# Patient Record
Sex: Female | Born: 1955 | Race: Black or African American | Hispanic: No | Marital: Single | Smoking: Never smoker
Health system: Southern US, Community
[De-identification: ages and names within clinical notes are randomized; demographics above are authoritative.]

## PROBLEM LIST (undated history)

## (undated) DIAGNOSIS — F329 Major depressive disorder, single episode, unspecified: Secondary | ICD-10-CM

## (undated) DIAGNOSIS — Z803 Family history of malignant neoplasm of breast: Secondary | ICD-10-CM

## (undated) DIAGNOSIS — I10 Essential (primary) hypertension: Secondary | ICD-10-CM

## (undated) DIAGNOSIS — F419 Anxiety disorder, unspecified: Secondary | ICD-10-CM

## (undated) DIAGNOSIS — R251 Tremor, unspecified: Secondary | ICD-10-CM

## (undated) DIAGNOSIS — Z801 Family history of malignant neoplasm of trachea, bronchus and lung: Secondary | ICD-10-CM

## (undated) DIAGNOSIS — Z87442 Personal history of urinary calculi: Secondary | ICD-10-CM

## (undated) DIAGNOSIS — Z8679 Personal history of other diseases of the circulatory system: Secondary | ICD-10-CM

## (undated) DIAGNOSIS — F410 Panic disorder [episodic paroxysmal anxiety] without agoraphobia: Secondary | ICD-10-CM

## (undated) DIAGNOSIS — C801 Malignant (primary) neoplasm, unspecified: Secondary | ICD-10-CM

## (undated) HISTORY — DX: Tremor, unspecified: R25.1

## (undated) HISTORY — PX: CHOLECYSTECTOMY: SHX55

## (undated) HISTORY — DX: Major depressive disorder, single episode, unspecified: F32.9

## (undated) HISTORY — DX: Essential (primary) hypertension: I10

## (undated) HISTORY — PX: TONSILLECTOMY: SUR1361

## (undated) HISTORY — PX: OTHER SURGICAL HISTORY: SHX169

## (undated) HISTORY — PX: APPENDECTOMY: SHX54

## (undated) HISTORY — DX: Panic disorder (episodic paroxysmal anxiety): F41.0

## (undated) HISTORY — DX: Family history of malignant neoplasm of trachea, bronchus and lung: Z80.1

## (undated) HISTORY — DX: Family history of malignant neoplasm of breast: Z80.3

## (undated) HISTORY — DX: Anxiety disorder, unspecified: F41.9

---

## 1993-10-13 DIAGNOSIS — F419 Anxiety disorder, unspecified: Secondary | ICD-10-CM

## 1993-10-13 DIAGNOSIS — F32A Depression, unspecified: Secondary | ICD-10-CM

## 1993-10-13 DIAGNOSIS — F410 Panic disorder [episodic paroxysmal anxiety] without agoraphobia: Secondary | ICD-10-CM

## 1993-10-13 HISTORY — DX: Depression, unspecified: F32.A

## 1993-10-13 HISTORY — DX: Panic disorder (episodic paroxysmal anxiety): F41.0

## 1993-10-13 HISTORY — DX: Anxiety disorder, unspecified: F41.9

## 2011-03-07 ENCOUNTER — Emergency Department (HOSPITAL_COMMUNITY): Payer: Self-pay

## 2011-03-07 ENCOUNTER — Emergency Department (HOSPITAL_COMMUNITY)
Admission: EM | Admit: 2011-03-07 | Discharge: 2011-03-08 | Disposition: A | Discharge status: Home / Self Care | Payer: Self-pay | Attending: Emergency Medicine | Admitting: Emergency Medicine

## 2011-03-07 DIAGNOSIS — N390 Urinary tract infection, site not specified: Secondary | ICD-10-CM | POA: Insufficient documentation

## 2011-03-07 DIAGNOSIS — R1011 Right upper quadrant pain: Secondary | ICD-10-CM | POA: Insufficient documentation

## 2011-03-07 DIAGNOSIS — R112 Nausea with vomiting, unspecified: Secondary | ICD-10-CM | POA: Insufficient documentation

## 2011-03-07 DIAGNOSIS — K5730 Diverticulosis of large intestine without perforation or abscess without bleeding: Secondary | ICD-10-CM | POA: Insufficient documentation

## 2011-03-07 DIAGNOSIS — Z87442 Personal history of urinary calculi: Secondary | ICD-10-CM | POA: Insufficient documentation

## 2011-03-07 DIAGNOSIS — K7689 Other specified diseases of liver: Secondary | ICD-10-CM | POA: Insufficient documentation

## 2011-03-07 LAB — CBC
HCT: 43.8 % (ref 36.0–46.0)
Hemoglobin: 14.5 g/dL (ref 12.0–15.0)
MCV: 89.8 fL (ref 78.0–100.0)
RBC: 4.88 MIL/uL (ref 3.87–5.11)
WBC: 11.4 10*3/uL — ABNORMAL HIGH (ref 4.0–10.5)

## 2011-03-07 LAB — URINALYSIS, ROUTINE W REFLEX MICROSCOPIC
Bilirubin Urine: NEGATIVE
Glucose, UA: NEGATIVE mg/dL
Nitrite: NEGATIVE
Specific Gravity, Urine: 1.015 (ref 1.005–1.030)
pH: 5.5 (ref 5.0–8.0)

## 2011-03-07 LAB — URINE MICROSCOPIC-ADD ON

## 2011-03-07 LAB — DIFFERENTIAL
Basophils Absolute: 0 10*3/uL (ref 0.0–0.1)
Lymphocytes Relative: 11 % — ABNORMAL LOW (ref 12–46)
Lymphs Abs: 1.2 10*3/uL (ref 0.7–4.0)
Neutro Abs: 9.6 10*3/uL — ABNORMAL HIGH (ref 1.7–7.7)

## 2011-03-07 LAB — BASIC METABOLIC PANEL
CO2: 25 mEq/L (ref 19–32)
Chloride: 103 mEq/L (ref 96–112)
GFR calc non Af Amer: >60 mL/min (ref >60)
Glucose, Bld: 118 mg/dL — ABNORMAL HIGH (ref 70–99)
Potassium: 4.5 mEq/L (ref 3.5–5.1)
Sodium: 140 mEq/L (ref 135–145)

## 2011-03-10 LAB — URINE CULTURE

## 2014-08-03 ENCOUNTER — Encounter (HOSPITAL_COMMUNITY): Payer: Self-pay | Admitting: Emergency Medicine

## 2014-08-03 ENCOUNTER — Emergency Department (HOSPITAL_COMMUNITY)
Admission: EM | Admit: 2014-08-03 | Discharge: 2014-08-03 | Disposition: A | Discharge status: Home / Self Care | Payer: Self-pay | Attending: Emergency Medicine | Admitting: Emergency Medicine

## 2014-08-03 DIAGNOSIS — F419 Anxiety disorder, unspecified: Secondary | ICD-10-CM | POA: Insufficient documentation

## 2014-08-03 DIAGNOSIS — K625 Hemorrhage of anus and rectum: Secondary | ICD-10-CM | POA: Insufficient documentation

## 2014-08-03 DIAGNOSIS — R000 Tachycardia, unspecified: Secondary | ICD-10-CM | POA: Insufficient documentation

## 2014-08-03 DIAGNOSIS — Z79899 Other long term (current) drug therapy: Secondary | ICD-10-CM | POA: Insufficient documentation

## 2014-08-03 LAB — COMPREHENSIVE METABOLIC PANEL
ALT: 14 U/L (ref 0–35)
ANION GAP: 15 (ref 5–15)
AST: 18 U/L (ref 0–37)
Albumin: 4.3 g/dL (ref 3.5–5.2)
Alkaline Phosphatase: 79 U/L (ref 39–117)
BUN: 14 mg/dL (ref 6–23)
CALCIUM: 9.9 mg/dL (ref 8.4–10.5)
CO2: 22 meq/L (ref 19–32)
CREATININE: 0.87 mg/dL (ref 0.50–1.10)
Chloride: 103 mEq/L (ref 96–112)
GFR, EST AFRICAN AMERICAN: 83 mL/min — AB (ref >90)
GFR, EST NON AFRICAN AMERICAN: 72 mL/min — AB (ref >90)
GLUCOSE: 106 mg/dL — AB (ref 70–99)
Potassium: 4.3 mEq/L (ref 3.7–5.3)
SODIUM: 140 meq/L (ref 137–147)
TOTAL PROTEIN: 8.3 g/dL (ref 6.0–8.3)
Total Bilirubin: 0.4 mg/dL (ref 0.3–1.2)

## 2014-08-03 LAB — CBC WITH DIFFERENTIAL/PLATELET
BASOS ABS: 0 10*3/uL (ref 0.0–0.1)
BASOS PCT: 0 % (ref 0–1)
EOS ABS: 0.1 10*3/uL (ref 0.0–0.7)
Eosinophils Relative: 1 % (ref 0–5)
HCT: 43.8 % (ref 36.0–46.0)
HEMOGLOBIN: 15 g/dL (ref 12.0–15.0)
Lymphocytes Relative: 23 % (ref 12–46)
Lymphs Abs: 1.8 10*3/uL (ref 0.7–4.0)
MCH: 30.5 pg (ref 26.0–34.0)
MCHC: 34.2 g/dL (ref 30.0–36.0)
MCV: 89.2 fL (ref 78.0–100.0)
MONOS PCT: 7 % (ref 3–12)
Monocytes Absolute: 0.5 10*3/uL (ref 0.1–1.0)
NEUTROS ABS: 5.4 10*3/uL (ref 1.7–7.7)
NEUTROS PCT: 69 % (ref 43–77)
PLATELETS: 420 10*3/uL — AB (ref 150–400)
RBC: 4.91 MIL/uL (ref 3.87–5.11)
RDW: 13.3 % (ref 11.5–15.5)
WBC: 7.9 10*3/uL (ref 4.0–10.5)

## 2014-08-03 LAB — TYPE AND SCREEN
ABO/RH(D): B NEG
ANTIBODY SCREEN: NEGATIVE

## 2014-08-03 LAB — PROTIME-INR
INR: 1.02 (ref 0.00–1.49)
PROTHROMBIN TIME: 13.5 s (ref 11.6–15.2)

## 2014-08-03 LAB — POC OCCULT BLOOD, ED: FECAL OCCULT BLD: POSITIVE — AB

## 2014-08-03 LAB — ABO/RH: ABO/RH(D): B NEG

## 2014-08-03 NOTE — ED Notes (Signed)
Pt c/o BRB rectal bleeding x 1 episode this am; pt denies pain

## 2014-08-03 NOTE — ED Provider Notes (Signed)
Medical screening examination/treatment/procedure(s) were performed by non-physician practitioner and as supervising physician I was immediately available for consultation/collaboration.   EKG Interpretation None        Hoy Morn, MD 08/03/14 1729

## 2014-08-03 NOTE — Discharge Instructions (Signed)
Bloody Stools  Bloody stools often mean that there is a problem in the digestive tract. Your caregiver may use the term "melena" to describe black, tarry, and bad smelling stools or "hematochezia" to describe red or maroon-colored stools. Blood seen in the stool can be caused by bleeding anywhere along the intestinal tract.   A black stool usually means that blood is coming from the upper part of the gastrointestinal tract (esophagus, stomach, or small bowel). Passing maroon-colored stools or bright red blood usually means that blood is coming from lower down in the large bowel or the rectum. However, sometimes massive bleeding in the stomach or small intestine can cause bright red bloody stools.   Consuming black licorice, lead, iron pills, medicines containing bismuth subsalicylate, or blueberries can also cause black stools. Your caregiver can test black stools to see if blood is present.  It is important that the cause of the bleeding be found. Treatment can then be started, and the problem can be corrected. Rectal bleeding may not be serious, but you should not assume everything is okay until you know the cause. It is very important to follow up with your caregiver or a specialist in gastrointestinal problems.  CAUSES   Blood in the stools can come from various underlying causes. Often, the cause is not found during your first visit. Testing is often needed to discover the cause of bleeding in the gastrointestinal tract. Causes range from simple to serious or even life-threatening. Possible causes include:  · Hemorrhoids. These are veins that are full of blood (engorged) in the rectum. They cause pain, inflammation, and may bleed.  · Anal fissures. These are areas of painful tearing which may bleed. They are often caused by passing hard stool.  · Diverticulosis. These are pouches that form on the colon over time, with age, and may bleed significantly.  · Diverticulitis. This is inflammation in areas with  diverticulosis. It can cause pain, fever, and bloody stools, although bleeding is rare.  · Proctitis and colitis. These are inflamed areas of the rectum or colon. They may cause pain, fever, and bloody stools.  · Polyps and cancer. Colon cancer is a leading cause of preventable cancer death. It often starts out as precancerous polyps that can be removed during a colonoscopy, preventing progression into cancer. Sometimes, polyps and cancer may cause rectal bleeding.  · Gastritis and ulcers. Bleeding from the upper gastrointestinal tract (near the stomach) may travel through the intestines and produce black, sometimes tarry, often bad smelling stools. In certain cases, if the bleeding is fast enough, the stools may not be black, but red and the condition may be life-threatening.  SYMPTOMS   You may have stools that are bright red and bloody, that are normal color with blood on them, or that are dark black and tarry. In some cases, you may only have blood in the toilet bowl. Any of these cases need medical care. You may also have:  · Pain at the anus or anywhere in the rectum.  · Lightheadedness or feeling faint.  · Extreme weakness.  · Nausea or vomiting.  · Fever.  DIAGNOSIS  Your caregiver may use the following methods to find the cause of your bleeding:  · Taking a medical history. Age is important. Older people tend to develop polyps and cancer more often. If there is anal pain and a hard, large stool associated with bleeding, a tear of the anus may be the cause. If blood drips into the toilet after a bowel movement, bleeding hemorrhoids may be the   problem. The color and frequency of the bleeding are additional considerations. In most cases, the medical history provides clues, but seldom the final answer.  · A visual and finger (digital) exam. Your caregiver will inspect the anal area, looking for tears and hemorrhoids. A finger exam can provide information when there is tenderness or a growth inside. In men, the  prostate is also examined.  · Endoscopy. Several types of small, long scopes (endoscopes) are used to view the colon.  ¨ In the office, your caregiver may use a rigid, or more commonly, a flexible viewing sigmoidoscope. This exam is called flexible sigmoidoscopy. It is performed in 5 to 10 minutes.  ¨ A more thorough exam is accomplished with a colonoscope. It allows your caregiver to view the entire 5 to 6 foot long colon. Medicine to help you relax (sedative) is usually given for this exam. Frequently, a bleeding lesion may be present beyond the reach of the sigmoidoscope. So, a colonoscopy may be the best exam to start with. Both exams are usually done on an outpatient basis. This means the patient does not stay overnight in the hospital or surgery center.  ¨ An upper endoscopy may be needed to examine your stomach. Sedation is used and a flexible endoscope is put in your mouth, down to your stomach.  · A barium enema X-ray. This is an X-ray exam. It uses liquid barium inserted by enema into the rectum. This test alone may not identify an actual bleeding point. X-rays highlight abnormal shadows, such as those made by lumps (tumors), diverticuli, or colitis.  TREATMENT   Treatment depends on the cause of your bleeding.   · For bleeding from the stomach or colon, the caregiver doing your endoscopy or colonoscopy may be able to stop the bleeding as part of the procedure.  · Inflammation or infection of the colon can be treated with medicines.  · Many rectal problems can be treated with creams, suppositories, or warm baths.  · Surgery is sometimes needed.  · Blood transfusions are sometimes needed if you have lost a lot of blood.  · For any bleeding problem, let your caregiver know if you take aspirin or other blood thinners regularly.  HOME CARE INSTRUCTIONS   · Take any medicines exactly as prescribed.  · Keep your stools soft by eating a diet high in fiber. Prunes (1 to 3 a day) work well for many people.  · Drink  enough water and fluids to keep your urine clear or pale yellow.  · Take sitz baths if advised. A sitz bath is when you sit in a bathtub with warm water for 10 to 15 minutes to soak, soothe, and cleanse the rectal area.  · If enemas or suppositories are advised, be sure you know how to use them. Tell your caregiver if you have problems with this.  · Monitor your bowel movements to look for signs of improvement or worsening.  SEEK MEDICAL CARE IF:   · You do not improve in the time expected.  · Your condition worsens after initial improvement.  · You develop any new symptoms.  SEEK IMMEDIATE MEDICAL CARE IF:   · You develop severe or prolonged rectal bleeding.  · You vomit blood.  · You feel weak or faint.  · You have a fever.  MAKE SURE YOU:  · Understand these instructions.  · Will watch your condition.  · Will get help right away if you are not doing well or get worse.    Document Released: 09/19/2002 Document Revised: 12/22/2011 Document Reviewed: 02/14/2011  ExitCare® Patient Information ©2015 [employer]. This information is not intended to replace advice given to you by your health care provider. Make sure you discuss any questions you have with your health care provider.

## 2014-08-03 NOTE — ED Provider Notes (Signed)
CSN: [id number]     Arrival date & time 08/03/14  1019 History   First MD Initiated Contact with Patient 08/03/14 1120     Chief Complaint  Patient presents with  . Rectal Bleeding     (Consider location/radiation/quality/duration/timing/severity/associated sxs/prior Treatment) HPI  She presents to the emergency department with complaints of an episode of bloody stool this morning. She reports having a history of constipation and this morning straining for her bowel movement and saw blood in the toilet bowl "a good amount" and blood with wiping that was bright red. She described this as painless. She is not having abdominal pain or vomiting. She described the stool as hard. She denies this ever happening to her before. She does not have a primary care doctor. Patient appears anxious she tells me this is because of her nerves. She denies taking any medications on a daily basis or having a past medical history. Vitals stable in triage  History reviewed. No pertinent past medical history. History reviewed. No pertinent past surgical history. History reviewed. No pertinent family history. History  Substance Use Topics  . Smoking status: Never Smoker   . Smokeless tobacco: Not on file  . Alcohol Use: No   OB History   Grav Para Term Preterm Abortions TAB SAB Ect Mult Living                 Review of Systems  All other systems reviewed and are negative.     Allergies  Codeine  Home Medications   Prior to Admission medications   Medication Sig Start Date End Date Taking? Authorizing Provider  Multiple Vitamin (MULTIVITAMIN WITH MINERALS) TABS tablet Take 1 tablet by mouth daily.   Yes Historical Provider, MD   BP 129/90  Pulse 107  Temp(Src) 98.3 F (36.8 C) (Oral)  Resp 18  SpO2 100% Physical Exam  Nursing note and vitals reviewed. Constitutional: She appears well-developed and well-nourished. No distress.  HENT:  Head: Normocephalic and atraumatic.  Eyes: Pupils are  equal, round, and reactive to light.  Neck: Normal range of motion. Neck supple.  Cardiovascular: Regular rhythm.  Tachycardia present.   Pulmonary/Chest: Effort normal.  Abdominal: Soft. Bowel sounds are normal. There is no tenderness. There is no rebound, no guarding and no CVA tenderness.  Genitourinary: Rectal exam shows internal hemorrhoid. Rectal exam shows no external hemorrhoid, no fissure, no mass, no tenderness and anal tone normal. Guaiac positive stool.  Neurological: She is alert.  Skin: Skin is warm and dry.  Psychiatric: Her mood appears anxious. She does not exhibit a depressed mood.    ED Course  Procedures (including critical care time) Labs Review Labs Reviewed  COMPREHENSIVE METABOLIC PANEL - Abnormal; Notable for the following:    Glucose, Bld 106 (*)    GFR calc non Af Amer 72 (*)    GFR calc Af Amer 83 (*)    All other components within normal limits  CBC WITH DIFFERENTIAL - Abnormal; Notable for the following:    Platelets 420 (*)    All other components within normal limits  POC OCCULT BLOOD, ED - Abnormal; Notable for the following:    Fecal Occult Bld POSITIVE (*)    All other components within normal limits  PROTIME-INR  OCCULT BLOOD X 1 CARD TO LAB, STOOL  TYPE AND SCREEN  ABO/RH    Imaging Review No results found.   EKG Interpretation None      MDM   Final diagnoses:  Rectal bleeding  Patient is well-appearing. Is not actively passing blood in the emergency department. Report having another bowel movement in the ED and there was no blood in the bowl or on the tissue. The hemoglobin is 15.0 and her PT/INR within normal limits. CMP is otherwise unremarkable. Positive fecal occult. Discussed her needing to see a primary care Dr. for routine maintenance. Also recommended she follow up with a gastroenterologist. Discussed return precautions, active bleeding, more large blood bowel movements or weakness/fatigue/SOB. Discussed case with Dr.  Venora Maples prior to DC.  58 y.o.Demara Covault's evaluation in the Emergency Department is complete. It has been determined that no acute conditions requiring further emergency intervention are present at this time. The patient/guardian have been advised of the diagnosis and plan. We have discussed signs and symptoms that warrant return to the ED, such as changes or worsening in symptoms.  Vital signs are stable at discharge. Filed Vitals:   08/03/14 1255  BP: 129/90  Pulse: 107  Temp:   Resp:     Patient/guardian has voiced understanding and agreed to follow-up with the PCP or specialist.     Linus Mako, PA-C 08/03/14 1323

## 2014-08-16 ENCOUNTER — Encounter: Payer: Self-pay | Admitting: Internal Medicine

## 2014-08-16 ENCOUNTER — Ambulatory Visit: Payer: Self-pay | Attending: Internal Medicine | Admitting: Internal Medicine

## 2014-08-16 VITALS — BP 145/94 | HR 88 | Temp 97.8°F | Resp 16 | Wt 170.0 lb

## 2014-08-16 DIAGNOSIS — R195 Other fecal abnormalities: Secondary | ICD-10-CM | POA: Insufficient documentation

## 2014-08-16 DIAGNOSIS — IMO0001 Reserved for inherently not codable concepts without codable children: Secondary | ICD-10-CM

## 2014-08-16 DIAGNOSIS — Z1211 Encounter for screening for malignant neoplasm of colon: Secondary | ICD-10-CM

## 2014-08-16 DIAGNOSIS — R030 Elevated blood-pressure reading, without diagnosis of hypertension: Secondary | ICD-10-CM | POA: Insufficient documentation

## 2014-08-16 DIAGNOSIS — Z139 Encounter for screening, unspecified: Secondary | ICD-10-CM

## 2014-08-16 DIAGNOSIS — I10 Essential (primary) hypertension: Secondary | ICD-10-CM | POA: Insufficient documentation

## 2014-08-16 LAB — LIPID PANEL
CHOLESTEROL: 182 mg/dL (ref 0–200)
HDL: 59 mg/dL (ref >39)
LDL Cholesterol: 100 mg/dL — ABNORMAL HIGH (ref 0–99)
TRIGLYCERIDES: 115 mg/dL (ref <150)
Total CHOL/HDL Ratio: 3.1 Ratio
VLDL: 23 mg/dL (ref 0–40)

## 2014-08-16 LAB — CBC WITH DIFFERENTIAL/PLATELET
BASOS PCT: 1 % (ref 0–1)
Basophils Absolute: 0.1 10*3/uL (ref 0.0–0.1)
EOS ABS: 0.1 10*3/uL (ref 0.0–0.7)
EOS PCT: 1 % (ref 0–5)
HCT: 45.3 % (ref 36.0–46.0)
Hemoglobin: 15.4 g/dL — ABNORMAL HIGH (ref 12.0–15.0)
LYMPHS ABS: 2.5 10*3/uL (ref 0.7–4.0)
Lymphocytes Relative: 33 % (ref 12–46)
MCH: 31 pg (ref 26.0–34.0)
MCHC: 34 g/dL (ref 30.0–36.0)
MCV: 91.3 fL (ref 78.0–100.0)
Monocytes Absolute: 0.6 10*3/uL (ref 0.1–1.0)
Monocytes Relative: 8 % (ref 3–12)
NEUTROS PCT: 57 % (ref 43–77)
Neutro Abs: 4.3 10*3/uL (ref 1.7–7.7)
PLATELETS: 465 10*3/uL — AB (ref 150–400)
RBC: 4.96 MIL/uL (ref 3.87–5.11)
RDW: 13.8 % (ref 11.5–15.5)
WBC: 7.6 10*3/uL (ref 4.0–10.5)

## 2014-08-16 LAB — COMPLETE METABOLIC PANEL WITH GFR
ALK PHOS: 72 U/L (ref 39–117)
ALT: 13 U/L (ref 0–35)
AST: 19 U/L (ref 0–37)
Albumin: 4.9 g/dL (ref 3.5–5.2)
BILIRUBIN TOTAL: 0.5 mg/dL (ref 0.2–1.2)
BUN: 10 mg/dL (ref 6–23)
CO2: 26 meq/L (ref 19–32)
CREATININE: 0.87 mg/dL (ref 0.50–1.10)
Calcium: 10.4 mg/dL (ref 8.4–10.5)
Chloride: 104 mEq/L (ref 96–112)
GFR, EST AFRICAN AMERICAN: 84 mL/min
GFR, EST NON AFRICAN AMERICAN: 73 mL/min
GLUCOSE: 90 mg/dL (ref 70–99)
Potassium: 5.1 mEq/L (ref 3.5–5.3)
Sodium: 141 mEq/L (ref 135–145)
Total Protein: 8 g/dL (ref 6.0–8.3)

## 2014-08-16 NOTE — Patient Instructions (Signed)
DASH Eating Plan °DASH stands for "Dietary Approaches to Stop Hypertension." The DASH eating plan is a healthy eating plan that has been shown to reduce high blood pressure (hypertension). Additional health benefits may include reducing the risk of type 2 diabetes mellitus, heart disease, and stroke. The DASH eating plan may also help with weight loss. °WHAT DO I NEED TO KNOW ABOUT THE DASH EATING PLAN? °For the DASH eating plan, you will follow these general guidelines: °· Choose foods with a percent daily value for sodium of less than 5% (as listed on the food label). °· Use salt-free seasonings or herbs instead of table salt or sea salt. °· Check with your health care provider or pharmacist before using salt substitutes. °· Eat lower-sodium products, often labeled as "lower sodium" or "no salt added." °· Eat fresh foods. °· Eat more vegetables, fruits, and low-fat dairy products. °· Choose whole grains. Look for the word "whole" as the first word in the ingredient list. °· Choose fish and skinless chicken or turkey more often than red meat. Limit fish, poultry, and meat to 6 oz (170 g) each day. °· Limit sweets, desserts, sugars, and sugary drinks. °· Choose heart-healthy fats. °· Limit cheese to 1 oz (28 g) per day. °· Eat more home-cooked food and less restaurant, buffet, and fast food. °· Limit fried foods. °· Cook foods using methods other than frying. °· Limit canned vegetables. If you do use them, rinse them well to decrease the sodium. °· When eating at a restaurant, ask that your food be prepared with less salt, or no salt if possible. °WHAT FOODS CAN I EAT? °Seek help from a dietitian for individual calorie needs. °Grains °Whole grain or whole wheat bread. Brown rice. Whole grain or whole wheat pasta. Quinoa, bulgur, and whole grain cereals. Low-sodium cereals. Corn or whole wheat flour tortillas. Whole grain cornbread. Whole grain crackers. Low-sodium crackers. °Vegetables °Fresh or frozen vegetables  (raw, steamed, roasted, or grilled). Low-sodium or reduced-sodium tomato and vegetable juices. Low-sodium or reduced-sodium tomato sauce and paste. Low-sodium or reduced-sodium canned vegetables.  °Fruits °All fresh, canned (in natural juice), or frozen fruits. °Meat and Other Protein Products °Ground beef (85% or leaner), grass-fed beef, or beef trimmed of fat. Skinless chicken or turkey. Ground chicken or turkey. Pork trimmed of fat. All fish and seafood. Eggs. Dried beans, peas, or lentils. Unsalted nuts and seeds. Unsalted canned beans. °Dairy °Low-fat dairy products, such as skim or 1% milk, 2% or reduced-fat cheeses, low-fat ricotta or cottage cheese, or plain low-fat yogurt. Low-sodium or reduced-sodium cheeses. °Fats and Oils °Tub margarines without trans fats. Light or reduced-fat mayonnaise and salad dressings (reduced sodium). Avocado. Safflower, olive, or canola oils. Natural peanut or almond butter. °Other °Unsalted popcorn and pretzels. °The items listed above may not be a complete list of recommended foods or beverages. Contact your dietitian for more options. °WHAT FOODS ARE NOT RECOMMENDED? °Grains °White bread. White pasta. White rice. Refined cornbread. Bagels and croissants. Crackers that contain trans fat. °Vegetables °Creamed or fried vegetables. Vegetables in a cheese sauce. Regular canned vegetables. Regular canned tomato sauce and paste. Regular tomato and vegetable juices. °Fruits °Dried fruits. Canned fruit in light or heavy syrup. Fruit juice. °Meat and Other Protein Products °Fatty cuts of meat. Ribs, chicken wings, bacon, sausage, bologna, salami, chitterlings, fatback, hot dogs, bratwurst, and packaged luncheon meats. Salted nuts and seeds. Canned beans with salt. °Dairy °Whole or 2% milk, cream, half-and-half, and cream cheese. Whole-fat or sweetened yogurt. Full-fat   cheeses or blue cheese. Nondairy creamers and whipped toppings. Processed cheese, cheese spreads, or cheese  curds. °Condiments °Onion and garlic salt, seasoned salt, table salt, and sea salt. Canned and packaged gravies. Worcestershire sauce. Tartar sauce. Barbecue sauce. Teriyaki sauce. Soy sauce, including reduced sodium. Steak sauce. Fish sauce. Oyster sauce. Cocktail sauce. Horseradish. Ketchup and mustard. Meat flavorings and tenderizers. Bouillon cubes. Hot sauce. Tabasco sauce. Marinades. Taco seasonings. Relishes. °Fats and Oils °Butter, stick margarine, lard, shortening, ghee, and bacon fat. Coconut, palm kernel, or palm oils. Regular salad dressings. °Other °Pickles and olives. Salted popcorn and pretzels. °The items listed above may not be a complete list of foods and beverages to avoid. Contact your dietitian for more information. °WHERE CAN I FIND MORE INFORMATION? °[employer], [employer], [employer]: www.nhlbi.nih.gov/health/health-topics/topics/dash/ °Document Released: 09/18/2011 Document Revised: 02/13/2014 Document Reviewed: 08/03/2013 °ExitCare® Patient Information ©2015 [employer]. This information is not intended to replace advice given to you by your health care provider. Make sure you discuss any questions you have with your health care provider. ° °

## 2014-08-16 NOTE — Progress Notes (Signed)
Patient here to establish care Was recently seen in the ed for blood in her stool Needs to be seen by the GI doctor to follow up

## 2014-08-16 NOTE — Progress Notes (Signed)
Patient Demographics  Kimberly [name], is a 58 y.o. female  XAJ:[id number]  CNO:[id number]  DOB - 07/29/1956  CC:  Chief Complaint  Patient presents with  . new patient       HPI: Kimberly [name] is a 58 y.o. female here today to establish medical care.patient recently went to the emergency room with symptoms of bright red blood per rectum, she was told that she has possible hemorrhoids, she denies any more bleeding, she never had a colonoscopy done today her blood pressure is borderline elevated, denies any headache dizziness chest and shortness of breath, patient declined for flu shot and had not had her Pap smear and mammogram done recently. Patient has No headache, No chest pain, No abdominal pain - No Nausea, No new weakness tingling or numbness, No Cough - SOB.  Allergies  Allergen Reactions  . Codeine Other (See Comments)    Reaction unknown   History reviewed. No pertinent past medical history. Current Outpatient Prescriptions on File Prior to Visit  Medication Sig Dispense Refill  . Multiple Vitamin (MULTIVITAMIN WITH MINERALS) TABS tablet Take 1 tablet by mouth daily.     No current facility-administered medications on file prior to visit.   Family History  Problem Relation Age of Onset  . Cancer Mother     breast cancer  . Cancer Maternal Grandmother   . Heart disease Paternal Grandfather    History   Social History  . Marital Status: Single    Spouse Name: N/A    Number of Children: N/A  . Years of Education: N/A   Occupational History  . Not on file.   Social History Main Topics  . Smoking status: Never Smoker   . Smokeless tobacco: Not on file  . Alcohol Use: No  . Drug Use: No  . Sexual Activity: Not on file   Other Topics Concern  . Not on file   Social History Narrative    Review of Systems: Constitutional: Negative for fever, chills, diaphoresis, activity change, appetite change and fatigue. HENT: Negative for ear pain,  nosebleeds, congestion, facial swelling, rhinorrhea, neck pain, neck stiffness and ear discharge.  Eyes: Negative for pain, discharge, redness, itching and visual disturbance. Respiratory: Negative for cough, choking, chest tightness, shortness of breath, wheezing and stridor.  Cardiovascular: Negative for chest pain, palpitations and leg swelling. Gastrointestinal: Negative for abdominal distention. Genitourinary: Negative for dysuria, urgency, frequency, hematuria, flank pain, decreased urine volume, difficulty urinating and dyspareunia.  Musculoskeletal: Negative for back pain, joint swelling, arthralgia and gait problem. Neurological: Negative for dizziness, tremors, seizures, syncope, facial asymmetry, speech difficulty, weakness, light-headedness, numbness and headaches.  Hematological: Negative for adenopathy. Does not bruise/bleed easily. Psychiatric/Behavioral: Negative for hallucinations, behavioral problems, confusion, dysphoric mood, decreased concentration and agitation.    Objective:   Filed Vitals:   08/16/14 1046  BP: 145/94  Pulse: 88  Temp: 97.8 F (36.6 C)  Resp: 16    Physical Exam: Constitutional: Patient appears well-developed and well-nourished. No distress. HENT: Normocephalic, atraumatic, External right and left ear normal. Oropharynx is clear and moist.  Eyes: Conjunctivae and EOM are normal. PERRLA, no scleral icterus. Neck: Normal ROM. Neck supple. No JVD. No tracheal deviation. No thyromegaly. CVS: RRR, S1/S2 +, no murmurs, no gallops, no carotid bruit.  Pulmonary: Effort and breath sounds normal, no stridor, rhonchi, wheezes, rales.  Abdominal: Soft. BS +, no distension, tenderness, rebound or guarding.  Musculoskeletal: Normal range of motion. No edema and no tenderness.  Neuro: Alert. Normal  reflexes, muscle tone coordination. No cranial nerve deficit. Skin: Skin is warm and dry. No rash noted. Not diaphoretic. No erythema. No pallor. Psychiatric:  Normal mood and affect. Behavior, judgment, thought content normal.  Lab Results  Component Value Date   WBC 7.9 08/03/2014   HGB 15.0 08/03/2014   HCT 43.8 08/03/2014   MCV 89.2 08/03/2014   PLT 420* 08/03/2014   Lab Results  Component Value Date   CREATININE 0.87 08/03/2014   BUN 14 08/03/2014   NA 140 08/03/2014   K 4.3 08/03/2014   CL 103 08/03/2014   CO2 22 08/03/2014    No results found for: HGBA1C Lipid Panel  No results found for: CHOL, TRIG, HDL, CHOLHDL, VLDL, LDLCALC     Assessment and plan:   1. Occult blood positive stool  - CBC with Differential - Ambulatory referral to Gastroenterology  2. Elevated BP Advise patient for DASH diet.  - COMPLETE METABOLIC PANEL WITH GFR  3. Special screening for malignant neoplasms, colon  - Ambulatory referral to Gastroenterology  4. Screening Ordered baseline blood work  - CBC with Differential - TSH - Lipid panel - Vit D  25 hydroxy (rtn osteoporosis monitoring) - Hemoglobin A1c - MM DIGITAL SCREENING BILATERAL; Future - Ambulatory referral to Gynecology        Health Maintenance -Colonoscopy: referred to GI -Pap Smear: referred to GYN -Mammogram: ordered  -Vaccinations: Patient declines flu shot   Return in about 3 months (around 11/16/2014).    Lorayne Marek, MD

## 2014-08-17 ENCOUNTER — Telehealth: Payer: Self-pay

## 2014-08-17 LAB — VITAMIN D 25 HYDROXY (VIT D DEFICIENCY, FRACTURES): Vit D, 25-Hydroxy: 55 ng/mL (ref 30–89)

## 2014-08-17 LAB — HEMOGLOBIN A1C
Hgb A1c MFr Bld: 6 % — ABNORMAL HIGH (ref <5.7)
MEAN PLASMA GLUCOSE: 126 mg/dL — AB (ref <117)

## 2014-08-17 LAB — TSH: TSH: 1.645 u[IU]/mL (ref 0.350–4.500)

## 2014-08-17 NOTE — Telephone Encounter (Signed)
-----   Message from Lorayne Marek, MD sent at 08/17/2014 10:29 AM EST ----- Blood work reviewed noticed hemoglobin A1c of 6.0%, patient has prediabetes, call and advise patient for low carbohydrate diet.

## 2014-08-17 NOTE — Telephone Encounter (Signed)
Patient phone message states " the person you are trying to reach Does not accept calls at this time" unable to leave voice mail

## 2014-08-25 ENCOUNTER — Ambulatory Visit: Payer: Self-pay | Attending: Family Medicine | Admitting: Family Medicine

## 2014-08-25 ENCOUNTER — Encounter: Payer: Self-pay | Admitting: Family Medicine

## 2014-08-25 VITALS — BP 149/98 | HR 94 | Temp 97.9°F | Resp 16 | Ht 65.0 in | Wt 170.0 lb

## 2014-08-25 DIAGNOSIS — Z124 Encounter for screening for malignant neoplasm of cervix: Secondary | ICD-10-CM | POA: Insufficient documentation

## 2014-08-25 DIAGNOSIS — IMO0001 Reserved for inherently not codable concepts without codable children: Secondary | ICD-10-CM

## 2014-08-25 DIAGNOSIS — F419 Anxiety disorder, unspecified: Secondary | ICD-10-CM | POA: Insufficient documentation

## 2014-08-25 DIAGNOSIS — R351 Nocturia: Secondary | ICD-10-CM

## 2014-08-25 DIAGNOSIS — R030 Elevated blood-pressure reading, without diagnosis of hypertension: Secondary | ICD-10-CM

## 2014-08-25 DIAGNOSIS — F411 Generalized anxiety disorder: Secondary | ICD-10-CM

## 2014-08-25 DIAGNOSIS — Z01419 Encounter for gynecological examination (general) (routine) without abnormal findings: Secondary | ICD-10-CM | POA: Insufficient documentation

## 2014-08-25 LAB — HEMOCCULT GUIAC POC 1CARD (OFFICE): Fecal Occult Blood, POC: NEGATIVE

## 2014-08-25 LAB — POCT URINALYSIS DIPSTICK
Bilirubin, UA: NEGATIVE
Blood, UA: NEGATIVE
GLUCOSE UA: NEGATIVE
Ketones, UA: NEGATIVE
NITRITE UA: NEGATIVE
Protein, UA: NEGATIVE
Spec Grav, UA: 1.015
UROBILINOGEN UA: 0.2
pH, UA: 8.5

## 2014-08-25 MED ORDER — AMLODIPINE BESYLATE 5 MG PO TABS: 5.0000 mg | ORAL_TABLET | Freq: Every day | ORAL | Status: DC

## 2014-08-25 NOTE — Progress Notes (Signed)
Annual pap and physical  

## 2014-08-25 NOTE — Assessment & Plan Note (Signed)
A: elevated BP x 3  P: Start norvasc 5 mg daily Continue low salt diet Add exercise  PCP f/u in 2-3 weeks

## 2014-08-25 NOTE — Assessment & Plan Note (Addendum)
A: patient reported nocturia. Will avoid thiazide diuretic for BP control. Obtain UA. P: Obtain UA and urine culture to rule out UTI  No liquids after 8 PM  Refer back to PCP for f/u eval and treat

## 2014-08-25 NOTE — Progress Notes (Signed)
   Subjective:    Patient ID: Kimberly [name], female    DOB: 1955-10-15, 58 y.o.   MRN: [id number] CC: pelvic exam with pap, breast exam PCP: Dr. Annitta Needs  HPI 58 yo F presents for the following:  1.  Pap smear: no history of abnormal paps. No vaginal bleeding, discharge or irritation.   2. Elevated BP: x 3 readings now. Patient is compliant with low salt diet. Patient does not exercise. Patient does have anxiety symptoms which have worsened over time. No CP, SOB, leg swelling.   Soc hx: chronic non smoker  Review of Systems As per HPI  Nocturia 2-3 times per night Anxiety     Objective:   Physical Exam BP 149/98 mmHg  Pulse 94  Temp(Src) 97.9 F (36.6 C) (Oral)  Resp 16  Ht 5\' 5"  (1.651 m)  Wt 170 lb (77.111 kg)  BMI 28.29 kg/m2  SpO2 98%  BP Readings from Last 3 Encounters:  08/25/14 149/98  08/16/14 145/94  08/03/14 121/95  General appearance: alert, cooperative and no distress  Lungs: normal WOB Breasts: normal appearance, no masses or tenderness Pelvic: cervix normal in appearance, external genitalia normal, no adnexal masses or tenderness, no cervical motion tenderness, rectovaginal septum normal, uterus normal size, shape, and consistency and vagina normal without discharge  Rectal: normal tone, no lesions. No gross blood. Hemoccult done and negative  Ext: no edema      Assessment & Plan:

## 2014-08-25 NOTE — Assessment & Plan Note (Signed)
Pap done today  

## 2014-08-25 NOTE — Assessment & Plan Note (Signed)
Negative stool card today

## 2014-08-25 NOTE — Patient Instructions (Signed)
Ms. Pinnix,   Thank you for coming in today. It was a pleasure meeting you. I look forward to being your primary doctor.  1. Elevated BP. Goal is < 140/90. Start norvasc 5 mg daily   2. Pap done today. Please schedule mammogram.   3. Peeing at night, UA done today. No fluids after 8 PM, avoid bubbly drinks, caffeine.   F/u in 2-3 weeks with PCP, Dr. Annitta Needs, for anxiety and nocturia follow up  Dr. Adrian Blackwater

## 2014-08-28 ENCOUNTER — Telehealth: Payer: Self-pay | Admitting: *Deleted

## 2014-08-28 LAB — CERVICOVAGINAL ANCILLARY ONLY
CHLAMYDIA, DNA PROBE: NEGATIVE
NEISSERIA GONORRHEA: NEGATIVE
WET PREP (BD AFFIRM): NEGATIVE
WET PREP (BD AFFIRM): NEGATIVE
WET PREP (BD AFFIRM): NEGATIVE

## 2014-08-28 LAB — URINE CULTURE

## 2014-08-28 NOTE — Telephone Encounter (Signed)
-----   Message from Minerva Ends, MD sent at 08/28/2014 10:01 AM EST ----- Negative urine culture

## 2014-08-28 NOTE — Telephone Encounter (Signed)
Pt aware of lab results 

## 2014-08-30 LAB — CYTOLOGY - PAP

## 2014-09-19 ENCOUNTER — Ambulatory Visit: Payer: Self-pay | Admitting: Family Medicine

## 2014-09-21 ENCOUNTER — Ambulatory Visit: Payer: Self-pay | Attending: Internal Medicine | Admitting: Internal Medicine

## 2014-09-21 ENCOUNTER — Ambulatory Visit (HOSPITAL_BASED_OUTPATIENT_CLINIC_OR_DEPARTMENT_OTHER): Payer: Self-pay

## 2014-09-21 ENCOUNTER — Encounter: Payer: Self-pay | Admitting: Internal Medicine

## 2014-09-21 VITALS — BP 130/80 | HR 102 | Temp 98.0°F | Resp 76 | Wt 170.0 lb

## 2014-09-21 DIAGNOSIS — Z0000 Encounter for general adult medical examination without abnormal findings: Secondary | ICD-10-CM

## 2014-09-21 DIAGNOSIS — Z23 Encounter for immunization: Secondary | ICD-10-CM

## 2014-09-21 DIAGNOSIS — Z79899 Other long term (current) drug therapy: Secondary | ICD-10-CM | POA: Insufficient documentation

## 2014-09-21 DIAGNOSIS — F411 Generalized anxiety disorder: Secondary | ICD-10-CM

## 2014-09-21 DIAGNOSIS — Z9049 Acquired absence of other specified parts of digestive tract: Secondary | ICD-10-CM | POA: Insufficient documentation

## 2014-09-21 DIAGNOSIS — F419 Anxiety disorder, unspecified: Secondary | ICD-10-CM | POA: Insufficient documentation

## 2014-09-21 DIAGNOSIS — I10 Essential (primary) hypertension: Secondary | ICD-10-CM | POA: Insufficient documentation

## 2014-09-21 MED ORDER — BUSPIRONE HCL 15 MG PO TABS: 15.0000 mg | ORAL_TABLET | Freq: Three times a day (TID) | ORAL | Status: DC

## 2014-09-21 NOTE — Patient Instructions (Signed)
DASH Eating Plan °DASH stands for "Dietary Approaches to Stop Hypertension." The DASH eating plan is a healthy eating plan that has been shown to reduce high blood pressure (hypertension). Additional health benefits may include reducing the risk of type 2 diabetes mellitus, heart disease, and stroke. The DASH eating plan may also help with weight loss. °WHAT DO I NEED TO KNOW ABOUT THE DASH EATING PLAN? °For the DASH eating plan, you will follow these general guidelines: °· Choose foods with a percent daily value for sodium of less than 5% (as listed on the food label). °· Use salt-free seasonings or herbs instead of table salt or sea salt. °· Check with your health care provider or pharmacist before using salt substitutes. °· Eat lower-sodium products, often labeled as "lower sodium" or "no salt added." °· Eat fresh foods. °· Eat more vegetables, fruits, and low-fat dairy products. °· Choose whole grains. Look for the word "whole" as the first word in the ingredient list. °· Choose fish and skinless chicken or turkey more often than red meat. Limit fish, poultry, and meat to 6 oz (170 g) each day. °· Limit sweets, desserts, sugars, and sugary drinks. °· Choose heart-healthy fats. °· Limit cheese to 1 oz (28 g) per day. °· Eat more home-cooked food and less restaurant, buffet, and fast food. °· Limit fried foods. °· Cook foods using methods other than frying. °· Limit canned vegetables. If you do use them, rinse them well to decrease the sodium. °· When eating at a restaurant, ask that your food be prepared with less salt, or no salt if possible. °WHAT FOODS CAN I EAT? °Seek help from a dietitian for individual calorie needs. °Grains °Whole grain or whole wheat bread. Brown rice. Whole grain or whole wheat pasta. Quinoa, bulgur, and whole grain cereals. Low-sodium cereals. Corn or whole wheat flour tortillas. Whole grain cornbread. Whole grain crackers. Low-sodium crackers. °Vegetables °Fresh or frozen vegetables  (raw, steamed, roasted, or grilled). Low-sodium or reduced-sodium tomato and vegetable juices. Low-sodium or reduced-sodium tomato sauce and paste. Low-sodium or reduced-sodium canned vegetables.  °Fruits °All fresh, canned (in natural juice), or frozen fruits. °Meat and Other Protein Products °Ground beef (85% or leaner), grass-fed beef, or beef trimmed of fat. Skinless chicken or turkey. Ground chicken or turkey. Pork trimmed of fat. All fish and seafood. Eggs. Dried beans, peas, or lentils. Unsalted nuts and seeds. Unsalted canned beans. °Dairy °Low-fat dairy products, such as skim or 1% milk, 2% or reduced-fat cheeses, low-fat ricotta or cottage cheese, or plain low-fat yogurt. Low-sodium or reduced-sodium cheeses. °Fats and Oils °Tub margarines without trans fats. Light or reduced-fat mayonnaise and salad dressings (reduced sodium). Avocado. Safflower, olive, or canola oils. Natural peanut or almond butter. °Other °Unsalted popcorn and pretzels. °The items listed above may not be a complete list of recommended foods or beverages. Contact your dietitian for more options. °WHAT FOODS ARE NOT RECOMMENDED? °Grains °White bread. White pasta. White rice. Refined cornbread. Bagels and croissants. Crackers that contain trans fat. °Vegetables °Creamed or fried vegetables. Vegetables in a cheese sauce. Regular canned vegetables. Regular canned tomato sauce and paste. Regular tomato and vegetable juices. °Fruits °Dried fruits. Canned fruit in light or heavy syrup. Fruit juice. °Meat and Other Protein Products °Fatty cuts of meat. Ribs, chicken wings, bacon, sausage, bologna, salami, chitterlings, fatback, hot dogs, bratwurst, and packaged luncheon meats. Salted nuts and seeds. Canned beans with salt. °Dairy °Whole or 2% milk, cream, half-and-half, and cream cheese. Whole-fat or sweetened yogurt. Full-fat   cheeses or blue cheese. Nondairy creamers and whipped toppings. Processed cheese, cheese spreads, or cheese  curds. °Condiments °Onion and garlic salt, seasoned salt, table salt, and sea salt. Canned and packaged gravies. Worcestershire sauce. Tartar sauce. Barbecue sauce. Teriyaki sauce. Soy sauce, including reduced sodium. Steak sauce. Fish sauce. Oyster sauce. Cocktail sauce. Horseradish. Ketchup and mustard. Meat flavorings and tenderizers. Bouillon cubes. Hot sauce. Tabasco sauce. Marinades. Taco seasonings. Relishes. °Fats and Oils °Butter, stick margarine, lard, shortening, ghee, and bacon fat. Coconut, palm kernel, or palm oils. Regular salad dressings. °Other °Pickles and olives. Salted popcorn and pretzels. °The items listed above may not be a complete list of foods and beverages to avoid. Contact your dietitian for more information. °WHERE CAN I FIND MORE INFORMATION? °[employer], [employer], [employer]: www.nhlbi.nih.gov/health/health-topics/topics/dash/ °Document Released: 09/18/2011 Document Revised: 02/13/2014 Document Reviewed: 08/03/2013 °ExitCare® Patient Information ©2015 [employer]. This information is not intended to replace advice given to you by your health care provider. Make sure you discuss any questions you have with your health care provider. ° °

## 2014-09-21 NOTE — Progress Notes (Signed)
MRN: [id number] Name: Kimberly [name]  Sex: female Age: 58 y.o. DOB: 1956/06/11  Allergies: Codeine  Chief Complaint  Patient presents with  . Follow-up    HPI: Patient is 58 y.o. female who history of hypertension currently taking Norvasc 5 mg daily, initially her blood pressure was elevated, repeat manual blood pressure is 130/80, she does report history of anxiety in the past and took BuSpar and Paxil which as per patient helped her with the symptoms and want to try the medication again because her anxiety symptoms are worse, she denies any SI or HI.  History reviewed. No pertinent past medical history.  Past Surgical History  Procedure Laterality Date  . Cholecystectomy        Medication List       This list is accurate as of: 09/21/14 12:37 PM.  Always use your most recent med list.               amLODipine 5 MG tablet  Commonly known as:  NORVASC  Take 1 tablet (5 mg total) by mouth daily.     busPIRone 15 MG tablet  Commonly known as:  BUSPAR  Take 1 tablet (15 mg total) by mouth 3 (three) times daily.     multivitamin with minerals Tabs tablet  Take 1 tablet by mouth daily.        Meds ordered this encounter  Medications  . busPIRone (BUSPAR) 15 MG tablet    Sig: Take 1 tablet (15 mg total) by mouth 3 (three) times daily.    Dispense:  90 tablet    Refill:  3     There is no immunization history on file for this patient.  Family History  Problem Relation Age of Onset  . Cancer Mother     breast cancer  . Cancer Maternal Grandmother   . Heart disease Paternal Grandfather     History  Substance Use Topics  . Smoking status: Never Smoker   . Smokeless tobacco: Not on file  . Alcohol Use: No    Review of Systems   As noted in HPI  Filed Vitals:   09/21/14 1227  BP: 130/80  Pulse:   Temp:   Resp:     Physical Exam  Physical Exam  Constitutional: No distress.  Eyes: EOM are normal. Pupils are equal, round, and reactive to  light.  Neck: Neck supple.  Cardiovascular: Regular rhythm.   tachycardic  Pulmonary/Chest: Breath sounds normal. No respiratory distress. She has no wheezes. She has no rales.  Musculoskeletal: She exhibits no edema.  Psychiatric:  anxious    CBC    Component Value Date/Time   WBC 7.6 08/16/2014 1212   RBC 4.96 08/16/2014 1212   HGB 15.4* 08/16/2014 1212   HCT 45.3 08/16/2014 1212   PLT 465* 08/16/2014 1212   MCV 91.3 08/16/2014 1212   LYMPHSABS 2.5 08/16/2014 1212   MONOABS 0.6 08/16/2014 1212   EOSABS 0.1 08/16/2014 1212   BASOSABS 0.1 08/16/2014 1212    CMP     Component Value Date/Time   NA 141 08/16/2014 1212   K 5.1 08/16/2014 1212   CL 104 08/16/2014 1212   CO2 26 08/16/2014 1212   GLUCOSE 90 08/16/2014 1212   BUN 10 08/16/2014 1212   CREATININE 0.87 08/16/2014 1212   CREATININE 0.87 08/03/2014 1200   CALCIUM 10.4 08/16/2014 1212   PROT 8.0 08/16/2014 1212   ALBUMIN 4.9 08/16/2014 1212   AST 19 08/16/2014 1212  ALT 13 08/16/2014 1212   ALKPHOS 72 08/16/2014 1212   BILITOT 0.5 08/16/2014 1212   GFRNONAA 73 08/16/2014 1212   GFRNONAA 72* 08/03/2014 1200   GFRAA 84 08/16/2014 1212   GFRAA 83* 08/03/2014 1200    Lab Results  Component Value Date/Time   CHOL 182 08/16/2014 12:12 PM    No components found for: HGA1C  Lab Results  Component Value Date/Time   AST 19 08/16/2014 12:12 PM    Assessment and Plan  Anxiety state - Plan:I prescribed her busPIRone (BUSPAR) 15 MG tablet, she has tried this medication in the past without any side effects.  Essential hypertension Advise patient for DASH diet, continue with Norvasc 5 mg daily.  Health Maintenance  -Vaccinations:  Patient declines flu shot   Return in about 3 months (around 12/21/2014) for hypertension, anxiety.  Lorayne Marek, MD

## 2014-09-21 NOTE — Progress Notes (Signed)
Patient here for follow up on her elevated blood pressure and anxiety Recently had a pap done with Dr Adrian Blackwater

## 2014-09-22 DIAGNOSIS — Z23 Encounter for immunization: Secondary | ICD-10-CM

## 2014-12-19 ENCOUNTER — Ambulatory Visit: Payer: Self-pay | Attending: Internal Medicine | Admitting: Internal Medicine

## 2014-12-19 ENCOUNTER — Ambulatory Visit: Payer: Self-pay | Admitting: Internal Medicine

## 2014-12-19 ENCOUNTER — Encounter: Payer: Self-pay | Admitting: Internal Medicine

## 2014-12-19 VITALS — BP 131/90 | HR 99 | Temp 98.7°F | Resp 15 | Wt 166.0 lb

## 2014-12-19 DIAGNOSIS — Z79899 Other long term (current) drug therapy: Secondary | ICD-10-CM | POA: Insufficient documentation

## 2014-12-19 DIAGNOSIS — Z791 Long term (current) use of non-steroidal anti-inflammatories (NSAID): Secondary | ICD-10-CM | POA: Insufficient documentation

## 2014-12-19 DIAGNOSIS — F419 Anxiety disorder, unspecified: Secondary | ICD-10-CM | POA: Insufficient documentation

## 2014-12-19 DIAGNOSIS — M79645 Pain in left finger(s): Secondary | ICD-10-CM | POA: Insufficient documentation

## 2014-12-19 DIAGNOSIS — I10 Essential (primary) hypertension: Secondary | ICD-10-CM | POA: Insufficient documentation

## 2014-12-19 DIAGNOSIS — F411 Generalized anxiety disorder: Secondary | ICD-10-CM

## 2014-12-19 LAB — COMPLETE METABOLIC PANEL WITH GFR
ALK PHOS: 88 U/L (ref 39–117)
ALT: 12 U/L (ref 0–35)
AST: 17 U/L (ref 0–37)
Albumin: 4.7 g/dL (ref 3.5–5.2)
BUN: 11 mg/dL (ref 6–23)
CHLORIDE: 104 meq/L (ref 96–112)
CO2: 25 mEq/L (ref 19–32)
CREATININE: 0.87 mg/dL (ref 0.50–1.10)
Calcium: 10.1 mg/dL (ref 8.4–10.5)
GFR, Est African American: 84 mL/min
GFR, Est Non African American: 73 mL/min
Glucose, Bld: 103 mg/dL — ABNORMAL HIGH (ref 70–99)
Potassium: 5 mEq/L (ref 3.5–5.3)
Sodium: 139 mEq/L (ref 135–145)
Total Bilirubin: 0.5 mg/dL (ref 0.2–1.2)
Total Protein: 7.6 g/dL (ref 6.0–8.3)

## 2014-12-19 MED ORDER — BUSPIRONE HCL 15 MG PO TABS: 15.0000 mg | ORAL_TABLET | Freq: Three times a day (TID) | ORAL | Status: DC

## 2014-12-19 MED ORDER — AMLODIPINE BESYLATE 5 MG PO TABS: 5.0000 mg | ORAL_TABLET | Freq: Every day | ORAL | Status: DC

## 2014-12-19 MED ORDER — IBUPROFEN 600 MG PO TABS: 600.0000 mg | ORAL_TABLET | Freq: Three times a day (TID) | ORAL | Status: DC | PRN

## 2014-12-19 NOTE — Progress Notes (Signed)
MRN: [id number] Name: Kimberly [name]  Sex: female Age: 59 y.o. DOB: 20-Aug-1956  Allergies: Codeine  Chief Complaint  Patient presents with  . Follow-up    HPI: Patient is 59 y.o. female who history of hypertension, anxiety comes today for followup  as per patient she is taking her medications regularly, blood pressure is well controlled she is on Norvasc, she's also taking BuSpar for anxiety, she reported to have noticed some lump on the dorsal aspect of right wrist which now is resolved but she has pain in her left hand fifth digit she denies any recent fall or trauma denies any numbness weakness.  History reviewed. No pertinent past medical history.  Past Surgical History  Procedure Laterality Date  . Cholecystectomy        Medication List       This list is accurate as of: 12/19/14  1:11 PM.  Always use your most recent med list.               amLODipine 5 MG tablet  Commonly known as:  NORVASC  Take 1 tablet (5 mg total) by mouth daily.     busPIRone 15 MG tablet  Commonly known as:  BUSPAR  Take 1 tablet (15 mg total) by mouth 3 (three) times daily.     ibuprofen 600 MG tablet  Commonly known as:  ADVIL,MOTRIN  Take 1 tablet (600 mg total) by mouth every 8 (eight) hours as needed.     multivitamin with minerals Tabs tablet  Take 1 tablet by mouth daily.        Meds ordered this encounter  Medications  . amLODipine (NORVASC) 5 MG tablet    Sig: Take 1 tablet (5 mg total) by mouth daily.    Dispense:  90 tablet    Refill:  3  . busPIRone (BUSPAR) 15 MG tablet    Sig: Take 1 tablet (15 mg total) by mouth 3 (three) times daily.    Dispense:  90 tablet    Refill:  3  . ibuprofen (ADVIL,MOTRIN) 600 MG tablet    Sig: Take 1 tablet (600 mg total) by mouth every 8 (eight) hours as needed.    Dispense:  30 tablet    Refill:  1    Immunization History  Administered Date(s) Administered  . Influenza,inj,Quad PF,36+ Mos 09/21/2014  . Pneumococcal  Polysaccharide-23 09/22/2014    Family History  Problem Relation Age of Onset  . Cancer Mother     breast cancer  . Cancer Maternal Grandmother   . Heart disease Paternal Grandfather     History  Substance Use Topics  . Smoking status: Never Smoker   . Smokeless tobacco: Not on file  . Alcohol Use: No    Review of Systems   As noted in HPI  Filed Vitals:   12/19/14 0925  BP: 131/90  Pulse: 99  Temp: 98.7 F (37.1 C)  Resp: 15    Physical Exam  Physical Exam  Eyes: EOM are normal. Pupils are equal, round, and reactive to light.  Cardiovascular: Normal rate and regular rhythm.   Pulmonary/Chest: Breath sounds normal. No respiratory distress. She has no wheezes. She has no rales.  Musculoskeletal:  Left hand fifth digit/finger minimal swelling no erythema, good range of motion    CBC    Component Value Date/Time   WBC 7.6 08/16/2014 1212   RBC 4.96 08/16/2014 1212   HGB 15.4* 08/16/2014 1212   HCT 45.3 08/16/2014 1212  PLT 465* 08/16/2014 1212   MCV 91.3 08/16/2014 1212   LYMPHSABS 2.5 08/16/2014 1212   MONOABS 0.6 08/16/2014 1212   EOSABS 0.1 08/16/2014 1212   BASOSABS 0.1 08/16/2014 1212    CMP     Component Value Date/Time   NA 141 08/16/2014 1212   K 5.1 08/16/2014 1212   CL 104 08/16/2014 1212   CO2 26 08/16/2014 1212   GLUCOSE 90 08/16/2014 1212   BUN 10 08/16/2014 1212   CREATININE 0.87 08/16/2014 1212   CREATININE 0.87 08/03/2014 1200   CALCIUM 10.4 08/16/2014 1212   PROT 8.0 08/16/2014 1212   ALBUMIN 4.9 08/16/2014 1212   AST 19 08/16/2014 1212   ALT 13 08/16/2014 1212   ALKPHOS 72 08/16/2014 1212   BILITOT 0.5 08/16/2014 1212   GFRNONAA 73 08/16/2014 1212   GFRNONAA 72* 08/03/2014 1200   GFRAA 84 08/16/2014 1212   GFRAA 83* 08/03/2014 1200    Lab Results  Component Value Date/Time   CHOL 182 08/16/2014 12:12 PM    No components found for: HGA1C  Lab Results  Component Value Date/Time   AST 19 08/16/2014 12:12 PM     Assessment and Plan  Essential hypertension - Plan: blood pressure is well controlled, continue with current meds amLODipine (NORVASC) 5 MG tablet, COMPLETE METABOLIC PANEL WITH GFR  Anxiety state - Plan:symptoms are stable continue with  busPIRone (BUSPAR) 15 MG tablet  Pain of finger of left hand - Plan: ibuprofen (ADVIL,MOTRIN) 600 MG tablet   Health Maintenance  -Vaccinations:  Up-to-date with flu shot and Pneumovax.  Return in about 3 months (around 03/21/2015), or if symptoms worsen or fail to improve, for hypertension, anxiety.   This note has been created with Surveyor, quantity. Any transcriptional errors are unintentional.    Lorayne Marek, MD

## 2014-12-19 NOTE — Progress Notes (Signed)
Patient here for follow up Complains of pain to her left pinky finger And lump to the top of her right hand

## 2014-12-19 NOTE — Patient Instructions (Signed)
DASH Eating Plan °DASH stands for "Dietary Approaches to Stop Hypertension." The DASH eating plan is a healthy eating plan that has been shown to reduce high blood pressure (hypertension). Additional health benefits may include reducing the risk of type 2 diabetes mellitus, heart disease, and stroke. The DASH eating plan may also help with weight loss. °WHAT DO I NEED TO KNOW ABOUT THE DASH EATING PLAN? °For the DASH eating plan, you will follow these general guidelines: °· Choose foods with a percent daily value for sodium of less than 5% (as listed on the food label). °· Use salt-free seasonings or herbs instead of table salt or sea salt. °· Check with your health care provider or pharmacist before using salt substitutes. °· Eat lower-sodium products, often labeled as "lower sodium" or "no salt added." °· Eat fresh foods. °· Eat more vegetables, fruits, and low-fat dairy products. °· Choose whole grains. Look for the word "whole" as the first word in the ingredient list. °· Choose fish and skinless chicken or turkey more often than red meat. Limit fish, poultry, and meat to 6 oz (170 g) each day. °· Limit sweets, desserts, sugars, and sugary drinks. °· Choose heart-healthy fats. °· Limit cheese to 1 oz (28 g) per day. °· Eat more home-cooked food and less restaurant, buffet, and fast food. °· Limit fried foods. °· Cook foods using methods other than frying. °· Limit canned vegetables. If you do use them, rinse them well to decrease the sodium. °· When eating at a restaurant, ask that your food be prepared with less salt, or no salt if possible. °WHAT FOODS CAN I EAT? °Seek help from a dietitian for individual calorie needs. °Grains °Whole grain or whole wheat bread. Brown rice. Whole grain or whole wheat pasta. Quinoa, bulgur, and whole grain cereals. Low-sodium cereals. Corn or whole wheat flour tortillas. Whole grain cornbread. Whole grain crackers. Low-sodium crackers. °Vegetables °Fresh or frozen vegetables  (raw, steamed, roasted, or grilled). Low-sodium or reduced-sodium tomato and vegetable juices. Low-sodium or reduced-sodium tomato sauce and paste. Low-sodium or reduced-sodium canned vegetables.  °Fruits °All fresh, canned (in natural juice), or frozen fruits. °Meat and Other Protein Products °Ground beef (85% or leaner), grass-fed beef, or beef trimmed of fat. Skinless chicken or turkey. Ground chicken or turkey. Pork trimmed of fat. All fish and seafood. Eggs. Dried beans, peas, or lentils. Unsalted nuts and seeds. Unsalted canned beans. °Dairy °Low-fat dairy products, such as skim or 1% milk, 2% or reduced-fat cheeses, low-fat ricotta or cottage cheese, or plain low-fat yogurt. Low-sodium or reduced-sodium cheeses. °Fats and Oils °Tub margarines without trans fats. Light or reduced-fat mayonnaise and salad dressings (reduced sodium). Avocado. Safflower, olive, or canola oils. Natural peanut or almond butter. °Other °Unsalted popcorn and pretzels. °The items listed above may not be a complete list of recommended foods or beverages. Contact your dietitian for more options. °WHAT FOODS ARE NOT RECOMMENDED? °Grains °White bread. White pasta. White rice. Refined cornbread. Bagels and croissants. Crackers that contain trans fat. °Vegetables °Creamed or fried vegetables. Vegetables in a cheese sauce. Regular canned vegetables. Regular canned tomato sauce and paste. Regular tomato and vegetable juices. °Fruits °Dried fruits. Canned fruit in light or heavy syrup. Fruit juice. °Meat and Other Protein Products °Fatty cuts of meat. Ribs, chicken wings, bacon, sausage, bologna, salami, chitterlings, fatback, hot dogs, bratwurst, and packaged luncheon meats. Salted nuts and seeds. Canned beans with salt. °Dairy °Whole or 2% milk, cream, half-and-half, and cream cheese. Whole-fat or sweetened yogurt. Full-fat   cheeses or blue cheese. Nondairy creamers and whipped toppings. Processed cheese, cheese spreads, or cheese  curds. °Condiments °Onion and garlic salt, seasoned salt, table salt, and sea salt. Canned and packaged gravies. Worcestershire sauce. Tartar sauce. Barbecue sauce. Teriyaki sauce. Soy sauce, including reduced sodium. Steak sauce. Fish sauce. Oyster sauce. Cocktail sauce. Horseradish. Ketchup and mustard. Meat flavorings and tenderizers. Bouillon cubes. Hot sauce. Tabasco sauce. Marinades. Taco seasonings. Relishes. °Fats and Oils °Butter, stick margarine, lard, shortening, ghee, and bacon fat. Coconut, palm kernel, or palm oils. Regular salad dressings. °Other °Pickles and olives. Salted popcorn and pretzels. °The items listed above may not be a complete list of foods and beverages to avoid. Contact your dietitian for more information. °WHERE CAN I FIND MORE INFORMATION? °[employer], [employer], [employer]: www.nhlbi.nih.gov/health/health-topics/topics/dash/ °Document Released: 09/18/2011 Document Revised: 02/13/2014 Document Reviewed: 08/03/2013 °ExitCare® Patient Information ©2015 [employer]. This information is not intended to replace advice given to you by your health care provider. Make sure you discuss any questions you have with your health care provider. ° °

## 2015-03-16 ENCOUNTER — Other Ambulatory Visit: Payer: Self-pay | Admitting: Internal Medicine

## 2015-03-26 ENCOUNTER — Ambulatory Visit: Payer: Self-pay | Attending: Internal Medicine | Admitting: Internal Medicine

## 2015-03-26 ENCOUNTER — Encounter: Payer: Self-pay | Admitting: Internal Medicine

## 2015-03-26 VITALS — BP 137/90 | HR 76 | Temp 98.0°F | Resp 16 | Wt 156.0 lb

## 2015-03-26 DIAGNOSIS — K029 Dental caries, unspecified: Secondary | ICD-10-CM

## 2015-03-26 DIAGNOSIS — F411 Generalized anxiety disorder: Secondary | ICD-10-CM

## 2015-03-26 DIAGNOSIS — R7303 Prediabetes: Secondary | ICD-10-CM

## 2015-03-26 DIAGNOSIS — H6121 Impacted cerumen, right ear: Secondary | ICD-10-CM

## 2015-03-26 DIAGNOSIS — R7309 Other abnormal glucose: Secondary | ICD-10-CM

## 2015-03-26 DIAGNOSIS — I10 Essential (primary) hypertension: Secondary | ICD-10-CM

## 2015-03-26 MED ORDER — BUSPIRONE HCL 15 MG PO TABS: 15.0000 mg | ORAL_TABLET | Freq: Three times a day (TID) | ORAL | Status: DC

## 2015-03-26 MED ORDER — AMLODIPINE BESYLATE 5 MG PO TABS: 5.0000 mg | ORAL_TABLET | Freq: Every day | ORAL | Status: DC

## 2015-03-26 MED ORDER — CARBAMIDE PEROXIDE 6.5 % OT SOLN: 5.0000 [drp] | Freq: Two times a day (BID) | OTIC | Status: DC

## 2015-03-26 NOTE — Patient Instructions (Addendum)
DASH Eating Plan DASH stands for "Dietary Approaches to Stop Hypertension." The DASH eating plan is a healthy eating plan that has been shown to reduce high blood pressure (hypertension). Additional health benefits may include reducing the risk of type 2 diabetes mellitus, heart disease, and stroke. The DASH eating plan may also help with weight loss. WHAT DO I NEED TO KNOW ABOUT THE DASH EATING PLAN? For the DASH eating plan, you will follow these general guidelines:  Choose foods with a percent daily value for sodium of less than 5% (as listed on the food label).  Use salt-free seasonings or herbs instead of table salt or sea salt.  Check with your health care provider or pharmacist before using salt substitutes.  Eat lower-sodium products, often labeled as "lower sodium" or "no salt added."  Eat fresh foods.  Eat more vegetables, fruits, and low-fat dairy products.  Choose whole grains. Look for the word "whole" as the first word in the ingredient list.  Choose fish and skinless chicken or turkey more often than red meat. Limit fish, poultry, and meat to 6 oz (170 g) each day.  Limit sweets, desserts, sugars, and sugary drinks.  Choose heart-healthy fats.  Limit cheese to 1 oz (28 g) per day.  Eat more home-cooked food and less restaurant, buffet, and fast food.  Limit fried foods.  Cook foods using methods other than frying.  Limit canned vegetables. If you do use them, rinse them well to decrease the sodium.  When eating at a restaurant, ask that your food be prepared with less salt, or no salt if possible. WHAT FOODS CAN I EAT? Seek help from a dietitian for individual calorie needs. Grains Whole grain or whole wheat bread. Brown rice. Whole grain or whole wheat pasta. Quinoa, bulgur, and whole grain cereals. Low-sodium cereals. Corn or whole wheat flour tortillas. Whole grain cornbread. Whole grain crackers. Low-sodium crackers. Vegetables Fresh or frozen vegetables  (raw, steamed, roasted, or grilled). Low-sodium or reduced-sodium tomato and vegetable juices. Low-sodium or reduced-sodium tomato sauce and paste. Low-sodium or reduced-sodium canned vegetables.  Fruits All fresh, canned (in natural juice), or frozen fruits. Meat and Other Protein Products Ground beef (85% or leaner), grass-fed beef, or beef trimmed of fat. Skinless chicken or turkey. Ground chicken or turkey. Pork trimmed of fat. All fish and seafood. Eggs. Dried beans, peas, or lentils. Unsalted nuts and seeds. Unsalted canned beans. Dairy Low-fat dairy products, such as skim or 1% milk, 2% or reduced-fat cheeses, low-fat ricotta or cottage cheese, or plain low-fat yogurt. Low-sodium or reduced-sodium cheeses. Fats and Oils Tub margarines without trans fats. Light or reduced-fat mayonnaise and salad dressings (reduced sodium). Avocado. Safflower, olive, or canola oils. Natural peanut or almond butter. Other Unsalted popcorn and pretzels. The items listed above may not be a complete list of recommended foods or beverages. Contact your dietitian for more options. WHAT FOODS ARE NOT RECOMMENDED? Grains White bread. White pasta. White rice. Refined cornbread. Bagels and croissants. Crackers that contain trans fat. Vegetables Creamed or fried vegetables. Vegetables in a cheese sauce. Regular canned vegetables. Regular canned tomato sauce and paste. Regular tomato and vegetable juices. Fruits Dried fruits. Canned fruit in light or heavy syrup. Fruit juice. Meat and Other Protein Products Fatty cuts of meat. Ribs, chicken wings, bacon, sausage, bologna, salami, chitterlings, fatback, hot dogs, bratwurst, and packaged luncheon meats. Salted nuts and seeds. Canned beans with salt. Dairy Whole or 2% milk, cream, half-and-half, and cream cheese. Whole-fat or sweetened yogurt. Full-fat   cheeses or blue cheese. Nondairy creamers and whipped toppings. Processed cheese, cheese spreads, or cheese  curds. Condiments Onion and garlic salt, seasoned salt, table salt, and sea salt. Canned and packaged gravies. Worcestershire sauce. Tartar sauce. Barbecue sauce. Teriyaki sauce. Soy sauce, including reduced sodium. Steak sauce. Fish sauce. Oyster sauce. Cocktail sauce. Horseradish. Ketchup and mustard. Meat flavorings and tenderizers. Bouillon cubes. Hot sauce. Tabasco sauce. Marinades. Taco seasonings. Relishes. Fats and Oils Butter, stick margarine, lard, shortening, ghee, and bacon fat. Coconut, palm kernel, or palm oils. Regular salad dressings. Other Pickles and olives. Salted popcorn and pretzels. The items listed above may not be a complete list of foods and beverages to avoid. Contact your dietitian for more information. WHERE CAN I FIND MORE INFORMATION? National Heart, Lung, and [employer] Institute: www.nhlbi.nih.gov/health/health-topics/topics/dash/ Document Released: 09/18/2011 Document Revised: 02/13/2014 Document Reviewed: 08/03/2013 Exit[employer] Patient Information 2015 [employer]. This information is not intended to replace advice given to you by your health care provider. Make sure you discuss any questions you have with your health care provider. Diabetes Mellitus and Food It is important for you to manage your blood sugar (glucose) level. Your blood glucose level can be greatly affected by what you eat. Eating healthier foods in the appropriate amounts throughout the day at about the same time each day will help you control your blood glucose level. It can also help slow or prevent worsening of your diabetes mellitus. Healthy eating may even help you improve the level of your blood pressure and reach or maintain a healthy weight.  HOW CAN FOOD AFFECT ME? Carbohydrates Carbohydrates affect your blood glucose level more than any other type of food. Your dietitian will help you determine how many carbohydrates to eat at each meal and teach you how to count carbohydrates. Counting  carbohydrates is important to keep your blood glucose at a healthy level, especially if you are using insulin or taking certain medicines for diabetes mellitus. Alcohol Alcohol can cause sudden decreases in blood glucose (hypoglycemia), especially if you use insulin or take certain medicines for diabetes mellitus. Hypoglycemia can be a life-threatening condition. Symptoms of hypoglycemia (sleepiness, dizziness, and disorientation) are similar to symptoms of having too much alcohol.  If your health care provider has given you approval to drink alcohol, do so in moderation and use the following guidelines:  Women should not have more than one drink per day, and men should not have more than two drinks per day. One drink is equal to:  12 oz of beer.  5 oz of wine.  1 oz of hard liquor.  Do not drink on an empty stomach.  Keep yourself hydrated. Have water, diet soda, or unsweetened iced tea.  Regular soda, juice, and other mixers might contain a lot of carbohydrates and should be counted. WHAT FOODS ARE NOT RECOMMENDED? As you make food choices, it is important to remember that all foods are not the same. Some foods have fewer nutrients per serving than other foods, even though they might have the same number of calories or carbohydrates. It is difficult to get your body what it needs when you eat foods with fewer nutrients. Examples of foods that you should avoid that are high in calories and carbohydrates but low in nutrients include:  Trans fats (most processed foods list trans fats on the Nutrition Facts label).  Regular soda.  Juice.  Candy.  Sweets, such as cake, pie, doughnuts, and cookies.  Fried foods. WHAT FOODS CAN I EAT? Have nutrient-rich foods,   which will nourish your body and keep you healthy. The food you should eat also will depend on several factors, including:  The calories you need.  The medicines you take.  Your weight.  Your blood glucose level.  Your  blood pressure level.  Your cholesterol level. You also should eat a variety of foods, including:  Protein, such as meat, poultry, fish, tofu, nuts, and seeds (lean animal proteins are best).  Fruits.  Vegetables.  Dairy products, such as milk, cheese, and yogurt (low fat is best).  Breads, grains, pasta, cereal, rice, and beans.  Fats such as olive oil, trans fat-free margarine, canola oil, avocado, and olives. DOES EVERYONE WITH DIABETES MELLITUS HAVE THE SAME MEAL PLAN? Because every person with diabetes mellitus is different, there is not one meal plan that works for everyone. It is very important that you meet with a dietitian who will help you create a meal plan that is just right for you. Document Released: 06/26/2005 Document Revised: 10/04/2013 Document Reviewed: 08/26/2013 Exit[employer] Patient Information 2015 [employer]. This information is not intended to replace advice given to you by your health care provider. Make sure you discuss any questions you have with your health care provider.  

## 2015-03-26 NOTE — Progress Notes (Signed)
Patient here for follow up on her hypertension Patient also complains of right ear pain Patient also requesting a referral to the dentist for tooth pain

## 2015-03-26 NOTE — Progress Notes (Signed)
MRN: [id number] Name: Kimberly [name]  Sex: female Age: 59 y.o. DOB: 02-Feb-1956  Allergies: Codeine  Chief Complaint  Patient presents with  . Follow-up    HPI: Patient is 59 y.o. female who has has history of anxiety, hypertension, comes today for followup and requesting refill on her medications she is also complaining of right ear fullness, patient has tried Q-tip, denies any ear pain or discharge, she's also complaining of having several dental cavities and is requesting referral to see a dentist, denies any sore throat chest and shortness of breath.also previous blood work reviewed noticed hemoglobin A1c of 6.0%, patient denies any family history of diabetes.  History reviewed. No pertinent past medical history.  Past Surgical History  Procedure Laterality Date  . Cholecystectomy        Medication List       This list is accurate as of: 03/26/15 11:18 AM.  Always use your most recent med list.               amLODipine 5 MG tablet  Commonly known as:  NORVASC  Take 1 tablet (5 mg total) by mouth daily.     busPIRone 15 MG tablet  Commonly known as:  BUSPAR  Take 1 tablet (15 mg total) by mouth 3 (three) times daily.     carbamide peroxide 6.5 % otic solution  Commonly known as:  DEBROX  Place 5 drops into the right ear 2 (two) times daily.     ibuprofen 600 MG tablet  Commonly known as:  ADVIL,MOTRIN  Take 1 tablet (600 mg total) by mouth every 8 (eight) hours as needed.     multivitamin with minerals Tabs tablet  Take 1 tablet by mouth daily.        Meds ordered this encounter  Medications  . amLODipine (NORVASC) 5 MG tablet    Sig: Take 1 tablet (5 mg total) by mouth daily.    Dispense:  90 tablet    Refill:  3  . busPIRone (BUSPAR) 15 MG tablet    Sig: Take 1 tablet (15 mg total) by mouth 3 (three) times daily.    Dispense:  90 tablet    Refill:  3  . carbamide peroxide (DEBROX) 6.5 % otic solution    Sig: Place 5 drops into the right ear 2  (two) times daily.    Dispense:  15 mL    Refill:  1    Immunization History  Administered Date(s) Administered  . Influenza,inj,Quad PF,36+ Mos 09/21/2014  . Pneumococcal Polysaccharide-23 09/22/2014    Family History  Problem Relation Age of Onset  . Cancer Mother     breast cancer  . Cancer Maternal Grandmother   . Heart disease Paternal Grandfather     History  Substance Use Topics  . Smoking status: Never Smoker   . Smokeless tobacco: Not on file  . Alcohol Use: No    Review of Systems   As noted in HPI  Filed Vitals:   03/26/15 1051  BP: 137/90  Pulse: 76  Temp: 98 F (36.7 C)  Resp: 16    Physical Exam  Physical Exam  Constitutional: No distress.  HENT:  Increased wax in right auditory canal, left canal no wax TM visualized not congested.   Dental cavities   Eyes: EOM are normal. Pupils are equal, round, and reactive to light.  Cardiovascular: Normal rate and regular rhythm.   Pulmonary/Chest: Breath sounds normal. No respiratory distress. She has no wheezes.  She has no rales.  Musculoskeletal: She exhibits no edema.    CBC    Component Value Date/Time   WBC 7.6 08/16/2014 1212   RBC 4.96 08/16/2014 1212   HGB 15.4* 08/16/2014 1212   HCT 45.3 08/16/2014 1212   PLT 465* 08/16/2014 1212   MCV 91.3 08/16/2014 1212   LYMPHSABS 2.5 08/16/2014 1212   MONOABS 0.6 08/16/2014 1212   EOSABS 0.1 08/16/2014 1212   BASOSABS 0.1 08/16/2014 1212    CMP     Component Value Date/Time   NA 139 12/19/2014 0947   K 5.0 12/19/2014 0947   CL 104 12/19/2014 0947   CO2 25 12/19/2014 0947   GLUCOSE 103* 12/19/2014 0947   BUN 11 12/19/2014 0947   CREATININE 0.87 12/19/2014 0947   CREATININE 0.87 08/03/2014 1200   CALCIUM 10.1 12/19/2014 0947   PROT 7.6 12/19/2014 0947   ALBUMIN 4.7 12/19/2014 0947   AST 17 12/19/2014 0947   ALT 12 12/19/2014 0947   ALKPHOS 88 12/19/2014 0947   BILITOT 0.5 12/19/2014 0947   GFRNONAA 73 12/19/2014 0947   GFRNONAA 72*  08/03/2014 1200   GFRAA 84 12/19/2014 0947   GFRAA 83* 08/03/2014 1200    Lab Results  Component Value Date/Time   CHOL 182 08/16/2014 12:12 PM    Lab Results  Component Value Date/Time   HGBA1C 6.0* 08/16/2014 12:12 PM    Lab Results  Component Value Date/Time   AST 17 12/19/2014 09:47 AM    Assessment and Plan  Essential hypertension - Plan: blood pressure is borderline elevated advised patient for DASH diet, continue with amLODipine (NORVASC) 5 MG tablet  Anxiety state - Plan: symptoms are stable continue with busPIRone (BUSPAR) 15 MG tablet  Excess ear wax, right Patient is prescribed Debrox ear drops, if not improved will try ear flush.  Dental cavities - Plan: Ambulatory referral to Dentistry  Prediabetes Advised patient for diabetes meal planning, previous hemoglobin A1c was 6.0%, patient will like to do blood work on the next visit.   Return in about 3 months (around 06/26/2015), or if symptoms worsen or fail to improve.   This note has been created with Surveyor, quantity. Any transcriptional errors are unintentional.    Lorayne Marek, MD

## 2015-07-06 ENCOUNTER — Encounter: Payer: Self-pay | Admitting: Family Medicine

## 2015-07-06 ENCOUNTER — Ambulatory Visit: Payer: Self-pay | Attending: Family Medicine | Admitting: Family Medicine

## 2015-07-06 VITALS — BP 111/73 | HR 92 | Temp 98.9°F | Resp 16 | Ht 64.0 in | Wt 149.0 lb

## 2015-07-06 DIAGNOSIS — F5104 Psychophysiologic insomnia: Secondary | ICD-10-CM | POA: Insufficient documentation

## 2015-07-06 DIAGNOSIS — G4700 Insomnia, unspecified: Secondary | ICD-10-CM | POA: Insufficient documentation

## 2015-07-06 DIAGNOSIS — I10 Essential (primary) hypertension: Secondary | ICD-10-CM | POA: Insufficient documentation

## 2015-07-06 DIAGNOSIS — Z0000 Encounter for general adult medical examination without abnormal findings: Secondary | ICD-10-CM | POA: Insufficient documentation

## 2015-07-06 DIAGNOSIS — F419 Anxiety disorder, unspecified: Secondary | ICD-10-CM | POA: Insufficient documentation

## 2015-07-06 DIAGNOSIS — B353 Tinea pedis: Secondary | ICD-10-CM | POA: Insufficient documentation

## 2015-07-06 MED ORDER — TRAZODONE HCL 50 MG PO TABS: 25.0000 mg | ORAL_TABLET | Freq: Every evening | ORAL | Status: DC | PRN

## 2015-07-06 MED ORDER — BUSPIRONE HCL 15 MG PO TABS: 15.0000 mg | ORAL_TABLET | Freq: Three times a day (TID) | ORAL | Status: DC

## 2015-07-06 MED ORDER — TERBINAFINE HCL 1 % EX CREA: 1.0000 "application " | TOPICAL_CREAM | Freq: Two times a day (BID) | CUTANEOUS | Status: DC

## 2015-07-06 MED ORDER — AMLODIPINE BESYLATE 5 MG PO TABS: 5.0000 mg | ORAL_TABLET | Freq: Every day | ORAL | Status: DC

## 2015-07-06 NOTE — Patient Instructions (Signed)
Kimberly [name] was seen today for establish care and hypertension.  Diagnoses and all orders for this visit:  Healthcare maintenance -     Ambulatory referral to Gastroenterology -     MM DIGITAL SCREENING BILATERAL; Future  Insomnia -     traZODone (DESYREL) 50 MG tablet; Take 0.5-1 tablets (25-50 mg total) by mouth at bedtime as needed for sleep.  Anxiety state -     busPIRone (BUSPAR) 15 MG tablet; Take 1 tablet (15 mg total) by mouth 3 (three) times daily.  Essential hypertension -     amLODipine (NORVASC) 5 MG tablet; Take 1 tablet (5 mg total) by mouth daily.   F/u in 4 weeks for insomnia Please call Jamie   Dr. Adrian Blackwater

## 2015-07-06 NOTE — Progress Notes (Signed)
Establish Care with PCP F/U HTN  Stated unable to sleep well throw the night

## 2015-07-06 NOTE — Progress Notes (Signed)
Patient ID: Kimberly [name], female   DOB: 1956-09-04, 59 y.o.   MRN: [id number]   Subjective:  Patient ID: Kimberly [name], female    DOB: 12/16/1955  Age: 60 y.o. MRN: [id number]  CC: Establish Care and Hypertension  HPI Kimberly [name] presents for   1. CHRONIC HYPERTENSION  Disease Monitoring  Blood pressure range: not checking   Chest pain: no   Dyspnea: no   Claudication: no   Medication compliance: yes  Medication Side Effects  Lightheadedness: no   Urinary frequency: no   Edema: no    2. Insomnia: for nearly one year. Wake up to urinate. Goes to sleep at 10 PM after taking buspar with a few sips of water.  Wakes up to 1- 2 AM. Not urinating a lot. Lives with daughter, your son-in-law and 5 grand children.   3. Foot itching: chronic. Between toes with skin peeling.   4. Anxiety: for decades. Taking buspar BID. Prescribed as TID.   Social History  Substance Use Topics  . Smoking status: Never Smoker   . Smokeless tobacco: Not on file  . Alcohol Use: No    Outpatient Prescriptions Prior to Visit  Medication Sig Dispense Refill  . amLODipine (NORVASC) 5 MG tablet Take 1 tablet (5 mg total) by mouth daily. 90 tablet 3  . busPIRone (BUSPAR) 15 MG tablet Take 1 tablet (15 mg total) by mouth 3 (three) times daily. 90 tablet 3  . Multiple Vitamin (MULTIVITAMIN WITH MINERALS) TABS tablet Take 1 tablet by mouth daily.    . carbamide peroxide (DEBROX) 6.5 % otic solution Place 5 drops into the right ear 2 (two) times daily. (Patient not taking: Reported on 07/06/2015) 15 mL 1  . ibuprofen (ADVIL,MOTRIN) 600 MG tablet Take 1 tablet (600 mg total) by mouth every 8 (eight) hours as needed. (Patient not taking: Reported on 07/06/2015) 30 tablet 1   No facility-administered medications prior to visit.    ROS Review of Systems  Constitutional: Negative for fever and chills.  Eyes: Negative for visual disturbance.  Respiratory: Negative for shortness of breath.   Cardiovascular:  Negative for chest pain.  Gastrointestinal: Negative for abdominal pain and blood in stool.  Musculoskeletal: Negative for back pain and arthralgias.  Skin: Positive for rash.       Between toes, peeling   Allergic/Immunologic: Negative for immunocompromised state.  Hematological: Negative for adenopathy. Does not bruise/bleed easily.  Psychiatric/Behavioral: Positive for sleep disturbance. Negative for suicidal ideas and dysphoric mood. The patient is nervous/anxious.   GAD-7: score of 7. 1-2,3,4. 2-1,6.   Objective:  BP 111/73 mmHg  Pulse 92  Temp(Src) 98.9 F (37.2 C) (Oral)  Resp 16  Ht 5\' 4"  (1.626 m)  Wt 149 lb (67.586 kg)  BMI 25.56 kg/m2  SpO2 98%  BP/Weight 07/06/2015 01/09/761 11/18/3333  Systolic BP 456 256 389  Diastolic BP 73 90 90  Wt. (Lbs) 149 156 166  BMI 25.56 25.96 27.62    Physical Exam  Constitutional: She is oriented to person, place, and time. She appears well-developed and well-nourished. No distress.  HENT:  Head: Normocephalic and atraumatic.  Cardiovascular: Normal rate, regular rhythm, normal heart sounds and intact distal pulses.   Pulmonary/Chest: Effort normal and breath sounds normal.  Musculoskeletal: She exhibits no edema.  Neurological: She is alert and oriented to person, place, and time.  Skin: Skin is warm and dry. Rash noted.  Peeling between toes   Psychiatric: She has a normal mood and affect.  Assessment & Plan:   Problem List Items Addressed This Visit    Chronic anxiety (Chronic)   Relevant Medications   traZODone (DESYREL) 50 MG tablet   busPIRone (BUSPAR) 15 MG tablet   HTN (hypertension) (Chronic)   Relevant Medications   amLODipine (NORVASC) 5 MG tablet   Insomnia   Relevant Medications   traZODone (DESYREL) 50 MG tablet   Tinea pedis of both feet   Relevant Medications   terbinafine (LAMISIL AT) 1 % cream    Other Visit Diagnoses    Healthcare maintenance    -  Primary    Relevant Orders    Ambulatory  referral to Gastroenterology    MM DIGITAL SCREENING BILATERAL       No orders of the defined types were placed in this encounter.    Follow-up: Return in about 4 weeks (around 08/03/2015) for insomina .   Boykin Nearing MD

## 2015-08-20 ENCOUNTER — Encounter: Payer: Self-pay | Admitting: Family Medicine

## 2015-08-20 ENCOUNTER — Ambulatory Visit: Payer: Self-pay | Attending: Family Medicine | Admitting: Family Medicine

## 2015-08-20 ENCOUNTER — Encounter (HOSPITAL_BASED_OUTPATIENT_CLINIC_OR_DEPARTMENT_OTHER): Payer: Self-pay | Admitting: Clinical

## 2015-08-20 VITALS — BP 121/77 | HR 91 | Temp 99.1°F | Resp 16 | Ht 64.0 in | Wt 149.0 lb

## 2015-08-20 DIAGNOSIS — F419 Anxiety disorder, unspecified: Secondary | ICD-10-CM

## 2015-08-20 DIAGNOSIS — G4700 Insomnia, unspecified: Secondary | ICD-10-CM | POA: Insufficient documentation

## 2015-08-20 MED ORDER — TRAZODONE HCL 50 MG PO TABS: 50.0000 mg | ORAL_TABLET | Freq: Every evening | ORAL | Status: DC | PRN

## 2015-08-20 MED ORDER — FLUOXETINE HCL 20 MG PO TABS: 10.0000 mg | ORAL_TABLET | Freq: Every day | ORAL | Status: DC

## 2015-08-20 NOTE — Progress Notes (Signed)
Patient ID: Kimberly [name], female   DOB: 17-Dec-1955, 59 y.o.   MRN: [id number]   Subjective:  Patient ID: Kimberly [name], female    DOB: 06-Oct-1956  Age: 59 y.o. MRN: [id number]  CC: Insomnia   HPI Kimberly [name] presents for    1. Insomnia: patient having trouble sleeping. 25 mg of trazodone has not helped much. She has chronic anxiety. She has some depression. No SI.   2. Anxiety: chronic. No buspar. Previously also on SSRI. Cannot recall response to treatment. buspar is not currently controlling symptoms adequately. She is anxious at home, she helps care for her 5 grandchildren after school. She has little adult interaction. She enjoys volunteering at [employer]. He main transportation is the bus.   3. HM: had colonscopy 30 years ago while in [place], [place]. Reportedly related to bowel obstruction.   Social History  Substance Use Topics  . Smoking status: Never Smoker   . Smokeless tobacco: Not on file  . Alcohol Use: No    Outpatient Prescriptions Prior to Visit  Medication Sig Dispense Refill  . amLODipine (NORVASC) 5 MG tablet Take 1 tablet (5 mg total) by mouth daily. 90 tablet 3  . busPIRone (BUSPAR) 15 MG tablet Take 1 tablet (15 mg total) by mouth 3 (three) times daily. 90 tablet 3  . Melatonin 1 MG CAPS Take by mouth.    . Multiple Vitamin (MULTIVITAMIN WITH MINERALS) TABS tablet Take 1 tablet by mouth daily.    Marland Kitchen terbinafine (LAMISIL AT) 1 % cream Apply 1 application topically 2 (two) times daily. 30 g 0  . traZODone (DESYREL) 50 MG tablet Take 0.5-1 tablets (25-50 mg total) by mouth at bedtime as needed for sleep. 30 tablet 3   No facility-administered medications prior to visit.    ROS Review of Systems  Constitutional: Negative for fever and chills.  Eyes: Negative for visual disturbance.  Respiratory: Negative for shortness of breath.   Cardiovascular: Negative for chest pain.  Gastrointestinal: Negative for abdominal pain and blood in stool.  Musculoskeletal:  Negative for back pain and arthralgias.  Skin: Negative for rash.  Allergic/Immunologic: Negative for immunocompromised state.  Hematological: Negative for adenopathy. Does not bruise/bleed easily.  Psychiatric/Behavioral: Positive for sleep disturbance and dysphoric mood. Negative for suicidal ideas and self-injury. The patient is nervous/anxious.     Objective:  BP 121/77 mmHg  Pulse 91  Temp(Src) 99.1 F (37.3 C) (Oral)  Resp 16  Ht 5\' 4"  (1.626 m)  Wt 149 lb (67.586 kg)  BMI 25.56 kg/m2  SpO2 96%  BP/Weight 08/20/2015 07/06/2015 2/42/6834  Systolic BP 196 222 979  Diastolic BP 77 73 90  Wt. (Lbs) 149 149 156  BMI 25.56 25.56 25.96    Physical Exam  Constitutional: She is oriented to person, place, and time. She appears well-developed and well-nourished. No distress.  HENT:  Head: Normocephalic and atraumatic.  Neck: No thyromegaly present.  Cardiovascular: Normal rate, regular rhythm, normal heart sounds and intact distal pulses.   Pulmonary/Chest: Effort normal and breath sounds normal.  Musculoskeletal: She exhibits no edema.  Neurological: She is alert and oriented to person, place, and time.  Skin: Skin is warm and dry. No rash noted.  Psychiatric: Her mood appears anxious. She does not exhibit a depressed mood.  Jittery during exam    Depression screen [facility] 2/9 08/20/2015 07/06/2015 08/25/2014  Decreased Interest 0 0 0  Down, Depressed, Hopeless 1 1 0  PHQ - 2 Score 1 1 0  Altered sleeping 3 - -  Tired, decreased energy 1 - -  Change in appetite 1 - -  Feeling bad or failure about yourself  2 - -  Trouble concentrating 0 - -  Moving slowly or fidgety/restless 0 - -  Suicidal thoughts 0 - -  PHQ-9 Score 8 - -   GAD 7 : Generalized Anxiety Score 08/20/2015  Nervous, Anxious, on Edge 2  Control/stop worrying 2  Worry too much - different things 2  Trouble relaxing 1  Restless 0  Easily annoyed or irritable 2  Afraid - awful might happen 1  Total GAD 7 Score  10     Assessment & Plan:   Patient referred to [place] for senior resources   Problem List Items Addressed This Visit    Chronic anxiety (Chronic)   Relevant Medications   traZODone (DESYREL) 50 MG tablet   FLUoxetine (PROZAC) 20 MG tablet   Insomnia - Primary   Relevant Medications   traZODone (DESYREL) 50 MG tablet      No orders of the defined types were placed in this encounter.    Follow-up: No Follow-up on file.   Boykin Nearing MD

## 2015-08-20 NOTE — Progress Notes (Signed)
ASSESSMENT: Pt currently experiencing symptoms of anxiety. Pt needs to f/u with PCP and [facility], and would benefit from community resources, psychoeducation and supportive counseling regarding coping with sympoms of anxiety.  Stage of Change: precontemplative  PLAN: 1. F/U with behavioral health consultant in at next PCP visit 2. Psychiatric Medications: Prozac (will increase today), Buspar, Desyrel 3. Behavioral recommendation(s):   -Consider reading educational material regarding coping with symptoms of anxiety -Consider utilizing services at [employer] as social support (Grandparents raising [place] group, etc) by calling to request transportation to first event SUBJECTIVE: Pt. referred by Dr Adrian Blackwater for symptoms of anxiety:  Pt. reports the following symptoms/concerns: Pt states that she has had anxiety since she was a little girl, that she was not sleeping well, that she sleeps until 9am, "make my rounds", and then the grandchildren start getting off the school buses; she cares for her 5 grandchildren, from 80-50 years old, while their mom is at work, and she lives with them all. Visibly shaky and slightly pressured speech, says she attends weekly church group, does not have transportation Duration of problem: moderate Severity: moderate  OBJECTIVE: Orientation & Cognition: Oriented x3. Thought processes normal and appropriate to situation. Mood: appropriate. Affect: appropriate Appearance: appropriate Risk of harm to self or others: no known risk of harm to self or others Substance use: none Assessments administered: PHQ9: 8/ GAD7: 10  Diagnosis: Chronic anxiety CPT Code: F41.9 -------------------------------------------- Other(s) present in the room: none  Time spent with patient in exam room: 16 minutes

## 2015-08-20 NOTE — Progress Notes (Signed)
F/U insomnia  Stated medication not helping  Unable to sleep  No pain today  No tobacco user

## 2015-08-20 NOTE — Patient Instructions (Signed)
Kimberly [name] was seen today for insomnia.  Diagnoses and all orders for this visit:  Insomnia -     traZODone (DESYREL) 50 MG tablet; Take 1 tablet (50 mg total) by mouth at bedtime as needed for sleep.  Chronic anxiety -     FLUoxetine (PROZAC) 20 MG tablet; Take 0.5 tablets (10 mg total) by mouth daily. 10 mg for one week, then 20 mg daily   Will send for records of last colonoscopy   Increase trazodone to 50 mg nightly Start prozac 10  (1/2 tablet) daily for 7 days then 20 mg daily Continue buspar  Counseling services available at [facility] of [place], [place] and [place].   F/u in 4 weeks for anxiety Dr. Adrian Blackwater

## 2015-08-22 ENCOUNTER — Telehealth: Payer: Self-pay | Admitting: Family Medicine

## 2015-08-22 NOTE — Telephone Encounter (Signed)
Pt. Called stating that her PCP was going to ask another facility for her medical records and pt. Wanted to let PCP know that in the other facility she is listed as Kimberly [name]. Please f/u

## 2015-08-23 NOTE — Telephone Encounter (Signed)
Patient advised to contact previous hospital directly as I will be unable to obtain records if they are under a different name.

## 2015-09-17 ENCOUNTER — Ambulatory Visit: Payer: Self-pay | Admitting: Family Medicine

## 2015-09-28 ENCOUNTER — Encounter: Payer: Self-pay | Admitting: Family Medicine

## 2015-09-28 ENCOUNTER — Ambulatory Visit: Payer: Self-pay | Attending: Family Medicine | Admitting: Family Medicine

## 2015-09-28 VITALS — BP 122/77 | HR 82 | Temp 98.5°F | Resp 16 | Ht 65.0 in | Wt 151.0 lb

## 2015-09-28 DIAGNOSIS — M25511 Pain in right shoulder: Secondary | ICD-10-CM

## 2015-09-28 DIAGNOSIS — F419 Anxiety disorder, unspecified: Secondary | ICD-10-CM

## 2015-09-28 DIAGNOSIS — Z79899 Other long term (current) drug therapy: Secondary | ICD-10-CM | POA: Insufficient documentation

## 2015-09-28 MED ORDER — IBUPROFEN 600 MG PO TABS: 600.0000 mg | ORAL_TABLET | Freq: Three times a day (TID) | ORAL | Status: DC | PRN

## 2015-09-28 MED ORDER — DICLOFENAC SODIUM 1 % TD GEL: 4.0000 g | Freq: Four times a day (QID) | TRANSDERMAL | Status: DC

## 2015-09-28 MED ORDER — FLUOXETINE HCL 20 MG PO TABS: 20.0000 mg | ORAL_TABLET | Freq: Every day | ORAL | Status: DC

## 2015-09-28 NOTE — Assessment & Plan Note (Signed)
A: stable P: Continue current regimen

## 2015-09-28 NOTE — Patient Instructions (Signed)
Raenah was seen today for anxiety.  Diagnoses and all orders for this visit:  Right shoulder pain -     ibuprofen (ADVIL,MOTRIN) 600 MG tablet; Take 1 tablet (600 mg total) by mouth every 8 (eight) hours as needed. -     diclofenac sodium (VOLTAREN) 1 % GEL; Apply 4 g topically 4 (four) times daily.  Chronic anxiety -     FLUoxetine (PROZAC) 20 MG tablet; Take 1 tablet (20 mg total) by mouth daily.   We will obtain your last colonoscopy results  F/u in 3 months for anxiety  Dr. Adrian Blackwater

## 2015-09-28 NOTE — Progress Notes (Signed)
F/U Anxiety Stated feeling sand and happy at the same time Possible due to the holidays. Soreness on rt arm and wrist  No pain now  No tobacco user  No suicidal thought in the past two weeks

## 2015-09-28 NOTE — Progress Notes (Signed)
Patient ID: Kimberly [name], female   DOB: 06-Feb-1956, 59 y.o.   MRN: [id number]   Subjective:  Patient ID: Kimberly [name], female    DOB: September 29, 1956  Age: 59 y.o. MRN: [id number]  CC: Anxiety   HPI Rayvon Espeland presents for   1. Anxiety: stable. Taking medications. No worsening. Feels happy and sad at times. No SI.   2. R shoulder pain: comes and goes. Worse with movement. Does not radiate. No recent injury.    Social History  Substance Use Topics  . Smoking status: Never Smoker   . Smokeless tobacco: Not on file  . Alcohol Use: No   Outpatient Prescriptions Prior to Visit  Medication Sig Dispense Refill  . amLODipine (NORVASC) 5 MG tablet Take 1 tablet (5 mg total) by mouth daily. 90 tablet 3  . busPIRone (BUSPAR) 15 MG tablet Take 1 tablet (15 mg total) by mouth 3 (three) times daily. 90 tablet 3  . FLUoxetine (PROZAC) 20 MG tablet Take 0.5 tablets (10 mg total) by mouth daily. 10 mg for one week, then 20 mg daily 30 tablet 3  . traZODone (DESYREL) 50 MG tablet Take 1 tablet (50 mg total) by mouth at bedtime as needed for sleep. 30 tablet 3  . Melatonin 1 MG CAPS Take by mouth. Reported on 09/28/2015    . Multiple Vitamin (MULTIVITAMIN WITH MINERALS) TABS tablet Take 1 tablet by mouth daily. Reported on 09/28/2015    . terbinafine (LAMISIL AT) 1 % cream Apply 1 application topically 2 (two) times daily. (Patient not taking: Reported on 09/28/2015) 30 g 0   No facility-administered medications prior to visit.    ROS Review of Systems  Constitutional: Negative for fever and chills.  Eyes: Negative for visual disturbance.  Respiratory: Negative for shortness of breath.   Cardiovascular: Negative for chest pain.  Gastrointestinal: Negative for abdominal pain and blood in stool.  Musculoskeletal: Positive for arthralgias (R shoulder ). Negative for back pain.  Skin: Negative for rash.  Allergic/Immunologic: Negative for immunocompromised state.  Hematological: Negative for  adenopathy. Does not bruise/bleed easily.  Psychiatric/Behavioral: Positive for sleep disturbance and dysphoric mood. Negative for suicidal ideas and self-injury. The patient is nervous/anxious.     Objective:  BP 122/77 mmHg  Pulse 82  Temp(Src) 98.5 F (36.9 C) (Oral)  Resp 16  Ht 5\' 5"  (1.651 m)  Wt 151 lb (68.493 kg)  BMI 25.13 kg/m2  SpO2 99%  BP/Weight 09/28/2015 08/20/2015 [id number]  Systolic BP [id number]  Diastolic BP 77 77 73  Wt. (Lbs) 151 149 149  BMI 25.13 25.56 25.56   Physical Exam  Constitutional: She is oriented to person, place, and time. She appears well-developed and well-nourished. No distress.  HENT:  Head: Normocephalic and atraumatic.  Neck: No thyromegaly present.  Cardiovascular: Normal rate, regular rhythm, normal heart sounds and intact distal pulses.   Pulmonary/Chest: Effort normal and breath sounds normal.  Musculoskeletal: She exhibits no edema.       Right shoulder: She exhibits normal range of motion, no tenderness, no bony tenderness, no swelling, no effusion, no crepitus, no deformity, no laceration, no pain, no spasm, normal pulse and normal strength.  Neurological: She is alert and oriented to person, place, and time.  Skin: Skin is warm and dry. No rash noted.  Psychiatric: She has a normal mood and affect. She does not exhibit a depressed mood.  Jittery during exam      Depression screen [facility] 2/9 09/28/2015 08/20/2015 07/06/2015 08/25/2014 08/16/2014  Decreased Interest 2 0 0 0 0  Down, Depressed, Hopeless 2 1 1  0 0  PHQ - 2 Score 4 1 1  0 0  Altered sleeping 3 3 - - -  Tired, decreased energy 0 1 - - -  Change in appetite 0 1 - - -  Feeling bad or failure about yourself  1 2 - - -  Trouble concentrating 0 0 - - -  Moving slowly or fidgety/restless 0 0 - - -  Suicidal thoughts 0 0 - - -  PHQ-9 Score 8 8 - - -    GAD 7 : Generalized Anxiety Score 09/28/2015 08/20/2015  Nervous, Anxious, on Edge 3 2  Control/stop worrying 2 2    Worry too much - different things 3 2  Trouble relaxing 2 1  Restless 1 0  Easily annoyed or irritable 2 2  Afraid - awful might happen 1 1  Total GAD 7 Score 14 10    Assessment & Plan:   Problem List Items Addressed This Visit    Chronic anxiety (Chronic)    A: stable P: Continue current regimen       Relevant Medications   FLUoxetine (PROZAC) 20 MG tablet   Right shoulder pain - Primary    A: R shoulder pain suspect arthritis P: voltaren gel Ibuprofen sparingly       Relevant Medications   ibuprofen (ADVIL,MOTRIN) 600 MG tablet   diclofenac sodium (VOLTAREN) 1 % GEL      No orders of the defined types were placed in this encounter.    Follow-up: No Follow-up on file.   Boykin Nearing MD

## 2015-09-28 NOTE — Assessment & Plan Note (Signed)
A: R shoulder pain suspect arthritis P: voltaren gel Ibuprofen sparingly

## 2015-10-23 MED FILL — ?AMLODIPINE BESYLATE 5 MG T: 5 | 30 days supply | Qty: 30 | Fill #3

## 2015-11-20 MED FILL — ?AMLODIPINE BESYLATE 5 MG T: 5 | 30 days supply | Qty: 30 | Fill #4

## 2015-11-20 MED FILL — ?BUSPIRONE HCL 15 MG TABLET: 15 | 30 days supply | Qty: 90 | Fill #3

## 2015-11-29 MED FILL — ?FLUOXETINE HCL 20MG TABLET: 20 | 30 days supply | Qty: 30 | Fill #1

## 2015-11-29 MED FILL — traZODone HCL 50 MG TABS: 50 | 30 days supply | Qty: 30 | Fill #2

## 2015-12-11 ENCOUNTER — Telehealth (HOSPITAL_COMMUNITY): Payer: Self-pay | Admitting: *Deleted

## 2015-12-11 NOTE — Telephone Encounter (Signed)
Telephoned patient at home # and left message to return call to [facility] 

## 2015-12-20 MED FILL — AMLODIPINE BESYLATE 5 MG TA: 5 | 30 days supply | Qty: 30 | Fill #5

## 2015-12-27 ENCOUNTER — Ambulatory Visit: Payer: Self-pay | Admitting: Family Medicine

## 2016-01-07 ENCOUNTER — Ambulatory Visit: Payer: Self-pay | Attending: Family Medicine | Admitting: Family Medicine

## 2016-01-07 ENCOUNTER — Encounter: Payer: Self-pay | Admitting: Family Medicine

## 2016-01-07 ENCOUNTER — Other Ambulatory Visit: Payer: Self-pay | Admitting: Family Medicine

## 2016-01-07 VITALS — BP 120/69 | HR 95 | Temp 98.3°F | Resp 18 | Ht 64.0 in | Wt 157.4 lb

## 2016-01-07 DIAGNOSIS — Z1159 Encounter for screening for other viral diseases: Secondary | ICD-10-CM

## 2016-01-07 DIAGNOSIS — R05 Cough: Secondary | ICD-10-CM | POA: Insufficient documentation

## 2016-01-07 DIAGNOSIS — Z114 Encounter for screening for human immunodeficiency virus [HIV]: Secondary | ICD-10-CM | POA: Insufficient documentation

## 2016-01-07 DIAGNOSIS — Z0000 Encounter for general adult medical examination without abnormal findings: Secondary | ICD-10-CM

## 2016-01-07 DIAGNOSIS — R110 Nausea: Secondary | ICD-10-CM | POA: Insufficient documentation

## 2016-01-07 DIAGNOSIS — Z79899 Other long term (current) drug therapy: Secondary | ICD-10-CM | POA: Insufficient documentation

## 2016-01-07 DIAGNOSIS — F419 Anxiety disorder, unspecified: Secondary | ICD-10-CM | POA: Insufficient documentation

## 2016-01-07 DIAGNOSIS — J302 Other seasonal allergic rhinitis: Secondary | ICD-10-CM | POA: Insufficient documentation

## 2016-01-07 DIAGNOSIS — Z1231 Encounter for screening mammogram for malignant neoplasm of breast: Secondary | ICD-10-CM

## 2016-01-07 DIAGNOSIS — R0982 Postnasal drip: Secondary | ICD-10-CM | POA: Insufficient documentation

## 2016-01-07 DIAGNOSIS — G4700 Insomnia, unspecified: Secondary | ICD-10-CM | POA: Insufficient documentation

## 2016-01-07 DIAGNOSIS — R0981 Nasal congestion: Secondary | ICD-10-CM | POA: Insufficient documentation

## 2016-01-07 LAB — HEPATITIS C ANTIBODY: HCV AB: NEGATIVE

## 2016-01-07 MED ORDER — BUSPIRONE HCL 15 MG PO TABS
15.0000 mg | ORAL_TABLET | Freq: Three times a day (TID) | ORAL | Status: DC
Start: 2016-01-07 — End: 2016-07-07

## 2016-01-07 MED ORDER — TRAZODONE HCL 50 MG PO TABS: 50.0000 mg | ORAL_TABLET | Freq: Every evening | ORAL | Status: DC | PRN

## 2016-01-07 MED ORDER — FLUOXETINE HCL 20 MG PO TABS: 20.0000 mg | ORAL_TABLET | Freq: Every day | ORAL | Status: DC

## 2016-01-07 MED ORDER — CETIRIZINE HCL 10 MG PO TABS: 10.0000 mg | ORAL_TABLET | Freq: Every day | ORAL | Status: DC

## 2016-01-07 MED FILL — traZODone HCL 50 MG TABS: 50 | 30 days supply | Qty: 30 | Fill #0

## 2016-01-07 MED FILL — ?CETIRIZINE HCL 10 MG TABLE: 10 | 30 days supply | Qty: 30 | Fill #0

## 2016-01-07 MED FILL — ?BUSPIRONE HCL 15 MG TABLET: 15 | 30 days supply | Qty: 90 | Fill #0

## 2016-01-07 MED FILL — FLUoxetine HCL 20 MG CAPS: 20 | 30 days supply | Qty: 30 | Fill #0

## 2016-01-07 NOTE — Progress Notes (Signed)
Patient ID: Kimberly [name], female   DOB: [birth year], 60 y.o.   MRN: [id number]   Subjective:  Patient ID: Kimberly [name], female    DOB: [birth year]  Age: 60 y.o. MRN: [id number]  CC: No chief complaint on file.   HPI Krupa Diantonio presents for   1. Anxiety: stable. Taking medications. No worsening. Feels happy and sad at times. No SI.   2. Allergies: having cough, nasal congestion and post nasal drip with nausea. No fever or chills. Started about 2-3 weeks ago.    Social History  Substance Use Topics  . Smoking status: Never Smoker   . Smokeless tobacco: Not on file  . Alcohol Use: No   Outpatient Prescriptions Prior to Visit  Medication Sig Dispense Refill  . amLODipine (NORVASC) 5 MG tablet Take 1 tablet (5 mg total) by mouth daily. 90 tablet 3  . busPIRone (BUSPAR) 15 MG tablet Take 1 tablet (15 mg total) by mouth 3 (three) times daily. 90 tablet 3  . diclofenac sodium (VOLTAREN) 1 % GEL Apply 4 g topically 4 (four) times daily. 100 g 2  . FLUoxetine (PROZAC) 20 MG tablet Take 1 tablet (20 mg total) by mouth daily. 30 tablet 3  . ibuprofen (ADVIL,MOTRIN) 600 MG tablet Take 1 tablet (600 mg total) by mouth every 8 (eight) hours as needed. 30 tablet 0  . Melatonin 1 MG CAPS Take by mouth. Reported on 09/28/2015    . Multiple Vitamin (MULTIVITAMIN WITH MINERALS) TABS tablet Take 1 tablet by mouth daily. Reported on 09/28/2015    . traZODone (DESYREL) 50 MG tablet Take 1 tablet (50 mg total) by mouth at bedtime as needed for sleep. 30 tablet 3   No facility-administered medications prior to visit.    ROS Review of Systems  Constitutional: Negative for fever and chills.  HENT: Positive for congestion and postnasal drip.   Eyes: Negative for visual disturbance.  Respiratory: Negative for shortness of breath.   Cardiovascular: Negative for chest pain.  Gastrointestinal: Positive for nausea. Negative for abdominal pain and blood in stool.  Musculoskeletal: Positive for  arthralgias (R shoulder ). Negative for back pain.  Skin: Negative for rash.  Allergic/Immunologic: Negative for immunocompromised state.  Hematological: Negative for adenopathy. Does not bruise/bleed easily.  Psychiatric/Behavioral: Positive for sleep disturbance and dysphoric mood. Negative for suicidal ideas and self-injury. The patient is nervous/anxious.     Objective:  BP 120/69 mmHg  Pulse 95  Temp(Src) 98.3 F (36.8 C) (Oral)  Resp 18  Ht 5\' 4"  (1.626 m)  Wt 157 lb 6.4 oz (71.396 kg)  BMI 27.00 kg/m2  SpO2 99%  BP/Weight 01/07/2016 09/28/2015 99991111  Systolic BP 123456 123XX123 123XX123  Diastolic BP 69 77 77  Wt. (Lbs) 157.4 151 149  BMI 27 25.13 25.56   Physical Exam  Constitutional: She is oriented to person, place, and time. She appears well-developed and well-nourished. No distress.  HENT:  Head: Normocephalic and atraumatic.  Right Ear: External ear normal.  Left Ear: Tympanic membrane, external ear and ear canal normal.  Nose: Mucosal edema present. Right sinus exhibits no maxillary sinus tenderness and no frontal sinus tenderness. Left sinus exhibits no maxillary sinus tenderness and no frontal sinus tenderness.  Wax in canal in R ear   Neck: No thyromegaly present.  Cardiovascular: Normal rate, regular rhythm, normal heart sounds and intact distal pulses.   Pulmonary/Chest: Effort normal and breath sounds normal.  Musculoskeletal: She exhibits no edema.       Right shoulder:  She exhibits normal range of motion, no tenderness, no bony tenderness, no swelling, no effusion, no crepitus, no deformity, no laceration, no pain, no spasm, normal pulse and normal strength.  Neurological: She is alert and oriented to person, place, and time.  Skin: Skin is warm and dry. No rash noted.  Psychiatric: She has a normal mood and affect. She does not exhibit a depressed mood.     Depression screen [facility] 2/9 01/07/2016 09/28/2015 08/20/2015 07/06/2015 08/25/2014  Decreased Interest 0 2 0  0 0  Down, Depressed, Hopeless 2 2 1 1  0  PHQ - 2 Score 2 4 1 1  0  Altered sleeping 2 3 3  - -  Tired, decreased energy 2 0 1 - -  Change in appetite 0 0 1 - -  Feeling bad or failure about yourself  2 1 2  - -  Trouble concentrating 0 0 0 - -  Moving slowly or fidgety/restless 0 0 0 - -  Suicidal thoughts 0 0 0 - -  PHQ-9 Score 8 8 8  - -    GAD 7 : Generalized Anxiety Score 01/07/2016 09/28/2015 08/20/2015  Nervous, Anxious, on Edge 3 3 2   Control/stop worrying 2 2 2   Worry too much - different things 2 3 2   Trouble relaxing 1 2 1   Restless 0 1 0  Easily annoyed or irritable 2 2 2   Afraid - awful might happen 1 1 1   Total GAD 7 Score 11 14 10     Assessment & Plan:   Problem List Items Addressed This Visit    Insomnia   Relevant Medications   traZODone (DESYREL) 50 MG tablet    Other Visit Diagnoses    Seasonal allergies    -  Primary    Relevant Medications    cetirizine (ZYRTEC) 10 MG tablet    Screening for HIV (human immunodeficiency virus)        Relevant Orders    HIV antibody (with reflex)    Need for hepatitis C screening test        Relevant Orders    Hepatitis C antibody, reflex    Visit for screening mammogram        Relevant Orders    MM [facility] maintenance        Relevant Orders    Ambulatory referral to Gastroenterology       No orders of the defined types were placed in this encounter.    Follow-up: No Follow-up on file.   Boykin Nearing MD

## 2016-01-07 NOTE — Patient Instructions (Addendum)
Kimberly [name] was seen today for anxiety.  Diagnoses and all orders for this visit:  Seasonal allergies -     cetirizine (ZYRTEC) 10 MG tablet; Take 1 tablet (10 mg total) by mouth daily.  Insomnia -     traZODone (DESYREL) 50 MG tablet; Take 1 tablet (50 mg total) by mouth at bedtime as needed for sleep.  Screening for HIV (human immunodeficiency virus) -     HIV antibody (with reflex)  Need for hepatitis C screening test -     Hepatitis C antibody, reflex  Visit for screening mammogram -     MM DIGITAL SCREENING BILATERAL; Future  Healthcare maintenance -     Ambulatory referral to Gastroenterology   Ordered screening HIV, Hep C, mammogram and colonscopy  F/u in 6 months for anxiety   Dr. Adrian Blackwater

## 2016-01-07 NOTE — Progress Notes (Signed)
Patient is here to FU for Anxiety  Patient denies pain at this time.  Patient complains of allergy symptoms.

## 2016-01-07 NOTE — Assessment & Plan Note (Signed)
Stable chronic anxiety Refilled meds

## 2016-01-07 NOTE — Assessment & Plan Note (Signed)
Seasonal allergies Add zyrtec Recommended OTC nasal saline

## 2016-01-08 ENCOUNTER — Encounter: Payer: Self-pay | Admitting: Clinical

## 2016-01-08 LAB — HIV ANTIBODY (ROUTINE TESTING W REFLEX): HIV: NONREACTIVE

## 2016-01-08 NOTE — Progress Notes (Signed)
Depression screen [facility] 2/9 01/07/2016 09/28/2015 08/20/2015 07/06/2015 08/25/2014  Decreased Interest 0 2 0 0 0  Down, Depressed, Hopeless 2 2 1 1  0  PHQ - 2 Score 2 4 1 1  0  Altered sleeping 2 3 3  - -  Tired, decreased energy 2 0 1 - -  Change in appetite 0 0 1 - -  Feeling bad or failure about yourself  2 1 2  - -  Trouble concentrating 0 0 0 - -  Moving slowly or fidgety/restless 0 0 0 - -  Suicidal thoughts 0 0 0 - -  PHQ-9 Score 8 8 8  - -    GAD 7 : Generalized Anxiety Score 01/07/2016 09/28/2015 08/20/2015  Nervous, Anxious, on Edge 3 3 2   Control/stop worrying 2 2 2   Worry too much - different things 2 3 2   Trouble relaxing 1 2 1   Restless 0 1 0  Easily annoyed or irritable 2 2 2   Afraid - awful might happen 1 1 1   Total GAD 7 Score 11 14 10

## 2016-01-10 ENCOUNTER — Telehealth: Payer: Self-pay | Admitting: *Deleted

## 2016-01-10 NOTE — Telephone Encounter (Signed)
-----   Message from Boykin Nearing, MD sent at 01/08/2016  8:46 AM EDT ----- Screening HIV and Hep C negative

## 2016-01-10 NOTE — Telephone Encounter (Signed)
Date of birth verified by pt  Lab results given Pt verbalized understanding 

## 2016-01-17 MED FILL — AMLODIPINE BESYLATE 5 MG TA: 5 | 30 days supply | Qty: 30 | Fill #6

## 2016-02-04 MED FILL — traZODone HCL 50 MG TABS: 50 | 30 days supply | Qty: 30 | Fill #1

## 2016-02-18 ENCOUNTER — Other Ambulatory Visit: Payer: Self-pay | Admitting: Family Medicine

## 2016-02-18 MED FILL — IBUPROFEN 600 MG TABLET: 600 | 10 days supply | Qty: 30 | Fill #0

## 2016-02-18 MED FILL — ?AMLODIPINE BESYLATE 5 MG T: 5 | 30 days supply | Qty: 30 | Fill #7

## 2016-02-21 ENCOUNTER — Ambulatory Visit (HOSPITAL_COMMUNITY)
Admission: RE | Admit: 2016-02-21 | Discharge: 2016-02-21 | Disposition: A | Discharge status: Home / Self Care | Payer: Self-pay | Source: Ambulatory Visit | Attending: Obstetrics and Gynecology | Admitting: Obstetrics and Gynecology

## 2016-02-21 ENCOUNTER — Encounter (HOSPITAL_COMMUNITY): Payer: Self-pay

## 2016-02-21 ENCOUNTER — Ambulatory Visit
Admission: RE | Admit: 2016-02-21 | Discharge: 2016-02-21 | Disposition: A | Discharge status: Home / Self Care | Payer: No Typology Code available for payment source | Source: Ambulatory Visit | Attending: Family Medicine | Admitting: Family Medicine

## 2016-02-21 VITALS — BP 124/84 | Temp 98.7°F | Ht 65.0 in | Wt 162.8 lb

## 2016-02-21 DIAGNOSIS — Z1231 Encounter for screening mammogram for malignant neoplasm of breast: Secondary | ICD-10-CM

## 2016-02-21 DIAGNOSIS — Z1239 Encounter for other screening for malignant neoplasm of breast: Secondary | ICD-10-CM

## 2016-02-21 NOTE — Patient Instructions (Signed)
Educational materials on self breast awareness given. Explained to Kimberly [name] that she did not need a Pap smear today due to last Pap smear was 08/25/2014. Let her know [facility] will cover Pap smears and HPV typing every 5 years unless has a history of abnormal Pap smears. Referred patient to the [facility] for diagnostic mammogram. Appointment scheduled for Thursday, Feb 21, 2016 at 1150. Let patient know the [facility] will follow up with he within the next couple of weeks with results of mammogram by letter or phone. Kimberly [name] verbalized understanding.  Elyce Zollinger, Arvil Chaco, RN 3:00 PM

## 2016-02-21 NOTE — Progress Notes (Signed)
No complaints today.   Pap Smear:  Pap smear not completed today. Last Pap smear was 08/25/2014 at [place] and normal with negative HPV. Per patient has no history of an abnormal Pap smear.   Physical exam: Breasts Breasts symmetrical. No skin abnormalities bilateral breasts. No nipple retraction bilateral breasts. No nipple discharge bilateral breasts. No lymphadenopathy. No lumps palpated bilateral breasts. No complaints of pain or tenderness on exam. Referred patient to the [facility] for diagnostic mammogram. Appointment scheduled for Thursday, Feb 21, 2016 at 1150.  Pelvic/Bimanual No Pap smear completed today since last Pap smear was 08/25/2014. Pap smear not indicated per [facility] guidelines.   Smoking History: Patient has never smoked.  Patient Navigation: Patient education provided. Access to services provided for patient through [facility] program.   Colorectal Cancer Screening: Patient had a colonoscopy completed around 20 years ago. No complaints today.

## 2016-02-22 ENCOUNTER — Encounter (HOSPITAL_COMMUNITY): Payer: Self-pay | Admitting: *Deleted

## 2016-02-22 ENCOUNTER — Other Ambulatory Visit: Payer: Self-pay | Admitting: Family Medicine

## 2016-02-22 DIAGNOSIS — R928 Other abnormal and inconclusive findings on diagnostic imaging of breast: Secondary | ICD-10-CM

## 2016-02-26 ENCOUNTER — Telehealth: Payer: Self-pay | Admitting: *Deleted

## 2016-02-26 NOTE — Telephone Encounter (Signed)
-----   Message from Boykin Nearing, MD sent at 02/21/2016  4:13 PM EDT ----- Abnormal screening mammogram, possible L breast mass Will f/u additional screening mammogram

## 2016-02-26 NOTE — Telephone Encounter (Signed)
Patient returned nurse phone call.

## 2016-02-26 NOTE — Telephone Encounter (Signed)
LVM to return call.

## 2016-02-27 ENCOUNTER — Other Ambulatory Visit: Payer: Self-pay | Admitting: Obstetrics and Gynecology

## 2016-02-27 DIAGNOSIS — R928 Other abnormal and inconclusive findings on diagnostic imaging of breast: Secondary | ICD-10-CM

## 2016-02-29 ENCOUNTER — Ambulatory Visit
Admission: RE | Admit: 2016-02-29 | Discharge: 2016-02-29 | Disposition: A | Discharge status: Home / Self Care | Payer: No Typology Code available for payment source | Source: Ambulatory Visit | Attending: Family Medicine | Admitting: Family Medicine

## 2016-02-29 DIAGNOSIS — R928 Other abnormal and inconclusive findings on diagnostic imaging of breast: Secondary | ICD-10-CM

## 2016-03-02 ENCOUNTER — Encounter: Payer: Self-pay | Admitting: Family Medicine

## 2016-03-02 DIAGNOSIS — N63 Unspecified lump in unspecified breast: Secondary | ICD-10-CM | POA: Insufficient documentation

## 2016-03-14 MED FILL — traZODone HCL 50 MG TABS: 50 | 30 days supply | Qty: 30 | Fill #2

## 2016-03-20 MED FILL — ?AMLODIPINE BESYLATE 5 MG T: 5 | 30 days supply | Qty: 30 | Fill #8

## 2016-04-14 MED FILL — traZODone HCL 50 MG TABS: 50 | 30 days supply | Qty: 30 | Fill #3

## 2016-04-14 MED FILL — ?BUSPIRONE HCL 15 MG TABLET: 15 | 30 days supply | Qty: 90 | Fill #1

## 2016-04-17 MED FILL — ?AMLODIPINE BESYLATE 5 MG T: 5 | 30 days supply | Qty: 30 | Fill #9

## 2016-05-15 MED FILL — traZODone HCL 50 MG TABS: 50 | 30 days supply | Qty: 30 | Fill #4

## 2016-05-19 MED FILL — ?AMLODIPINE BESYLATE 5 MG T: 5 | 30 days supply | Qty: 30 | Fill #10

## 2016-06-18 MED FILL — traZODone HCL 50 MG TABS: 50 | 30 days supply | Qty: 30 | Fill #5

## 2016-06-18 MED FILL — ?AMLODIPINE BESYLATE 5 MG T: 5 | 30 days supply | Qty: 30 | Fill #11

## 2016-07-04 MED FILL — busPIRone HCL 15 MG TABS: 15 | 30 days supply | Qty: 90 | Fill #2

## 2016-07-07 ENCOUNTER — Ambulatory Visit: Payer: Self-pay | Attending: Family Medicine | Admitting: Family Medicine

## 2016-07-07 ENCOUNTER — Encounter: Payer: Self-pay | Admitting: Family Medicine

## 2016-07-07 VITALS — BP 135/80 | HR 90 | Temp 97.7°F | Resp 16 | Ht 64.0 in | Wt 163.0 lb

## 2016-07-07 DIAGNOSIS — F419 Anxiety disorder, unspecified: Secondary | ICD-10-CM

## 2016-07-07 DIAGNOSIS — Z23 Encounter for immunization: Secondary | ICD-10-CM

## 2016-07-07 MED ORDER — BUSPIRONE HCL 15 MG PO TABS
15.0000 mg | ORAL_TABLET | Freq: Three times a day (TID) | ORAL | 3 refills | Status: DC
Start: 2016-07-07 — End: 2016-07-07

## 2016-07-07 MED ORDER — FLUOXETINE HCL 20 MG PO TABS: 20.0000 mg | ORAL_TABLET | Freq: Every day | ORAL | 3 refills | Status: DC

## 2016-07-07 MED ORDER — BUSPIRONE HCL 15 MG PO TABS
15.0000 mg | ORAL_TABLET | Freq: Two times a day (BID) | ORAL | 3 refills | Status: DC
Start: 2016-07-07 — End: 2016-11-13

## 2016-07-07 NOTE — Assessment & Plan Note (Addendum)
A: anxiety declined in setting of medicine non compliance P: Increase buspar to BID Restart prozac 20 mg daily  Counseling services recommended handout provided

## 2016-07-07 NOTE — Progress Notes (Signed)
F/U unable to sleep  No pain today today  No tobacco user  No suicidal thought in the past two weeks  Flu Inj given today

## 2016-07-07 NOTE — Patient Instructions (Addendum)
Kimberly [name] was seen today for anxiety.  Diagnoses and all orders for this visit:  Chronic anxiety -     FLUoxetine (PROZAC) 20 MG tablet; Take 1 tablet (20 mg total) by mouth daily. -     Discontinue: busPIRone (BUSPAR) 15 MG tablet; Take 1 tablet (15 mg total) by mouth 3 (three) times daily. -     busPIRone (BUSPAR) 15 MG tablet; Take 1 tablet (15 mg total) by mouth 2 (two) times daily.  Encounter for immunization -     Flu Vaccine QUAD 36+ mos IM   You are due for f/u Left diagnostic mammogram and ultrasound in November this will be scheduled via the breast imaging center  Counseling services available at [facility] [place], [place] and [place].   Start back on prozac 20 mg daily Increase buspar to 15 mg twice  daily, at least twice daily   F/u in 4 weeks for anxiety   Dr. Adrian Blackwater

## 2016-07-07 NOTE — Progress Notes (Signed)
Patient ID: Kimberly [name], female   DOB: August 15, 1956, 60 y.o.   MRN: [id number]   Subjective:  Patient ID: Kimberly [name], female    DOB: 1956/07/16  Age: 60 y.o. MRN: [id number]  CC: Anxiety   HPI Kimberly [name] presents for   1. Anxiety: stable. Worsening. She is taking buspar at night only. She is not taking zoloft and she cannot remember that last time she took it. She is having trouble sleeping. She is amenable to counseling services.     Social History  Substance Use Topics  . Smoking status: Never Smoker  . Smokeless tobacco: Not on file  . Alcohol use No   Outpatient Medications Prior to Visit  Medication Sig Dispense Refill  . amLODipine (NORVASC) 5 MG tablet Take 1 tablet (5 mg total) by mouth daily. 90 tablet 3  . busPIRone (BUSPAR) 15 MG tablet Take 1 tablet (15 mg total) by mouth 3 (three) times daily. 90 tablet 5  . FLUoxetine (PROZAC) 20 MG tablet Take 1 tablet (20 mg total) by mouth daily. 30 tablet 5  . ibuprofen (ADVIL,MOTRIN) 600 MG tablet TAKE 1 TABLET BY MOUTH EVERY 8 HOURS AS NEEDED. 30 tablet 0  . Multiple Vitamin (MULTIVITAMIN WITH MINERALS) TABS tablet Take 1 tablet by mouth daily. Reported on 09/28/2015    . traZODone (DESYREL) 50 MG tablet Take 1 tablet (50 mg total) by mouth at bedtime as needed for sleep. 30 tablet 5  . cetirizine (ZYRTEC) 10 MG tablet Take 1 tablet (10 mg total) by mouth daily. (Patient not taking: Reported on 07/07/2016) 30 tablet 11  . diclofenac sodium (VOLTAREN) 1 % GEL Apply 4 g topically 4 (four) times daily. (Patient not taking: Reported on 07/07/2016) 100 g 2  . Melatonin 1 MG CAPS Take by mouth. Reported on 09/28/2015     No facility-administered medications prior to visit.     ROS Review of Systems  Constitutional: Negative for chills and fever.  HENT: Negative for congestion and postnasal drip.   Eyes: Negative for visual disturbance.  Respiratory: Negative for shortness of breath.   Cardiovascular: Negative for chest  pain.  Gastrointestinal: Negative for abdominal pain, blood in stool and nausea.  Musculoskeletal: Positive for arthralgias (R shoulder ). Negative for back pain.  Skin: Negative for rash.  Allergic/Immunologic: Negative for immunocompromised state.  Hematological: Negative for adenopathy. Does not bruise/bleed easily.  Psychiatric/Behavioral: Positive for dysphoric mood and sleep disturbance. Negative for self-injury and suicidal ideas. The patient is nervous/anxious.     Objective:  BP 135/80 (BP Location: Right Arm, Patient Position: Sitting, Cuff Size: Normal)   Pulse 90   Temp 97.7 F (36.5 C) (Oral)   Resp 16   Ht 5\' 4"  (1.626 m)   Wt 163 lb (73.9 kg)   SpO2 98%   BMI 27.98 kg/m   BP/Weight 07/07/2016 02/21/2016 0000000  Systolic BP A999333 A999333 123456  Diastolic BP 80 84 69  Wt. (Lbs) 163 162.8 157.4  BMI 27.98 27.09 27   Physical Exam  Constitutional: She is oriented to person, place, and time. She appears well-developed and well-nourished. No distress.  HENT:  Head: Normocephalic and atraumatic.  Neck: No thyromegaly present.  Cardiovascular: Normal rate, regular rhythm, normal heart sounds and intact distal pulses.   Pulmonary/Chest: Effort normal and breath sounds normal.  Musculoskeletal: She exhibits no edema.       Right shoulder: She exhibits normal range of motion, no tenderness, no bony tenderness, no swelling, no effusion, no crepitus, no  deformity, no laceration, no pain, no spasm, normal pulse and normal strength.  Neurological: She is alert and oriented to person, place, and time.  Skin: Skin is warm and dry. No rash noted.  Psychiatric: She has a normal mood and affect. She does not exhibit a depressed mood.     Depression screen [facility] 2/9 07/07/2016 01/07/2016 09/28/2015 08/20/2015 07/06/2015  Decreased Interest 1 0 2 0 0  Down, Depressed, Hopeless 1 2 2 1 1   PHQ - 2 Score 2 2 4 1 1   Altered sleeping 3 2 3 3  -  Tired, decreased energy 1 2 0 1 -  Change in  appetite 0 0 0 1 -  Feeling bad or failure about yourself  3 2 1 2  -  Trouble concentrating 0 0 0 0 -  Moving slowly or fidgety/restless 0 0 0 0 -  Suicidal thoughts 0 0 0 0 -  PHQ-9 Score 9 8 8 8  -    GAD 7 : Generalized Anxiety Score 07/07/2016 01/07/2016 09/28/2015 08/20/2015  Nervous, Anxious, on Edge 3 3 3 2   Control/stop worrying 3 2 2 2   Worry too much - different things 3 2 3 2   Trouble relaxing 2 1 2 1   Restless 0 0 1 0  Easily annoyed or irritable 3 2 2 2   Afraid - awful might happen 1 1 1 1   Total GAD 7 Score 15 11 14 10     Assessment & Plan:   Problem List Items Addressed This Visit      High   Chronic anxiety - Primary (Chronic)   Relevant Medications   FLUoxetine (PROZAC) 20 MG tablet   busPIRone (BUSPAR) 15 MG tablet    Other Visit Diagnoses    Encounter for immunization       Relevant Orders   Flu Vaccine QUAD 36+ mos IM (Completed)      No orders of the defined types were placed in this encounter.   Follow-up: Return in about 4 weeks (around 08/04/2016) for anxiety .   Boykin Nearing MD

## 2016-07-16 ENCOUNTER — Other Ambulatory Visit: Payer: Self-pay | Admitting: Family Medicine

## 2016-07-16 DIAGNOSIS — G4700 Insomnia, unspecified: Secondary | ICD-10-CM

## 2016-07-16 DIAGNOSIS — I10 Essential (primary) hypertension: Secondary | ICD-10-CM

## 2016-07-16 MED FILL — AMLODIPINE BESYLATE 5 MG TA: 5 | 30 days supply | Qty: 30 | Fill #0

## 2016-07-16 MED FILL — traZODone HCL 50 MG TABS: 50 | 30 days supply | Qty: 30 | Fill #3

## 2016-07-18 MED FILL — FLUoxetine HCL 20 MG CAPS: 20 | 30 days supply | Qty: 30 | Fill #1

## 2016-08-04 ENCOUNTER — Other Ambulatory Visit: Payer: Self-pay | Admitting: Obstetrics and Gynecology

## 2016-08-04 DIAGNOSIS — N6320 Unspecified lump in the left breast, unspecified quadrant: Secondary | ICD-10-CM

## 2016-08-11 ENCOUNTER — Ambulatory Visit: Payer: Self-pay | Attending: Family Medicine | Admitting: Family Medicine

## 2016-08-11 ENCOUNTER — Encounter: Payer: Self-pay | Admitting: Licensed Clinical Social Worker

## 2016-08-11 ENCOUNTER — Encounter: Payer: Self-pay | Admitting: Family Medicine

## 2016-08-11 VITALS — BP 124/81 | HR 80 | Temp 98.0°F | Ht 64.0 in | Wt 162.0 lb

## 2016-08-11 DIAGNOSIS — I10 Essential (primary) hypertension: Secondary | ICD-10-CM | POA: Insufficient documentation

## 2016-08-11 DIAGNOSIS — F329 Major depressive disorder, single episode, unspecified: Secondary | ICD-10-CM | POA: Insufficient documentation

## 2016-08-11 DIAGNOSIS — Z79899 Other long term (current) drug therapy: Secondary | ICD-10-CM | POA: Insufficient documentation

## 2016-08-11 DIAGNOSIS — G4700 Insomnia, unspecified: Secondary | ICD-10-CM | POA: Insufficient documentation

## 2016-08-11 DIAGNOSIS — F5104 Psychophysiologic insomnia: Secondary | ICD-10-CM

## 2016-08-11 DIAGNOSIS — F419 Anxiety disorder, unspecified: Secondary | ICD-10-CM | POA: Insufficient documentation

## 2016-08-11 DIAGNOSIS — Z23 Encounter for immunization: Secondary | ICD-10-CM

## 2016-08-11 MED ORDER — TRAZODONE HCL 50 MG PO TABS: 50.0000 mg | ORAL_TABLET | Freq: Every evening | ORAL | 3 refills | Status: DC | PRN

## 2016-08-11 MED FILL — traZODone HCL 50 MG TABS: 50 | 90 days supply | Qty: 90 | Fill #0

## 2016-08-11 NOTE — Assessment & Plan Note (Addendum)
A: chronic anxiety  P: Taking buspar and prozac Continue Add therapy, referred to CSW Recommended regular exercise and healthy varied diet

## 2016-08-11 NOTE — Progress Notes (Signed)
Pt is here to follow up on anxiety.  Pt is getting tdap shot today.

## 2016-08-11 NOTE — Progress Notes (Signed)
Patient ID: Kimberly [name], female   DOB: 1956/03/03, 60 y.o.   MRN: [id number]   Subjective:  Patient ID: Kimberly [name], female    DOB: 1956-01-06  Age: 60 y.o. MRN: [id number]  CC: Follow-up   HPI Kimberly [name] presents for   1. Anxiety: started in 1995. Stable. She is not involved in therapy but talks with her neighbor. Worsening. She is taking buspar at night only. She is not taking zoloft and she cannot remember that last time she took it. She is having trouble sleeping. She is amenable to counseling services.   2. HTN; she is compliant with amlodipine 5 mg daily. No HA, CP or SOB, no leg swelling.   Past Medical History:  Diagnosis Date  . Anxiety 1995  . Depression 1995  . Hypertension Dx Dec 2015  . Panic attack 1995     Social History  Substance Use Topics  . Smoking status: Never Smoker  . Smokeless tobacco: Not on file  . Alcohol use No   Outpatient Medications Prior to Visit  Medication Sig Dispense Refill  . amLODipine (NORVASC) 5 MG tablet TAKE 1 TABLET BY MOUTH DAILY 90 tablet 3  . busPIRone (BUSPAR) 15 MG tablet Take 1 tablet (15 mg total) by mouth 2 (two) times daily. 180 tablet 3  . FLUoxetine (PROZAC) 20 MG tablet Take 1 tablet (20 mg total) by mouth daily. 90 tablet 3  . ibuprofen (ADVIL,MOTRIN) 600 MG tablet TAKE 1 TABLET BY MOUTH EVERY 8 HOURS AS NEEDED. 30 tablet 0  . Multiple Vitamin (MULTIVITAMIN WITH MINERALS) TABS tablet Take 1 tablet by mouth daily. Reported on 09/28/2015    . traZODone (DESYREL) 50 MG tablet TAKE 1 TABLET BY MOUTH AT BEDTIME AS NEEDED FOR SLEEP. 30 tablet 5  . Melatonin 1 MG CAPS Take by mouth. Reported on 09/28/2015     No facility-administered medications prior to visit.     ROS Review of Systems  Constitutional: Negative for chills and fever.  HENT: Negative for congestion and postnasal drip.   Eyes: Negative for visual disturbance.  Respiratory: Negative for shortness of breath.   Cardiovascular: Negative for chest  pain.  Gastrointestinal: Negative for abdominal pain, blood in stool and nausea.  Musculoskeletal: Positive for arthralgias (R shoulder ). Negative for back pain.  Skin: Negative for rash.  Allergic/Immunologic: Negative for immunocompromised state.  Hematological: Negative for adenopathy. Does not bruise/bleed easily.  Psychiatric/Behavioral: Positive for dysphoric mood and sleep disturbance. Negative for self-injury and suicidal ideas. The patient is nervous/anxious.     Objective:  BP 124/81 (BP Location: Left Arm, Patient Position: Sitting, Cuff Size: Small)   Pulse 80   Temp 98 F (36.7 C) (Oral)   Ht 5\' 4"  (1.626 m)   Wt 162 lb (73.5 kg)   SpO2 97%   BMI 27.81 kg/m   BP/Weight 08/11/2016 07/07/2016 [id number]  Systolic BP A999333 A999333 A999333  Diastolic BP 81 80 84  Wt. (Lbs) 162 163 162.8  BMI 27.81 27.98 27.09   Physical Exam  Constitutional: She is oriented to person, place, and time. She appears well-developed and well-nourished. No distress.  HENT:  Head: Normocephalic and atraumatic.  Neck: No thyromegaly present.  Cardiovascular: Normal rate, regular rhythm, normal heart sounds and intact distal pulses.   Pulmonary/Chest: Effort normal and breath sounds normal.  Musculoskeletal: She exhibits no edema.       Right shoulder: She exhibits normal range of motion, no tenderness, no bony tenderness, no swelling, no effusion, no crepitus,  no deformity, no laceration, no pain, no spasm, normal pulse and normal strength.  Neurological: She is alert and oriented to person, place, and time.  Skin: Skin is warm and dry. No rash noted.  Psychiatric: She has a normal mood and affect. She does not exhibit a depressed mood.    Depression screen [facility] 2/9 08/11/2016 07/07/2016 01/07/2016 09/28/2015 08/20/2015  Decreased Interest 0 1 0 2 0  Down, Depressed, Hopeless 1 1 2 2 1   PHQ - 2 Score 1 2 2 4 1   Altered sleeping 3 3 2 3 3   Tired, decreased energy 0 1 2 0 1  Change in appetite 0 0 0 0 1   Feeling bad or failure about yourself  1 3 2 1 2   Trouble concentrating 0 0 0 0 0  Moving slowly or fidgety/restless 0 0 0 0 0  Suicidal thoughts 0 0 0 0 0  PHQ-9 Score 5 9 8 8 8     GAD 7 : Generalized Anxiety Score 08/11/2016 07/07/2016 01/07/2016 09/28/2015  Nervous, Anxious, on Edge 3 3 3 3   Control/stop worrying 2 3 2 2   Worry too much - different things 2 3 2 3   Trouble relaxing 1 2 1 2   Restless 0 0 0 1  Easily annoyed or irritable 2 3 2 2   Afraid - awful might happen 1 1 1 1   Total GAD 7 Score 11 15 11 14     Assessment & Plan:   Problem List Items Addressed This Visit      High   Insomnia (Chronic)   Relevant Medications   traZODone (DESYREL) 50 MG tablet   HTN (hypertension) (Chronic)    A: HTN, well controlled Med: compliant P: Continue current regimen      Chronic anxiety - Primary (Chronic)    A: chronic anxiety  P: Taking buspar and prozac Continue Add therapy, referred to CSW Recommended regular exercise and healthy varied diet       Relevant Medications   traZODone (DESYREL) 50 MG tablet    Other Visit Diagnoses   None.     No orders of the defined types were placed in this encounter.   Follow-up: No Follow-up on file.   Boykin Nearing MD

## 2016-08-11 NOTE — Assessment & Plan Note (Signed)
A: HTN, well controlled Med: compliant P: Continue current regimen

## 2016-08-11 NOTE — Addendum Note (Signed)
Addended by: Gomez Cleverly on: 08/11/2016 12:07 PM   Modules accepted: Orders

## 2016-08-11 NOTE — Patient Instructions (Addendum)
Steph was seen today for follow-up.  Diagnoses and all orders for this visit:  Chronic anxiety  Psychophysiological insomnia -     traZODone (DESYREL) 50 MG tablet; Take 1 tablet (50 mg total) by mouth at bedtime as needed. for sleep  Essential hypertension

## 2016-08-11 NOTE — BH Specialist Note (Signed)
Session Start time: 12:00 pm   End Time: 12:20 pm Total Time:  20 minutes Type of Service: [place] Interpreter: No.   Interpreter Name & Language: N/A # [facility] Visits July 2017-June 2018: 1st   SUBJECTIVE: Kimberly [name] is a 60 y.o. female  Pt. was referred by Dr. Adrian Blackwater for:  anxiety. Pt. reports the following symptoms/concerns: tremors, difficulty sleeping, excessive worrying, hx of panic attacks (last one occurred years ago) Duration of problem:  1995 Severity: moderate Previous treatment: Pt currently taking prescribed medication for anxiety. Pt used to attend mental health clinic "back in the day".   OBJECTIVE: Mood: Anxious & Affect: Appropriate Risk of harm to self or others: Pt denied SI/HI Assessments administered: PHQ-9; GAD-7  LIFE CONTEXT:  Family & Social: Pt resides with adult daughter and five minor grandchildren. Pt has a close relationship with neighbor. Extended family resides out of state in [place] Attends a [employer]. School/ Work: Pt is unemployed. She stopped working to assist with caring for grandchildren Self-Care: Pt takes prescribed medication to assist with sleep.  Life changes: None reported What is important to pt/family (values): Family, Spirituality   GOALS ADDRESSED:  Decrease symptoms of anxiety  INTERVENTIONS: Motivational Interviewing, Strength-based and Supportive   ASSESSMENT:  Pt currently experiencing anxiety. Pt reported tremors, difficulty sleeping, excessive worrying, and hx of panic attacks.  Pt may benefit from psychoeducation and psychotherapy. LCSWA discussed healthy coping skills to address anxiety with pt, who agreed to implement interventions when experiencing anxiety.      PLAN: 1. F/U with behavioral health clinician: Pt was encouraged to contact LCSWA if symptoms worsen or fail to improve to schedule behavioral appointments at [facility]. 2. Behavioral Health meds: Buspar and Prozac 3. Behavioral  recommendations: LCSWA recommends that pt apply healthy coping skills discussed. Pt is encouraged to schedule follow up appointment with LCSWA 4. Referral: Brief Counseling/Psychotherapy, Psychoeducation and Supportive Counseling 5. From scale of 1-10, how likely are you to follow plan: 7/10   Rebekah Chesterfield, MSW, [place] Worker 08/11/16 2:51 pm  Warmhandoff:   Warm Hand Off Completed.

## 2016-08-18 MED FILL — AMLODIPINE BESYLATE 5 MG TA: 5 | 90 days supply | Qty: 90 | Fill #1

## 2016-08-18 MED FILL — busPIRone HCL 15 MG TABS: 15 | 90 days supply | Qty: 180 | Fill #0

## 2016-08-18 MED FILL — FLUoxetine HCL 20 MG CAPS: 20 | 90 days supply | Qty: 90 | Fill #2

## 2016-09-01 ENCOUNTER — Ambulatory Visit
Admission: RE | Admit: 2016-09-01 | Discharge: 2016-09-01 | Disposition: A | Discharge status: Home / Self Care | Payer: No Typology Code available for payment source | Source: Ambulatory Visit | Attending: Obstetrics and Gynecology | Admitting: Obstetrics and Gynecology

## 2016-09-01 DIAGNOSIS — N6320 Unspecified lump in the left breast, unspecified quadrant: Secondary | ICD-10-CM

## 2016-11-13 ENCOUNTER — Ambulatory Visit: Payer: Self-pay | Attending: Family Medicine | Admitting: Family Medicine

## 2016-11-13 ENCOUNTER — Encounter: Payer: Self-pay | Admitting: Family Medicine

## 2016-11-13 VITALS — BP 114/79 | HR 100 | Temp 98.3°F | Ht 64.0 in | Wt 168.4 lb

## 2016-11-13 DIAGNOSIS — R209 Unspecified disturbances of skin sensation: Secondary | ICD-10-CM

## 2016-11-13 DIAGNOSIS — G4700 Insomnia, unspecified: Secondary | ICD-10-CM | POA: Insufficient documentation

## 2016-11-13 DIAGNOSIS — F410 Panic disorder [episodic paroxysmal anxiety] without agoraphobia: Secondary | ICD-10-CM | POA: Insufficient documentation

## 2016-11-13 DIAGNOSIS — Z79899 Other long term (current) drug therapy: Secondary | ICD-10-CM | POA: Insufficient documentation

## 2016-11-13 DIAGNOSIS — I10 Essential (primary) hypertension: Secondary | ICD-10-CM | POA: Insufficient documentation

## 2016-11-13 DIAGNOSIS — F5104 Psychophysiologic insomnia: Secondary | ICD-10-CM

## 2016-11-13 DIAGNOSIS — B353 Tinea pedis: Secondary | ICD-10-CM | POA: Insufficient documentation

## 2016-11-13 DIAGNOSIS — F329 Major depressive disorder, single episode, unspecified: Secondary | ICD-10-CM | POA: Insufficient documentation

## 2016-11-13 DIAGNOSIS — F419 Anxiety disorder, unspecified: Secondary | ICD-10-CM

## 2016-11-13 LAB — LIPID PANEL
CHOL/HDL RATIO: 2.7 ratio (ref <5.0)
CHOLESTEROL: 178 mg/dL (ref <200)
HDL: 66 mg/dL (ref >50)
LDL Cholesterol: 87 mg/dL (ref <100)
TRIGLYCERIDES: 127 mg/dL (ref <150)
VLDL: 25 mg/dL (ref <30)

## 2016-11-13 LAB — CBC
HEMATOCRIT: 43.4 % (ref 35.0–45.0)
Hemoglobin: 14.6 g/dL (ref 11.7–15.5)
MCH: 30.7 pg (ref 27.0–33.0)
MCHC: 33.6 g/dL (ref 32.0–36.0)
MCV: 91.2 fL (ref 80.0–100.0)
MPV: 8.8 fL (ref 7.5–12.5)
PLATELETS: 456 10*3/uL — AB (ref 140–400)
RBC: 4.76 MIL/uL (ref 3.80–5.10)
RDW: 13.8 % (ref 11.0–15.0)
WBC: 6 10*3/uL (ref 3.8–10.8)

## 2016-11-13 LAB — COMPLETE METABOLIC PANEL WITH GFR
ALBUMIN: 4.5 g/dL (ref 3.6–5.1)
ALK PHOS: 74 U/L (ref 33–130)
ALT: 12 U/L (ref 6–29)
AST: 15 U/L (ref 10–35)
BUN: 9 mg/dL (ref 7–25)
CALCIUM: 10.1 mg/dL (ref 8.6–10.4)
CO2: 25 mmol/L (ref 20–31)
Chloride: 104 mmol/L (ref 98–110)
Creat: 0.98 mg/dL (ref 0.50–0.99)
GFR, EST AFRICAN AMERICAN: 71 mL/min (ref >=60)
GFR, EST NON AFRICAN AMERICAN: 62 mL/min (ref >=60)
Glucose, Bld: 78 mg/dL (ref 65–99)
POTASSIUM: 4.5 mmol/L (ref 3.5–5.3)
Sodium: 140 mmol/L (ref 135–146)
Total Bilirubin: 0.3 mg/dL (ref 0.2–1.2)
Total Protein: 7.9 g/dL (ref 6.1–8.1)

## 2016-11-13 LAB — IRON AND TIBC
%SAT: 19 % (ref 11–50)
Iron: 75 ug/dL (ref 45–160)
TIBC: 402 ug/dL (ref 250–450)
UIBC: 327 ug/dL (ref 125–400)

## 2016-11-13 LAB — TSH: TSH: 1.44 mIU/L

## 2016-11-13 LAB — FERRITIN: FERRITIN: 61 ng/mL (ref 20–288)

## 2016-11-13 MED ORDER — TERBINAFINE HCL 1 % EX CREA: 1.0000 "application " | TOPICAL_CREAM | Freq: Two times a day (BID) | CUTANEOUS | 0 refills | Status: DC

## 2016-11-13 MED ORDER — AMLODIPINE BESYLATE 5 MG PO TABS: 5.0000 mg | ORAL_TABLET | Freq: Every day | ORAL | 3 refills | Status: DC

## 2016-11-13 MED ORDER — BUSPIRONE HCL 15 MG PO TABS: 15.0000 mg | ORAL_TABLET | Freq: Two times a day (BID) | ORAL | 3 refills | Status: DC

## 2016-11-13 MED ORDER — TRAZODONE HCL 50 MG PO TABS: 50.0000 mg | ORAL_TABLET | Freq: Every evening | ORAL | 3 refills | Status: DC | PRN

## 2016-11-13 MED ORDER — FLUOXETINE HCL 20 MG PO TABS: 20.0000 mg | ORAL_TABLET | Freq: Every day | ORAL | 3 refills | Status: DC

## 2016-11-13 MED FILL — traZODone HCL 50 MG TABS: 50 | 90 days supply | Qty: 90 | Fill #1

## 2016-11-13 MED FILL — AMLODIPINE BESYLATE 5 MG TA: 5 | 90 days supply | Qty: 90 | Fill #2

## 2016-11-13 MED FILL — FLUoxetine HCL 20 MG CAPS: 20 | 30 days supply | Qty: 30 | Fill #3

## 2016-11-13 MED FILL — busPIRone HCL 15 MG TABS: 15 | 90 days supply | Qty: 180 | Fill #1

## 2016-11-13 NOTE — Assessment & Plan Note (Signed)
Intermittent No skin changes or deformity Labs per ordered Advised patient keep warm with layers

## 2016-11-13 NOTE — Assessment & Plan Note (Signed)
Chronic Controlled with trazodone Continue current regimen

## 2016-11-13 NOTE — Progress Notes (Signed)
Patient ID: Kimberly [name], female   DOB: 1955-12-10, 61 y.o.   MRN: [id number]   Subjective:  Patient ID: Kimberly [name], female    DOB: 12-10-1955  Age: 61 y.o. MRN: [id number]  CC: Hypertension and Anxiety   HPI Luchana Tudor presents for   1. Anxiety: started in 1995. Stable. She is compliant with buspar, trazodone and prozac. Marland Kitchen She is having trouble sleeping some nights.   2. HTN: she is compliant with amlodipine 5 mg daily. No HA, CP or SOB, no leg swelling.   3. Cold: she endorses cold sensation in hands and feet that is severe at night. Denies GI bleed. No rash or skin changes. No joint deformity or swelling.   Past Medical History:  Diagnosis Date  . Anxiety 1995  . Depression 1995  . Hypertension Dx Dec 2015  . Panic attack 1995     Social History  Substance Use Topics  . Smoking status: Never Smoker  . Smokeless tobacco: Not on file  . Alcohol use No   Outpatient Medications Prior to Visit  Medication Sig Dispense Refill  . amLODipine (NORVASC) 5 MG tablet TAKE 1 TABLET BY MOUTH DAILY 90 tablet 3  . busPIRone (BUSPAR) 15 MG tablet Take 1 tablet (15 mg total) by mouth 2 (two) times daily. 180 tablet 3  . FLUoxetine (PROZAC) 20 MG tablet Take 1 tablet (20 mg total) by mouth daily. 90 tablet 3  . ibuprofen (ADVIL,MOTRIN) 600 MG tablet TAKE 1 TABLET BY MOUTH EVERY 8 HOURS AS NEEDED. 30 tablet 0  . Melatonin 1 MG CAPS Take by mouth. Reported on 09/28/2015    . Multiple Vitamin (MULTIVITAMIN WITH MINERALS) TABS tablet Take 1 tablet by mouth daily. Reported on 09/28/2015    . traZODone (DESYREL) 50 MG tablet Take 1 tablet (50 mg total) by mouth at bedtime as needed. for sleep 90 tablet 3   No facility-administered medications prior to visit.     ROS Review of Systems  Constitutional: Negative for chills and fever.  HENT: Negative for congestion and postnasal drip.   Eyes: Negative for visual disturbance.  Respiratory: Negative for shortness of breath.     Cardiovascular: Negative for chest pain.  Gastrointestinal: Negative for abdominal pain, blood in stool and nausea.  Musculoskeletal: Positive for arthralgias (R shoulder ). Negative for back pain.  Skin: Negative for rash.  Allergic/Immunologic: Negative for immunocompromised state.  Hematological: Negative for adenopathy. Does not bruise/bleed easily.  Psychiatric/Behavioral: Positive for dysphoric mood and sleep disturbance. Negative for self-injury and suicidal ideas. The patient is nervous/anxious.     Objective:  BP 114/79 (BP Location: Left Arm, Patient Position: Sitting, Cuff Size: Small)   Pulse 100   Temp 98.3 F (36.8 C) (Oral)   Ht 5\' 4"  (1.626 m)   Wt 168 lb 6.4 oz (76.4 kg)   SpO2 95%   BMI 28.91 kg/m   BP/Weight 11/13/2016 08/11/2016 0000000  Systolic BP 99991111 [id number] A999333  Diastolic BP 79 81 80  Wt. (Lbs) 168.4 162 163  BMI 28.91 27.81 27.98   Pulse Readings from Last 3 Encounters:  11/13/16 100  08/11/16 80  07/07/16 90    Physical Exam  Constitutional: She is oriented to person, place, and time. She appears well-developed and well-nourished. No distress.  HENT:  Head: Normocephalic and atraumatic.  Neck: No thyromegaly present.  Cardiovascular: Normal rate, regular rhythm, normal heart sounds and intact distal pulses.   Pulses:      Radial pulses are 2+ on  the right side, and 2+ on the left side.       Dorsalis pedis pulses are 2+ on the right side, and 2+ on the left side.  Pulmonary/Chest: Effort normal and breath sounds normal.  Musculoskeletal: She exhibits no edema.       Right shoulder: She exhibits normal range of motion, no tenderness, no bony tenderness, no swelling, no effusion, no crepitus, no deformity, no laceration, no pain, no spasm, normal pulse and normal strength.       Feet:  Neurological: She is alert and oriented to person, place, and time.  Skin: Skin is warm and dry. No rash noted.  Psychiatric: She has a normal mood and affect. She  does not exhibit a depressed mood.    Depression screen [facility] 2/9 11/13/2016 08/11/2016 07/07/2016 01/07/2016 09/28/2015  Decreased Interest 0 0 1 0 2  Down, Depressed, Hopeless 2 1 1 2 2   PHQ - 2 Score 2 1 2 2 4   Altered sleeping 2 3 3 2 3   Tired, decreased energy 1 0 1 2 0  Change in appetite 0 0 0 0 0  Feeling bad or failure about yourself  1 1 3 2 1   Trouble concentrating 0 0 0 0 0  Moving slowly or fidgety/restless 0 0 0 0 0  Suicidal thoughts 0 0 0 0 0  PHQ-9 Score 6 5 9 8 8     GAD 7 : Generalized Anxiety Score 11/13/2016 08/11/2016 07/07/2016 01/07/2016  Nervous, Anxious, on Edge 2 3 3 3   Control/stop worrying 1 2 3 2   Worry too much - different things 1 2 3 2   Trouble relaxing 1 1 2 1   Restless 0 0 0 0  Easily annoyed or irritable 1 2 3 2   Afraid - awful might happen 1 1 1 1   Total GAD 7 Score 7 11 15 11     Assessment & Plan:   Problem List Items Addressed This Visit      High   Insomnia (Chronic)    Chronic Controlled with trazodone Continue current regimen       Relevant Medications   traZODone (DESYREL) 50 MG tablet   HTN (hypertension) (Chronic)    Well controlled Continue current regimen       Relevant Medications   amLODipine (NORVASC) 5 MG tablet   Other Relevant Orders   COMPLETE METABOLIC PANEL WITH GFR   Lipid Panel   Chronic anxiety (Chronic)    Improved Continue current regimen       Relevant Medications   busPIRone (BUSPAR) 15 MG tablet   FLUoxetine (PROZAC) 20 MG tablet   traZODone (DESYREL) 50 MG tablet   Other Relevant Orders   TSH     Unprioritized   Tinea pedis of both feet    Refilled lamisil       Relevant Medications   terbinafine (LAMISIL AT) 1 % cream    Other Visit Diagnoses    Cold hands and feet    -  Primary   Relevant Orders   CBC   Iron and TIBC   Ferritin      No orders of the defined types were placed in this encounter.   Follow-up: Return in about 6 months (around 05/13/2017) for anxiety, HTN .    Boykin Nearing MD

## 2016-11-13 NOTE — Assessment & Plan Note (Signed)
Well-controlled.  Continue current regimen. 

## 2016-11-13 NOTE — Assessment & Plan Note (Signed)
Refilled lamisil

## 2016-11-13 NOTE — Patient Instructions (Addendum)
Kimberly [name] was seen today for hypertension and anxiety.  Diagnoses and all orders for this visit:  Cold hands and feet -     CBC -     Iron and TIBC -     Ferritin  Chronic anxiety -     TSH -     busPIRone (BUSPAR) 15 MG tablet; Take 1 tablet (15 mg total) by mouth 2 (two) times daily. -     FLUoxetine (PROZAC) 20 MG tablet; Take 1 tablet (20 mg total) by mouth daily.  Psychophysiological insomnia -     traZODone (DESYREL) 50 MG tablet; Take 1 tablet (50 mg total) by mouth at bedtime as needed. for sleep  Essential hypertension -     COMPLETE METABOLIC PANEL WITH GFR -     amLODipine (NORVASC) 5 MG tablet; Take 1 tablet (5 mg total) by mouth daily. -     Lipid Panel   F/u in 6 months for hypertension and anxiety, sooner if needed  Dr. Adrian Blackwater

## 2016-11-13 NOTE — Assessment & Plan Note (Signed)
Improved.  Continue current regimen

## 2016-11-14 ENCOUNTER — Telehealth: Payer: Self-pay

## 2016-11-14 NOTE — Telephone Encounter (Signed)
Pt was called and informed of lab results. 

## 2016-12-15 MED FILL — FLUoxetine HCL 20 MG CAPS: 20 | 30 days supply | Qty: 30 | Fill #0

## 2017-02-11 ENCOUNTER — Other Ambulatory Visit: Payer: Self-pay | Admitting: Family Medicine

## 2017-02-11 DIAGNOSIS — G4700 Insomnia, unspecified: Secondary | ICD-10-CM

## 2017-02-11 MED FILL — AMLODIPINE BESYLATE 5 MG TA: 5 | 90 days supply | Qty: 90 | Fill #3

## 2017-02-11 MED FILL — FLUoxetine HCL 20 MG CAPS: 20 | 90 days supply | Qty: 90 | Fill #1

## 2017-02-19 ENCOUNTER — Other Ambulatory Visit (HOSPITAL_COMMUNITY): Payer: Self-pay | Admitting: *Deleted

## 2017-02-19 DIAGNOSIS — R928 Other abnormal and inconclusive findings on diagnostic imaging of breast: Secondary | ICD-10-CM

## 2017-02-25 ENCOUNTER — Encounter: Payer: Self-pay | Admitting: Family Medicine

## 2017-02-26 ENCOUNTER — Ambulatory Visit (HOSPITAL_COMMUNITY): Payer: Self-pay

## 2017-03-05 ENCOUNTER — Ambulatory Visit: Admission: RE | Admit: 2017-03-05 | Payer: Self-pay | Source: Ambulatory Visit

## 2017-03-05 ENCOUNTER — Encounter (HOSPITAL_COMMUNITY): Payer: Self-pay

## 2017-03-05 ENCOUNTER — Ambulatory Visit: Payer: Self-pay

## 2017-03-05 ENCOUNTER — Ambulatory Visit (HOSPITAL_COMMUNITY)
Admission: RE | Admit: 2017-03-05 | Discharge: 2017-03-05 | Disposition: A | Discharge status: Home / Self Care | Payer: Self-pay | Source: Ambulatory Visit | Attending: Obstetrics and Gynecology | Admitting: Obstetrics and Gynecology

## 2017-03-05 VITALS — BP 120/82 | Temp 98.2°F | Ht 64.0 in | Wt 164.2 lb

## 2017-03-05 DIAGNOSIS — Z1239 Encounter for other screening for malignant neoplasm of breast: Secondary | ICD-10-CM

## 2017-03-05 NOTE — Patient Instructions (Signed)
Explained breast self awareness with [name]. Patient did not need a Pap smear today due to last Pap smear and HPV typing was 08/25/2014. Let her know [facility] will cover Pap smears and HPV typing every 5 years unless has a history of abnormal Pap smears. Referred patient to the [facility] for diagnostic mammogram and left breast ultrasound per recommendation. Appointment scheduled for Tuesday, Mar 10, 2017 at 1150. Patient aware of appointment and will be there. [name] verbalized understanding.  Stefano Trulson, Arvil Chaco, RN 1:52 PM

## 2017-03-05 NOTE — Progress Notes (Signed)
Patient referred to [facility] by the Breast Center due to recommending 6 month left breast diagnostic mammogram and ultrasound. Bilateral Breast diagnostic mammogram and left breast ultrasound completed 09/01/2016.  Pap Smear:  Pap smear not completed today. Last Pap smear was 08/25/2014 at [facility] and normal with negative HPV. Per patient has no history of an abnormal Pap smear. Last Pap smear result is in EPIC.  Physical exam: Breasts Breasts symmetrical. No skin abnormalities bilateral breasts. No nipple retraction bilateral breasts. No nipple discharge bilateral breasts. No lymphadenopathy. No lumps palpated bilateral breasts. No complaints of pain or tenderness on exam. Referred patient to the [facility] for diagnostic mammogram and left breast ultrasound per recommendation. Appointment scheduled for Tuesday, Mar 10, 2017 at 1150.        Pelvic/Bimanual No Pap smear completed today since last Pap smear and HPV typing was 08/25/2014. Pap smear not indicated per [facility] guidelines.   Smoking History: Patient has never smoked.  Patient Navigation: Patient education provided. Access to services provided for patient through [facility] program.   Colorectal Cancer Screening: Per patient had a colonoscopy completed over 13 years ago. No complaints today. FIT Test given to patient to complete and return to [facility].

## 2017-03-10 ENCOUNTER — Ambulatory Visit
Admission: RE | Admit: 2017-03-10 | Discharge: 2017-03-10 | Disposition: A | Discharge status: Home / Self Care | Payer: Self-pay | Source: Ambulatory Visit | Attending: Obstetrics and Gynecology | Admitting: Obstetrics and Gynecology

## 2017-03-10 ENCOUNTER — Encounter (HOSPITAL_COMMUNITY): Payer: Self-pay | Admitting: *Deleted

## 2017-03-10 DIAGNOSIS — R928 Other abnormal and inconclusive findings on diagnostic imaging of breast: Secondary | ICD-10-CM

## 2017-04-08 MED FILL — traZODone HCL 50 MG TABS: 50 | 90 days supply | Qty: 90 | Fill #0

## 2017-04-08 MED FILL — busPIRone HCL 15 MG TABS: 15 | 30 days supply | Qty: 60 | Fill #0

## 2017-05-11 ENCOUNTER — Other Ambulatory Visit: Payer: Self-pay | Admitting: Family Medicine

## 2017-05-11 DIAGNOSIS — I10 Essential (primary) hypertension: Secondary | ICD-10-CM

## 2017-05-11 MED FILL — FLUoxetine HCL 20 MG CAPS: 20 | 90 days supply | Qty: 90 | Fill #2

## 2017-05-11 MED FILL — busPIRone HCL 15 MG TABS: 15 | 30 days supply | Qty: 60 | Fill #1

## 2017-05-11 MED FILL — AMLODIPINE BESYLATE 5 MG TA: 5 | 60 days supply | Qty: 60 | Fill #4

## 2017-06-09 MED FILL — busPIRone HCL 15 MG TABS: 15 | 30 days supply | Qty: 60 | Fill #2

## 2017-06-10 DIAGNOSIS — Z131 Encounter for screening for diabetes mellitus: Secondary | ICD-10-CM | POA: Insufficient documentation

## 2017-06-11 ENCOUNTER — Encounter: Payer: Self-pay | Admitting: Internal Medicine

## 2017-06-11 ENCOUNTER — Ambulatory Visit: Payer: Self-pay | Attending: Internal Medicine | Admitting: Internal Medicine

## 2017-06-11 VITALS — BP 106/72 | HR 87 | Temp 98.2°F | Resp 16 | Wt 170.4 lb

## 2017-06-11 DIAGNOSIS — M2550 Pain in unspecified joint: Secondary | ICD-10-CM

## 2017-06-11 DIAGNOSIS — F5101 Primary insomnia: Secondary | ICD-10-CM | POA: Insufficient documentation

## 2017-06-11 DIAGNOSIS — F419 Anxiety disorder, unspecified: Secondary | ICD-10-CM | POA: Insufficient documentation

## 2017-06-11 DIAGNOSIS — Z131 Encounter for screening for diabetes mellitus: Secondary | ICD-10-CM

## 2017-06-11 DIAGNOSIS — I10 Essential (primary) hypertension: Secondary | ICD-10-CM | POA: Insufficient documentation

## 2017-06-11 DIAGNOSIS — B353 Tinea pedis: Secondary | ICD-10-CM | POA: Insufficient documentation

## 2017-06-11 DIAGNOSIS — N63 Unspecified lump in unspecified breast: Secondary | ICD-10-CM

## 2017-06-11 DIAGNOSIS — N6311 Unspecified lump in the right breast, upper outer quadrant: Secondary | ICD-10-CM | POA: Insufficient documentation

## 2017-06-11 DIAGNOSIS — Z23 Encounter for immunization: Secondary | ICD-10-CM

## 2017-06-11 DIAGNOSIS — J302 Other seasonal allergic rhinitis: Secondary | ICD-10-CM | POA: Insufficient documentation

## 2017-06-11 LAB — POCT GLYCOSYLATED HEMOGLOBIN (HGB A1C): Hemoglobin A1C: 5.4

## 2017-06-11 MED ORDER — CEPHALEXIN 500 MG PO CAPS: 500.0000 mg | ORAL_CAPSULE | Freq: Four times a day (QID) | ORAL | 0 refills | Status: DC

## 2017-06-11 MED FILL — CEPHALEXIN 500 MG CAPSULE: 500 | 7 days supply | Qty: 28 | Fill #0

## 2017-06-11 NOTE — Patient Instructions (Addendum)
Please give forms for [facility] card/[facility] discount and appointment with Ms. Luciana Axe  Take the antibiotic as prescribed.   Influenza Virus Vaccine injection (Fluarix) What is this medicine? INFLUENZA VIRUS VACCINE (in floo EN zuh VAHY ruhs vak SEEN) helps to reduce the risk of getting influenza also known as the flu. This medicine may be used for other purposes; ask your health care provider or pharmacist if you have questions. COMMON BRAND NAME(S): Fluarix, Fluzone What should I tell my health care provider before I take this medicine? They need to know if you have any of these conditions: -bleeding disorder like hemophilia -fever or infection -Guillain-Barre syndrome or other neurological problems -immune system problems -infection with the human immunodeficiency virus (HIV) or AIDS -low blood platelet counts -multiple sclerosis -an unusual or allergic reaction to influenza virus vaccine, eggs, chicken proteins, latex, gentamicin, other medicines, foods, dyes or preservatives -pregnant or trying to get pregnant -breast-feeding How should I use this medicine? This vaccine is for injection into a muscle. It is given by a health care professional. A copy of Vaccine Information Statements will be given before each vaccination. Read this sheet carefully each time. The sheet may change frequently. Talk to your pediatrician regarding the use of this medicine in children. Special care may be needed. Overdosage: If you think you have taken too much of this medicine contact a poison control center or emergency room at once. NOTE: This medicine is only for you. Do not share this medicine with others. What if I miss a dose? This does not apply. What may interact with this medicine? -chemotherapy or radiation therapy -medicines that lower your immune system like etanercept, anakinra, infliximab, and adalimumab -medicines that treat or prevent blood clots like warfarin -phenytoin -steroid medicines  like prednisone or cortisone -theophylline -vaccines This list may not describe all possible interactions. Give your health care provider a list of all the medicines, herbs, non-prescription drugs, or dietary supplements you use. Also tell them if you smoke, drink alcohol, or use illegal drugs. Some items may interact with your medicine. What should I watch for while using this medicine? Report any side effects that do not go away within 3 days to your doctor or health care professional. Call your health care provider if any unusual symptoms occur within 6 weeks of receiving this vaccine. You may still catch the flu, but the illness is not usually as bad. You cannot get the flu from the vaccine. The vaccine will not protect against colds or other illnesses that may cause fever. The vaccine is needed every year. What side effects may I notice from receiving this medicine? Side effects that you should report to your doctor or health care professional as soon as possible: -allergic reactions like skin rash, itching or hives, swelling of the face, lips, or tongue Side effects that usually do not require medical attention (report to your doctor or health care professional if they continue or are bothersome): -fever -headache -muscle aches and pains -pain, tenderness, redness, or swelling at site where injected -weak or tired This list may not describe all possible side effects. Call your doctor for medical advice about side effects. You may report side effects to FDA at 1-800-FDA-1088. Where should I keep my medicine? This vaccine is only given in a clinic, pharmacy, doctor's office, or other health care setting and will not be stored at home. NOTE: This sheet is a summary. It may not cover all possible information. If you have questions about this medicine,  talk to your doctor, pharmacist, or health care provider.  2018 Elsevier/Gold Standard (2008-04-26 09:30:40)

## 2017-06-11 NOTE — Progress Notes (Signed)
Patient ID: Kimberly [name], female    DOB: 1955-11-10  MRN: [id number]  CC: re-establish; Hypertension; and Cyst   Subjective: Kimberly [name] is a 61 y.o. female who presents for chronic ds management. Last saw Dr. Adrian Blackwater 11/2016 Her concerns today include:  Pt with hx of anxiety, HTN and insomnia.  1. Lump RT breast -had a bump in the upper outer quad for 2-3 yrs -increased in size 3 days ago.  Not painful unless touch, + redness. Last MMG 03/10/2017 where she had detailed imaging for lesions seen in the left breast. 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 Stable likely benign masses involving the left breast dating back to May, 2017. 2. No mammographic evidence of malignancy, right breast. [facility]  2. HTN -compliant with Norvasc. Limits salt -no CP/SOB/LE edema -exercise: walks her dogs QOD  3. 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 Thinks she has it again because of pain in knees, hands intermittently -"they do not hurt all the time. Only if I do a lot of walking."  -no jt swelling or stuffness -no fever, no wgh loss.  Gained wgh "I need to lay off of sweets."  4. Anxiety:  "My nerves is off the chart. I be nervous all the time. As I get older, its been worser." -do not sleep as sound as she use to. Gets up about 2 x a night to use bathroom -On Buspar and Prozac for anxiety which she states helps some.  Takes trazodone at nights when necessary  HM: given FIT test when she had MMG but has not done it as yet   Patient Active Problem List   Diagnosis Date Noted  . Diabetes mellitus screening 06/10/2017  . Cold hands and feet 11/13/2016  . Breast nodule 03/02/2016  . Seasonal allergies 01/07/2016  . Right shoulder pain 09/28/2015  . Insomnia 07/06/2015  . Tinea pedis of both feet 07/06/2015  . Nocturia 08/25/2014  . Chronic anxiety  08/25/2014  . HTN (hypertension) 08/16/2014     Current Outpatient Prescriptions on File Prior to Visit  Medication Sig Dispense Refill  . amLODipine (NORVASC) 5 MG tablet TAKE 1 TABLET BY MOUTH DAILY 90 tablet 0  . busPIRone (BUSPAR) 15 MG tablet Take 1 tablet (15 mg total) by mouth 2 (two) times daily. 180 tablet 3  . FLUoxetine (PROZAC) 20 MG tablet Take 1 tablet (20 mg total) by mouth daily. 90 tablet 3  . ibuprofen (ADVIL,MOTRIN) 600 MG tablet TAKE 1 TABLET BY MOUTH EVERY 8 HOURS AS NEEDED. 30 tablet 0  . Melatonin 1 MG CAPS Take by mouth. Reported on 09/28/2015    . Multiple Vitamin (MULTIVITAMIN WITH MINERALS) TABS tablet Take 1 tablet by mouth daily. Reported on 09/28/2015    . traZODone (DESYREL) 50 MG tablet Take 1 tablet (50 mg total) by mouth at bedtime as needed. for sleep 90 tablet 3   No current facility-administered medications on file prior to visit.     Allergies  Allergen Reactions  . Codeine Other (See Comments)    Reaction unknown    Social History   Social History  . Marital status: Single    Spouse name: N/A  . Number of children: N/A  . Years of education: N/A   Occupational History  . Not on file.   Social History Main Topics  . Smoking status: Never Smoker  .  Smokeless tobacco: Never Used  . Alcohol use No  . Drug use: No  . Sexual activity: Not on file   Other Topics Concern  . Not on file   Social History Narrative  . No narrative on file    Family History  Problem Relation Age of Onset  . Breast cancer Mother   . Cancer Maternal Grandmother   . Heart disease Paternal Grandfather     Past Surgical History:  Procedure Laterality Date  . APPENDECTOMY    . CHOLECYSTECTOMY    . TONSILLECTOMY      ROS: Review of Systems Negative except as stated above  PHYSICAL EXAM: BP 106/72   Pulse 87   Temp 98.2 F (36.8 C) (Oral)   Resp 16   Wt 170 lb 6.4 oz (77.3 kg)   SpO2 97%   BMI 29.25 kg/m   Wt Readings from Last 3  Encounters:  06/11/17 170 lb 6.4 oz (77.3 kg)  03/05/17 164 lb 3.2 oz (74.5 kg)  11/13/16 168 lb 6.4 oz (76.4 kg)   Physical Exam  General appearance - alert, well appearing, and in no distress Mental status -patient appears anxious with continuous fidgeting. Mouth - mucous membranes moist, pharynx normal without lesions Chest - clear to auscultation, no wheezes, rales or rhonchi, symmetric air entry Heart - normal rate, regular rhythm, normal S1, S2, no murmurs, rubs, clicks or gallops Breasts -RT breast: 2.5x 2 cm, raised, firm superficial mass with surrounding erythema in the upper outer quadrant Extremities - peripheral pulses normal, no pedal edema, no clubbing or cyanosis MSK: Both knee joints are enlarged. No point tenderness. Good range of motion. Hands: No signs of joint enlargement or active synovitis. good range of motion  Depression screen [facility] 2/9 11/13/2016 08/11/2016 07/07/2016  Decreased Interest 0 0 1  Down, Depressed, Hopeless 2 1 1   PHQ - 2 Score 2 1 2   Altered sleeping 2 3 3   Tired, decreased energy 1 0 1  Change in appetite 0 0 0  Feeling bad or failure about yourself  1 1 3   Trouble concentrating 0 0 0  Moving slowly or fidgety/restless 0 0 0  Suicidal thoughts 0 0 0  PHQ-9 Score 6 5 9    GAD 7 : Generalized Anxiety Score 11/13/2016 08/11/2016 07/07/2016 01/07/2016  Nervous, Anxious, on Edge 2 3 3 3   Control/stop worrying 1 2 3 2   Worry too much - different things 1 2 3 2   Trouble relaxing 1 1 2 1   Restless 0 0 0 0  Easily annoyed or irritable 1 2 3 2   Afraid - awful might happen 1 1 1 1   Total GAD 7 Score 7 11 15 11     Results for orders placed or performed in visit on 06/11/17  POCT glycosylated hemoglobin (Hb A1C)  Result Value Ref Range   Hemoglobin A1C 5.4     Upper outer quadrant of right breast  ASSESSMENT AND PLAN:  1. Breast lump in female -Likely an infected cyst. I am unsure whether this corresponds with the degenerating fibroadenoma seen on  mammogram earlier this year -Give course of Keflex. Needs referral to surgeon to have removed. She will apply for the [facility] card and [facility] discount and follow-up with me in 2 weeks for recheck. At that time we can submit the referral to the surgeon - cephALEXin (KEFLEX) 500 MG capsule; Take 1 capsule (500 mg total) by mouth 4 (four) times daily.  Dispense: 28 capsule; Refill: 0  2.  Essential hypertension At goal. Continue current medication and DASH diet  3. Primary insomnia -Good sleep hygiene discussed. She will continue trazodone as needed  4. Diabetes mellitus screening Not diabetic - POCT glycosylated hemoglobin (Hb A1C)  5. Chronic anxiety -Patient on adequate dose of BuSpar and Prozac. Would benefit from psychiatry referral. Again will need [facility] card or colon discount for [place] to make referral  6. Arthralgia, unspecified joint -I do not think she has rheumatic fever. Tylenol when necessary  7. Need for influenza vaccination - Flu Vaccine QUAD 6+ mos PF IM (Fluarix Quad PF)  Patient was given the opportunity to ask questions.  Patient verbalized understanding of the plan and was able to repeat key elements of the plan.   Orders Placed This Encounter  Procedures  . Flu Vaccine QUAD 6+ mos PF IM (Fluarix Quad PF)  . POCT glycosylated hemoglobin (Hb A1C)     Requested Prescriptions   Signed Prescriptions Disp Refills  . cephALEXin (KEFLEX) 500 MG capsule 28 capsule 0    Sig: Take 1 capsule (500 mg total) by mouth 4 (four) times daily.    Return in about 2 weeks (around 06/25/2017).  Karle Plumber, MD, FACP

## 2017-06-26 ENCOUNTER — Ambulatory Visit: Payer: Self-pay | Admitting: Internal Medicine

## 2017-07-03 ENCOUNTER — Encounter: Payer: Self-pay | Admitting: Internal Medicine

## 2017-07-03 ENCOUNTER — Ambulatory Visit: Payer: Self-pay | Attending: Internal Medicine | Admitting: Internal Medicine

## 2017-07-03 VITALS — BP 99/66 | HR 80 | Temp 98.3°F | Resp 18 | Ht 65.0 in | Wt 168.6 lb

## 2017-07-03 DIAGNOSIS — Z79899 Other long term (current) drug therapy: Secondary | ICD-10-CM | POA: Insufficient documentation

## 2017-07-03 DIAGNOSIS — Z791 Long term (current) use of non-steroidal anti-inflammatories (NSAID): Secondary | ICD-10-CM | POA: Insufficient documentation

## 2017-07-03 DIAGNOSIS — N6081 Other benign mammary dysplasias of right breast: Secondary | ICD-10-CM

## 2017-07-03 DIAGNOSIS — I10 Essential (primary) hypertension: Secondary | ICD-10-CM | POA: Insufficient documentation

## 2017-07-03 DIAGNOSIS — Z885 Allergy status to narcotic agent status: Secondary | ICD-10-CM | POA: Insufficient documentation

## 2017-07-03 DIAGNOSIS — L723 Sebaceous cyst: Secondary | ICD-10-CM | POA: Insufficient documentation

## 2017-07-03 DIAGNOSIS — N6001 Solitary cyst of right breast: Secondary | ICD-10-CM | POA: Insufficient documentation

## 2017-07-03 DIAGNOSIS — R351 Nocturia: Secondary | ICD-10-CM | POA: Insufficient documentation

## 2017-07-03 DIAGNOSIS — G4700 Insomnia, unspecified: Secondary | ICD-10-CM | POA: Insufficient documentation

## 2017-07-03 MED ORDER — SULFAMETHOXAZOLE-TRIMETHOPRIM 400-80 MG PO TABS: 1.0000 | ORAL_TABLET | Freq: Two times a day (BID) | ORAL | 0 refills | Status: DC

## 2017-07-03 MED FILL — SULFAMETHOXAZOLE-TMP SS TAB: 400-80 | 14 days supply | Qty: 14 | Fill #0

## 2017-07-03 NOTE — Progress Notes (Signed)
Patient ID: Kimberly [name], female    DOB: 11-Jun-1956  MRN: [id number]  CC:  F/u breast lump  Subjective:  Kimberly [name] is a 61 y.o. female who presents for 1 wk f/u on infected right breast cyst . Her concerns today include:  1. Patient reports that redness, swelling and pain in the lesion and the right upper quadrant of the breasts has decreased with Keflex antibiotics which she has completed. -No drainage, but it has developed a white head.  -She has a form for [facility] card but has not completed as yet. Needs help in completing it and plans to get one of our workers or her niece to assist with this  Patient Active Problem List   Diagnosis Date Noted  . Diabetes mellitus screening 06/10/2017  . Cold hands and feet 11/13/2016  . Breast nodule 03/02/2016  . Seasonal allergies 01/07/2016  . Right shoulder pain 09/28/2015  . Insomnia 07/06/2015  . Tinea pedis of both feet 07/06/2015  . Nocturia 08/25/2014  . Chronic anxiety 08/25/2014  . HTN (hypertension) 08/16/2014     Current Outpatient Prescriptions on File Prior to Visit  Medication Sig Dispense Refill  . amLODipine (NORVASC) 5 MG tablet TAKE 1 TABLET BY MOUTH DAILY 90 tablet 0  . busPIRone (BUSPAR) 15 MG tablet Take 1 tablet (15 mg total) by mouth 2 (two) times daily. 180 tablet 3  . FLUoxetine (PROZAC) 20 MG tablet Take 1 tablet (20 mg total) by mouth daily. 90 tablet 3  . ibuprofen (ADVIL,MOTRIN) 600 MG tablet TAKE 1 TABLET BY MOUTH EVERY 8 HOURS AS NEEDED. 30 tablet 0  . Melatonin 1 MG CAPS Take by mouth. Reported on 09/28/2015    . Multiple Vitamin (MULTIVITAMIN WITH MINERALS) TABS tablet Take 1 tablet by mouth daily. Reported on 09/28/2015    . traZODone (DESYREL) 50 MG tablet Take 1 tablet (50 mg total) by mouth at bedtime as needed. for sleep 90 tablet 3   No current facility-administered medications on file prior to visit.     Allergies  Allergen Reactions  . Codeine Other (See Comments)    Reaction unknown       ROS: Review of Systems As stated above  PHYSICAL EXAM: BP 99/66 (BP Location: Left Arm, Patient Position: Sitting, Cuff Size: Normal)   Pulse 80   Temp 98.3 F (36.8 C) (Oral)   Resp 18   Ht 5\' 5"  (1.651 m)   Wt 168 lb 9.6 oz (76.5 kg)   SpO2 99%   BMI 28.06 kg/m   Physical Exam General appearance - alert, well appearing, and in no distress Mental status - alert, oriented to person, place, and time, normal mood, behavior, speech, dress, motor activity, and thought processes Breasts - RT breast: dec erythema to superficial lump in upper outer quadrant.  + white head; small amount of white cottage cheese substance produced when the lump was squeezed. Tender to touch  ASSESSMENT AND PLAN: 1. Sebaceous cyst of skin of right breast -Infected cysts. Partial response to Keflex. Will extend antibiotics this time with Bactrim. Patient instructed to apply warm compresses. -We will refer to surgeon for permanent removal. -Patient advised to complete the form for OC ASAP - sulfamethoxazole-trimethoprim (BACTRIM) 400-80 MG tablet; Take 1 tablet by mouth 2 (two) times daily.  Dispense: 14 tablet; Refill: 0 - Ambulatory referral to General Surgery  Patient was given the opportunity to ask questions.  Patient verbalized understanding of the plan and was able to repeat key elements of  the plan.   Orders Placed This Encounter  Procedures  . Ambulatory referral to General Surgery     Requested Prescriptions   Signed Prescriptions Disp Refills  . sulfamethoxazole-trimethoprim (BACTRIM) 400-80 MG tablet 14 tablet 0    Sig: Take 1 tablet by mouth 2 (two) times daily.    No future appointments.  Karle Plumber, MD, FACP

## 2017-07-03 NOTE — Progress Notes (Signed)
Patient is here for f/up   Patient has taking her BP for today

## 2017-07-03 NOTE — Patient Instructions (Signed)
Take the antibiotic as prescribed. Use warm compresses to the right breast.

## 2017-07-09 ENCOUNTER — Other Ambulatory Visit: Payer: Self-pay | Admitting: Family Medicine

## 2017-07-09 DIAGNOSIS — I10 Essential (primary) hypertension: Secondary | ICD-10-CM

## 2017-07-09 MED FILL — AMLODIPINE BESYLATE 5 MG TA: 5 | 30 days supply | Qty: 30 | Fill #0

## 2017-07-09 MED FILL — busPIRone HCL 15 MG TABS: 15 | 30 days supply | Qty: 60 | Fill #3

## 2017-07-09 MED FILL — traZODone HCL 50 MG TABS: 50 | 90 days supply | Qty: 90 | Fill #1

## 2017-08-10 MED FILL — AMLODIPINE BESYLATE 5 MG TA: 5 | 30 days supply | Qty: 30 | Fill #1

## 2017-08-18 MED FILL — FLUoxetine HCL 20 MG CAPS: 20 | 30 days supply | Qty: 30 | Fill #3

## 2017-08-18 MED FILL — busPIRone HCL 15 MG TABS: 15 | 30 days supply | Qty: 60 | Fill #4

## 2017-09-09 MED FILL — AMLODIPINE BESYLATE 5 MG TA: 5 | 30 days supply | Qty: 30 | Fill #2

## 2017-09-28 ENCOUNTER — Other Ambulatory Visit: Payer: Self-pay | Admitting: Internal Medicine

## 2017-09-28 DIAGNOSIS — F419 Anxiety disorder, unspecified: Secondary | ICD-10-CM

## 2017-09-28 MED ORDER — BUSPIRONE HCL 7.5 MG PO TABS: 15.0000 mg | ORAL_TABLET | Freq: Two times a day (BID) | ORAL | 3 refills | Status: DC

## 2017-09-28 MED FILL — busPIRone HCL 7.5 MG TABS: 7.5 | 30 days supply | Qty: 120 | Fill #0

## 2017-09-28 MED FILL — FLUoxetine HCL 20 MG CAPS: 20 | 30 days supply | Qty: 30 | Fill #4

## 2017-10-02 ENCOUNTER — Ambulatory Visit: Payer: Self-pay | Admitting: Internal Medicine

## 2017-10-08 MED FILL — AMLODIPINE BESYLATE 5 MG TA: 5 | 30 days supply | Qty: 30 | Fill #0

## 2017-10-08 MED FILL — traZODone HCL 50 MG TABS: 50 | 90 days supply | Qty: 90 | Fill #2

## 2017-10-15 ENCOUNTER — Encounter: Payer: Self-pay | Admitting: Internal Medicine

## 2017-10-15 ENCOUNTER — Ambulatory Visit: Payer: Self-pay | Attending: Internal Medicine | Admitting: Internal Medicine

## 2017-10-15 VITALS — BP 119/77 | HR 82 | Temp 98.8°F | Resp 16 | Wt 176.4 lb

## 2017-10-15 DIAGNOSIS — I10 Essential (primary) hypertension: Secondary | ICD-10-CM

## 2017-10-15 DIAGNOSIS — Z79899 Other long term (current) drug therapy: Secondary | ICD-10-CM | POA: Insufficient documentation

## 2017-10-15 DIAGNOSIS — G252 Other specified forms of tremor: Secondary | ICD-10-CM

## 2017-10-15 DIAGNOSIS — W19XXXD Unspecified fall, subsequent encounter: Secondary | ICD-10-CM | POA: Insufficient documentation

## 2017-10-15 DIAGNOSIS — F419 Anxiety disorder, unspecified: Secondary | ICD-10-CM | POA: Insufficient documentation

## 2017-10-15 DIAGNOSIS — G4700 Insomnia, unspecified: Secondary | ICD-10-CM | POA: Insufficient documentation

## 2017-10-15 DIAGNOSIS — S8012XA Contusion of left lower leg, initial encounter: Secondary | ICD-10-CM

## 2017-10-15 DIAGNOSIS — Z6829 Body mass index (BMI) 29.0-29.9, adult: Secondary | ICD-10-CM | POA: Insufficient documentation

## 2017-10-15 DIAGNOSIS — Z803 Family history of malignant neoplasm of breast: Secondary | ICD-10-CM | POA: Insufficient documentation

## 2017-10-15 DIAGNOSIS — Z9049 Acquired absence of other specified parts of digestive tract: Secondary | ICD-10-CM | POA: Insufficient documentation

## 2017-10-15 DIAGNOSIS — Z885 Allergy status to narcotic agent status: Secondary | ICD-10-CM | POA: Insufficient documentation

## 2017-10-15 DIAGNOSIS — S8012XD Contusion of left lower leg, subsequent encounter: Secondary | ICD-10-CM | POA: Insufficient documentation

## 2017-10-15 DIAGNOSIS — E663 Overweight: Secondary | ICD-10-CM

## 2017-10-15 MED ORDER — ATENOLOL 25 MG PO TABS: 25.0000 mg | ORAL_TABLET | Freq: Every day | ORAL | 3 refills | Status: DC

## 2017-10-15 MED FILL — ?ATENOLOL 25MG TABLET: 25 | 30 days supply | Qty: 30 | Fill #0

## 2017-10-15 NOTE — Patient Instructions (Addendum)
Please give patient an appointment with Ms. Luciana Axe. She has form for [employer] which she as completed.   Stop Amlodipine. Start Atenolol.  Start walking again for exercise.

## 2017-10-15 NOTE — Progress Notes (Signed)
Patient ID: Kimberly [name], female    DOB: 27-Apr-1956  MRN: [id number]  CC: Hypertension   Subjective: Kimberly [name] is a 62 y.o. female who presents for chronic ds management. Her concerns today include:  Pt with hx of anxiety, HTN and insomnia.  1.  HTN: compliant with Norvasc -limits salt in foods No CP/SOB/LE edema -use to walk her dogs when weather was good.  Has not walked in a while Gained 8 lbs since 06/2017.  "Sweets is my problem - cake, candy."  2. "My nerves are bad." Notice head bobbing and having to hold LT hand to steady right hand when eating. Started 1-2 yrs ago and getting worse -no fhx of tremors or Parkinson  3.  Bruised LLE few mths ago. [place] off a treadmill at the [facility] few mths ago. Took a while to resolve but swelling has significantly decreased  4. Has completed for OC but needs appt with Ms. Tornado on RT breast has resolved  Patient Active Problem List   Diagnosis Date Noted  . Diabetes mellitus screening 06/10/2017  . Cold hands and feet 11/13/2016  . Breast nodule 03/02/2016  . Seasonal allergies 01/07/2016  . Right shoulder pain 09/28/2015  . Insomnia 07/06/2015  . Tinea pedis of both feet 07/06/2015  . Nocturia 08/25/2014  . Chronic anxiety 08/25/2014  . HTN (hypertension) 08/16/2014     Current Outpatient Medications on File Prior to Visit  Medication Sig Dispense Refill  . busPIRone (BUSPAR) 7.5 MG tablet Take 2 tablets (15 mg total) by mouth 2 (two) times daily. 120 tablet 3  . FLUoxetine (PROZAC) 20 MG tablet Take 1 tablet (20 mg total) by mouth daily. 90 tablet 3  . ibuprofen (ADVIL,MOTRIN) 600 MG tablet TAKE 1 TABLET BY MOUTH EVERY 8 HOURS AS NEEDED. 30 tablet 0  . Melatonin 1 MG CAPS Take by mouth. Reported on 09/28/2015    . Multiple Vitamin (MULTIVITAMIN WITH MINERALS) TABS tablet Take 1 tablet by mouth daily. Reported on 09/28/2015    . sulfamethoxazole-trimethoprim (BACTRIM) 400-80 MG tablet Take 1 tablet by mouth 2 (two)  times daily. 14 tablet 0  . traZODone (DESYREL) 50 MG tablet Take 1 tablet (50 mg total) by mouth at bedtime as needed. for sleep 90 tablet 3   No current facility-administered medications on file prior to visit.     Allergies  Allergen Reactions  . Codeine Other (See Comments)    Reaction unknown    Social History   Socioeconomic History  . Marital status: Single    Spouse name: Not on file  . Number of children: Not on file  . Years of education: Not on file  . Highest education level: Not on file  Social Needs  . Financial resource strain: Not on file  . Food insecurity - worry: Not on file  . Food insecurity - inability: Not on file  . Transportation needs - medical: Not on file  . Transportation needs - non-medical: Not on file  Occupational History  . Not on file  Tobacco Use  . Smoking status: Never Smoker  . Smokeless tobacco: Never Used  Substance and Sexual Activity  . Alcohol use: No    Alcohol/week: 0.0 oz  . Drug use: No  . Sexual activity: Not on file  Other Topics Concern  . Not on file  Social History Narrative  . Not on file    Family History  Problem Relation Age of Onset  . Breast cancer Mother   .  Cancer Maternal Grandmother   . Heart disease Paternal Grandfather     Past Surgical History:  Procedure Laterality Date  . APPENDECTOMY    . CHOLECYSTECTOMY    . TONSILLECTOMY      ROS: Review of Systems As above PHYSICAL EXAM: BP 119/77   Pulse 82   Temp 98.8 F (37.1 C) (Oral)   Resp 16   Wt 176 lb 6.4 oz (80 kg)   SpO2 97%   BMI 29.35 kg/m   Wt Readings from Last 3 Encounters:  10/15/17 176 lb 6.4 oz (80 kg)  07/03/17 168 lb 9.6 oz (76.5 kg)  06/11/17 170 lb 6.4 oz (77.3 kg)    Physical Exam General appearance - alert, well appearing, and in no distress Mental status - alert, oriented to person, place, and time, normal mood, behavior, speech, dress, motor activity, and thought processes Neck - supple, no significant  adenopathy Chest - clear to auscultation, no wheezes, rales or rhonchi, symmetric air entry Heart - normal rate, regular rhythm, normal S1, S2, no murmurs, rubs, clicks or gallops Neurological - fine mild tremor both hands on outstretch arms. + fine intention tremor worse on RT. Fine head tremor Extremities - no Le edema MSK: resolving small area ecchymosis LT lower shin  ASSESSMENT AND PLAN: 1. Essential hypertension 2. Intention tremor Trail of B-blocker. Will have her d/c Amlodipine and start Atenolol instead. F/U in 1 mth to see how she is doing - atenolol (TENORMIN) 25 MG tablet; Take 1 tablet (25 mg total) by mouth daily.  Dispense: 90 tablet; Refill: 3   3. Contusion of left lower extremity, initial encounter Resolving.  4. Over weight Encourage her to get back in gym Pt advise if she uses treadmill, set the rate very low and go slow to avoid any further falls.   I have aske front desk to schedule appt for her to see Ms. Luciana Axe. Pt to bring in completed OC form on that visit.  Patient was given the opportunity to ask questions.  Patient verbalized understanding of the plan and was able to repeat key elements of the plan.   No orders of the defined types were placed in this encounter.    Requested Prescriptions   Signed Prescriptions Disp Refills  . atenolol (TENORMIN) 25 MG tablet 90 tablet 3    Sig: Take 1 tablet (25 mg total) by mouth daily.    Return in about 1 month (around 11/15/2017).  Karle Plumber, MD, FACP

## 2017-11-03 MED FILL — FLUoxetine HCL 20 MG CAPS: 20 | 30 days supply | Qty: 30 | Fill #5

## 2017-11-03 MED FILL — busPIRone HCL 7.5 MG TABS: 7.5 | 30 days supply | Qty: 120 | Fill #1

## 2017-11-04 ENCOUNTER — Ambulatory Visit: Payer: Self-pay | Attending: Internal Medicine

## 2017-11-11 MED FILL — ?ATENOLOL 25MG TABLET: 25 | 30 days supply | Qty: 30 | Fill #1

## 2017-12-03 ENCOUNTER — Encounter: Payer: Self-pay | Admitting: Internal Medicine

## 2017-12-03 ENCOUNTER — Ambulatory Visit: Payer: Self-pay | Attending: Internal Medicine | Admitting: Internal Medicine

## 2017-12-03 VITALS — BP 118/74 | HR 73 | Temp 98.1°F | Resp 16 | Ht 64.0 in | Wt 172.8 lb

## 2017-12-03 DIAGNOSIS — G4700 Insomnia, unspecified: Secondary | ICD-10-CM | POA: Insufficient documentation

## 2017-12-03 DIAGNOSIS — I10 Essential (primary) hypertension: Secondary | ICD-10-CM

## 2017-12-03 DIAGNOSIS — G252 Other specified forms of tremor: Secondary | ICD-10-CM

## 2017-12-03 DIAGNOSIS — Z79899 Other long term (current) drug therapy: Secondary | ICD-10-CM | POA: Insufficient documentation

## 2017-12-03 DIAGNOSIS — F419 Anxiety disorder, unspecified: Secondary | ICD-10-CM | POA: Insufficient documentation

## 2017-12-03 MED FILL — busPIRone HCL 7.5 MG TABS: 7.5 | 30 days supply | Qty: 120 | Fill #2

## 2017-12-03 NOTE — Progress Notes (Signed)
Patient ID: Kimberly [name], female    DOB: Mar 10, 1956  MRN: [id number]  CC: Hypertension   Subjective: Kimberly [name] is a 62 y.o. female who presents for 1 mth f/u HTN and tremor Her concerns today include:  Pt with hx of anxiety, HTN and insomnia.  1.  On last visit, we change Norvasc to Atenolol to see if it dec tremors in hands and "bobbing of the head."  She feels it has helped some with this.  She did meet with Ms. Luciana Axe and turned in [place].  Has not been notified of whether she has been approved.  HM: Reportedly has FIT test at [facility] given [facility]  She has not use it as yet.  She wants to do that instead of c-scope.  Patient Active Problem List   Diagnosis Date Noted  . Intention tremor 10/15/2017  . Diabetes mellitus screening 06/10/2017  . Cold hands and feet 11/13/2016  . Breast nodule 03/02/2016  . Seasonal allergies 01/07/2016  . Right shoulder pain 09/28/2015  . Insomnia 07/06/2015  . Tinea pedis of both feet 07/06/2015  . Nocturia 08/25/2014  . Chronic anxiety 08/25/2014  . HTN (hypertension) 08/16/2014     Current Outpatient Medications on File Prior to Visit  Medication Sig Dispense Refill  . atenolol (TENORMIN) 25 MG tablet Take 1 tablet (25 mg total) by mouth daily. 90 tablet 3  . busPIRone (BUSPAR) 7.5 MG tablet Take 2 tablets (15 mg total) by mouth 2 (two) times daily. 120 tablet 3  . FLUoxetine (PROZAC) 20 MG tablet Take 1 tablet (20 mg total) by mouth daily. 90 tablet 3  . ibuprofen (ADVIL,MOTRIN) 600 MG tablet TAKE 1 TABLET BY MOUTH EVERY 8 HOURS AS NEEDED. 30 tablet 0  . Melatonin 1 MG CAPS Take by mouth. Reported on 09/28/2015    . Multiple Vitamin (MULTIVITAMIN WITH MINERALS) TABS tablet Take 1 tablet by mouth daily. Reported on 09/28/2015    . sulfamethoxazole-trimethoprim (BACTRIM) 400-80 MG tablet Take 1 tablet by mouth 2 (two) times daily. 14 tablet 0  . traZODone (DESYREL) 50 MG tablet Take 1 tablet (50 mg total) by mouth at bedtime as  needed. for sleep 90 tablet 3   No current facility-administered medications on file prior to visit.     Allergies  Allergen Reactions  . Codeine Other (See Comments)    Reaction unknown    Social History   Socioeconomic History  . Marital status: Single    Spouse name: Not on file  . Number of children: Not on file  . Years of education: Not on file  . Highest education level: Not on file  Social Needs  . Financial resource strain: Not on file  . Food insecurity - worry: Not on file  . Food insecurity - inability: Not on file  . Transportation needs - medical: Not on file  . Transportation needs - non-medical: Not on file  Occupational History  . Not on file  Tobacco Use  . Smoking status: Never Smoker  . Smokeless tobacco: Never Used  Substance and Sexual Activity  . Alcohol use: No    Alcohol/week: 0.0 oz  . Drug use: No  . Sexual activity: Not on file  Other Topics Concern  . Not on file  Social History Narrative  . Not on file    Family History  Problem Relation Age of Onset  . Breast cancer Mother   . Cancer Maternal Grandmother   . Heart disease Paternal Grandfather  Past Surgical History:  Procedure Laterality Date  . APPENDECTOMY    . CHOLECYSTECTOMY    . TONSILLECTOMY      ROS: Review of Systems Negative except as stated above PHYSICAL EXAM: BP 118/74   Pulse 73   Temp 98.1 F (36.7 C) (Oral)   Resp 16   Ht 5\' 4"  (1.626 m)   Wt 172 lb 12.8 oz (78.4 kg)   SpO2 98%   BMI 29.66 kg/m   Physical Exam  General appearance - alert, well appearing, and in no distress Mental status - alert, oriented to person, place, and time, normal mood, behavior, speech, dress, motor activity, and thought processes Neurological - still with fine tremor in RT hand with outstretch arms and with intention pointing   ASSESSMENT AND PLAN: 1. Essential hypertension At goal  2. Intention tremor -no room for increase on Atenolol given BP today.  Continue  current dose - Ambulatory referral to Neurology  Patient was given the opportunity to ask questions.  Patient verbalized understanding of the plan and was able to repeat key elements of the plan.   Orders Placed This Encounter  Procedures  . Ambulatory referral to Neurology     Requested Prescriptions    No prescriptions requested or ordered in this encounter    Return in about 3 months (around 03/02/2018).  Karle Plumber, MD, FACP

## 2017-12-03 NOTE — Patient Instructions (Signed)
I will refer you to a neurologist if you were approved for the [facility].

## 2017-12-08 ENCOUNTER — Other Ambulatory Visit: Payer: Self-pay | Admitting: Pharmacist

## 2017-12-08 DIAGNOSIS — F419 Anxiety disorder, unspecified: Secondary | ICD-10-CM

## 2017-12-08 MED ORDER — FLUOXETINE HCL 20 MG PO TABS: 20.0000 mg | ORAL_TABLET | Freq: Every day | ORAL | 0 refills | Status: DC

## 2017-12-08 MED FILL — ?FLUOXETINE HCL 20MG TABLET: 20 | 30 days supply | Qty: 30 | Fill #0

## 2017-12-11 MED FILL — ATENOLOL 25 MG TABLET: 25 | 30 days supply | Qty: 30 | Fill #2

## 2018-01-07 ENCOUNTER — Other Ambulatory Visit: Payer: Self-pay | Admitting: Pharmacist

## 2018-01-07 DIAGNOSIS — F5104 Psychophysiologic insomnia: Secondary | ICD-10-CM

## 2018-01-07 MED ORDER — TRAZODONE HCL 50 MG PO TABS: 50.0000 mg | ORAL_TABLET | Freq: Every evening | ORAL | 0 refills | Status: DC | PRN

## 2018-01-07 MED FILL — busPIRone HCL 7.5 MG TABS: 7.5 | 30 days supply | Qty: 120 | Fill #3

## 2018-01-07 MED FILL — ?FLUOXETINE HCL 20MG TABLET: 20 | 30 days supply | Qty: 30 | Fill #1

## 2018-01-07 MED FILL — traZODone HCL 50 MG TABS: 50 | 30 days supply | Qty: 30 | Fill #0

## 2018-01-07 MED FILL — ?ATENOLOL 25MG TABLET: 25 | 30 days supply | Qty: 30 | Fill #3

## 2018-01-13 ENCOUNTER — Ambulatory Visit: Payer: Self-pay | Admitting: Neurology

## 2018-01-15 ENCOUNTER — Encounter: Payer: Self-pay | Admitting: Neurology

## 2018-02-01 ENCOUNTER — Other Ambulatory Visit: Payer: Self-pay | Admitting: Pediatrics

## 2018-02-10 ENCOUNTER — Other Ambulatory Visit: Payer: Self-pay | Admitting: Internal Medicine

## 2018-02-10 DIAGNOSIS — F419 Anxiety disorder, unspecified: Secondary | ICD-10-CM

## 2018-02-10 MED FILL — ?ATENOLOL 25MG TABLET: 25 | 30 days supply | Qty: 30 | Fill #4

## 2018-02-10 MED FILL — ?FLUOXETINE HCL 20MG TAB: 20 | 30 days supply | Qty: 30 | Fill #2

## 2018-02-10 MED FILL — traZODone HCL 50 MG TABS: 50 | 30 days supply | Qty: 30 | Fill #1

## 2018-02-11 MED FILL — busPIRone HCL 7.5 MG TABS: 7.5 | 30 days supply | Qty: 120 | Fill #0

## 2018-02-23 ENCOUNTER — Ambulatory Visit (INDEPENDENT_AMBULATORY_CARE_PROVIDER_SITE_OTHER): Payer: Self-pay | Admitting: Neurology

## 2018-02-23 ENCOUNTER — Encounter: Payer: Self-pay | Admitting: Neurology

## 2018-02-23 ENCOUNTER — Telehealth: Payer: Self-pay | Admitting: Neurology

## 2018-02-23 VITALS — BP 168/91 | HR 68 | Ht 65.0 in | Wt 173.0 lb

## 2018-02-23 DIAGNOSIS — R251 Tremor, unspecified: Secondary | ICD-10-CM

## 2018-02-23 NOTE — Telephone Encounter (Signed)
[facility] [employer] assistance account # [id number] (exp. 05/10/18) order sent to [facility]. They will reach out to the pt to schedule.

## 2018-02-23 NOTE — Progress Notes (Signed)
Subjective:    Patient ID: Kimberly [name] is a 62 y.o. female.  HPI     Star Age, MD, PhD [facility] [address] [place], [place] [address]  Dear Dr. Wynetta Emery,  I saw your patient, [name], upon your kind request in my neurologic clinic today for initial consultation of her tremors. The patient is unaccompanied today. As you know, [name] is a 62 year old right-handed woman with an underlying medical history of anxiety, hypertension, seasonal allergies, insomnia, and overweight state, who reports a bilateral hand tremor for the past 1-2 years. I reviewed your office note from 12/03/2017. She has been on atenolol, 25 mg once daily, she has not noticed much in the way of tremor benefit from it. She admits that she does not drink enough water, sometimes only 1 or 2 cups per day, does not drink soda daily, and does not like caffeine in the form of tea or coffee. Sometimes she drinks some juice.  She is single and lives with her daughter, she has 1 grown daughter. She is a nonsmoker and does not utilize alcohol. She had routine blood work last year in February which I reviewed, TSH was normal at the time. She has a follow-up appointment next week. She is not familiar with her father side of her family history. She has 1 brother and 1 sister, neither one with tremors. Her mother did not have a tremor, she died at 2 from cancer. 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   Her Past Medical History Is Significant For: Past Medical History:  Diagnosis Date  . Anxiety 1995  . Depression 1995  . Hypertension Dx Dec 2015  . Panic attack 1995    Her Past Surgical History Is Significant For: Past Surgical History:  Procedure Laterality Date  . APPENDECTOMY    . CHOLECYSTECTOMY    . TONSILLECTOMY      Her Family History Is Significant For: Family History  Problem Relation Age of Onset  . Breast cancer Mother   . Cancer Maternal Grandmother    . Heart disease Paternal Grandfather     Her Social History Is Significant For: Social History   Socioeconomic History  . Marital status: Single    Spouse name: Not on file  . Number of children: Not on file  . Years of education: Not on file  . Highest education level: Not on file  Occupational History  . Not on file  Social Needs  . Financial resource strain: Not on file  . Food insecurity:    Worry: Not on file    Inability: Not on file  . Transportation needs:    Medical: Not on file    Non-medical: Not on file  Tobacco Use  . Smoking status: Never Smoker  . Smokeless tobacco: Never Used  Substance and Sexual Activity  . Alcohol use: No    Alcohol/week: 0.0 oz  . Drug use: No  . Sexual activity: Not on file  Lifestyle  . Physical activity:    Days per week: Not on file    Minutes per session: Not on file  . Stress: Not on file  Relationships  . Social connections:    Talks on phone: Not on file    Gets together: Not on file    Attends religious service: Not on file    Active member of club or organization: Not on file    Attends meetings of clubs or organizations: Not on file  Relationship status: Not on file  Other Topics Concern  . Not on file  Social History Narrative  . Not on file    Her Allergies Are:  Allergies  Allergen Reactions  . Codeine Other (See Comments)    Reaction unknown  :   Her Current Medications Are:  Outpatient Encounter Medications as of 02/23/2018  Medication Sig  . atenolol (TENORMIN) 25 MG tablet Take 1 tablet (25 mg total) by mouth daily.  . busPIRone (BUSPAR) 7.5 MG tablet TAKE 2 TABLETS (15 MG TOTAL) BY MOUTH 2 (TWO) TIMES DAILY.  Marland Kitchen FLUoxetine (PROZAC) 20 MG tablet Take 1 tablet (20 mg total) by mouth daily.  Marland Kitchen ibuprofen (ADVIL,MOTRIN) 600 MG tablet TAKE 1 TABLET BY MOUTH EVERY 8 HOURS AS NEEDED.  . Multiple Vitamin (MULTIVITAMIN WITH MINERALS) TABS tablet Take 1 tablet by mouth daily. Reported on 09/28/2015  .  traZODone (DESYREL) 50 MG tablet Take 1 tablet (50 mg total) by mouth at bedtime as needed. for sleep  . [DISCONTINUED] Melatonin 1 MG CAPS Take by mouth. Reported on 09/28/2015  . [DISCONTINUED] sulfamethoxazole-trimethoprim (BACTRIM) 400-80 MG tablet Take 1 tablet by mouth 2 (two) times daily.   No facility-administered encounter medications on file as of 02/23/2018.   : Review of Systems:  Out of a complete 14 point review of systems, all are reviewed and negative with the exception of these symptoms as listed below: Review of Systems  Neurological:       Pt presents today to discuss her tremors. Pt has tremors primarily in her right hand and in her head/neck. Pt is right handed.    Objective:  Neurological Exam  Physical Exam Physical Examination:   Vitals:   02/23/18 1414  BP: (!) 168/91  Pulse: 68    General Examination: The patient is a very pleasant 62 y.o. female in no acute distress. She appears well-developed and well-nourished and well groomed.   HEENT: Normocephalic, atraumatic, pupils are equal, round and reactive to light and accommodation. Corrective eyeglasses in place. Hearing is grossly intact. Extraocular tracking is fairly good. Face is symmetric, no facial masking is noted, no voice tremor. She has intermittent stutter/stammer. She has an intermittent head bobbing, it is variable. Oropharynx exam reveals: moderate mouth dryness, adequate dental hygiene. Tongue protrudes centrally and palate elevates symmetrically.   Chest: Clear to auscultation without wheezing, rhonchi or crackles noted.  Heart: S1+S2+0, regular and normal without murmurs, rubs or gallops noted.   Abdomen: Soft, non-tender and non-distended with normal bowel sounds appreciated on auscultation.  Extremities: There is no pitting edema in the distal lower extremities bilaterally. Pedal pulses are intact.  Skin: Warm and dry without trophic changes noted.  Musculoskeletal: exam reveals no  obvious joint deformities, tenderness or joint swelling or erythema.   Neurologically:  Mental status: The patient is awake, alert and oriented in all 4 spheres. Her immediate and remote memory, attention, language skills and fund of knowledge are appropriate. There is no evidence of aphasia, agnosia, apraxia or anomia. Speech is clear with normal prosody and enunciation. Thought process is linear. Mood is normal and affect is normal.  Cranial nerves II - XII are as described above under HEENT exam. In addition: shoulder shrug is normal with equal shoulder height noted. Motor exam: Normal bulk, strength and tone is noted. There is no drift, tremor or rebound.   On 02/23/2018: on Archimedes spiral drawing she has mild trembling with both left and right hand. Handwriting is minimally tremulous, legible, not micrographic.  She has a very minute postural tremor, no action tremor, no resting or intention tremor.  Romberg is negative. Reflexes are 2+ throughout. Fine motor skills and coordination: intact with normal finger taps, normal hand movements, normal rapid alternating patting, normal foot taps and normal foot agility. Pill insecurity noted with finger taps and foot taps on the right but variable.   Cerebellar testing: No dysmetria or intention tremor on finger to nose testing. Heel to shin is unremarkable bilaterally. There is no truncal or gait ataxia.  Sensory exam: intact to light touch in the upper and lower extremities.  Gait, station and balance: She stands easily. No veering to one side is noted. No leaning to one side is noted. Posture is age-appropriate and mildly stooped in the upper back. Tandem walk is challenging for her.          Assessment and Plan:   In summary, Lynnda Wiersma is a very pleasant 62 y.o.-year old female with an underlying medical history of anxiety, hypertension, seasonal allergies, insomnia, and overweight state, who presents with neurologic consultation of her  tremors. She reports a 1-2 year history of hand tremors mild right more than left. On examination she has a very mild hand tremor, some variable head-bobbing noted. She does not give a very telltale history for essential tremor. She has been on Prozac generic for some time, probably years. She has not noticed a correlation between the tremor and being on Prozac but is not fully sure. She does not utilize caffeine on a day-to-day basis. Of note, she may not be hydrating well enough. She is encouraged to be better hydrated with water. I do not see any signs of parkinsonism which is reassuring. She has been on atenolol which according to your office note helped a little bit but she reported it did not help her tremor to me. Nevertheless, she is encouraged to continue with it. She is advised that I would not recommend any new medication for her tremor as it is so mild. We will continue to follow clinically. I would like to proceed with a brain MRI to rule out a structural cause of her tremors. She is agreeable. We will call her with her MRI results, I suggested that she follow-up with one of our nurse practitioners in 6 months, sooner if needed. We will call her in the interim with the MRI results. She is probably due for her yearly blood work and has an appointment for follow-up next week with you, which she is planning to keep.  I answered all her questions today and she was in agreement.   Thank you very much for allowing me to participate in the care of this nice patient. If I can be of any further assistance to you please do not hesitate to call me at [phone].  Sincerely,   Star Age, MD, PhD

## 2018-02-23 NOTE — Patient Instructions (Addendum)
We will monitor your tremor, which is mild. I would not recommend any additional medication at this time, we will monitor your symptoms.  I do not see any signs of parkinsonism, which is reassuring.   We will do a brain scan, called MRI and call you with the test results. We will have to schedule you for this on a separate date. This test requires authorization from your insurance, and we will take care of the insurance process.   You may be due for your yearly blood work. Please address this at your next appointment coming up next week.  Please hydrate better with water. You may not get enough water on a day to day basis.

## 2018-03-02 ENCOUNTER — Encounter: Payer: Self-pay | Admitting: Internal Medicine

## 2018-03-02 ENCOUNTER — Ambulatory Visit: Payer: Self-pay | Attending: Internal Medicine | Admitting: Internal Medicine

## 2018-03-02 VITALS — BP 123/78 | HR 66 | Temp 97.9°F | Resp 16 | Wt 171.6 lb

## 2018-03-02 DIAGNOSIS — Z1211 Encounter for screening for malignant neoplasm of colon: Secondary | ICD-10-CM

## 2018-03-02 DIAGNOSIS — I10 Essential (primary) hypertension: Secondary | ICD-10-CM | POA: Insufficient documentation

## 2018-03-02 DIAGNOSIS — G4700 Insomnia, unspecified: Secondary | ICD-10-CM | POA: Insufficient documentation

## 2018-03-02 DIAGNOSIS — Z79899 Other long term (current) drug therapy: Secondary | ICD-10-CM | POA: Insufficient documentation

## 2018-03-02 DIAGNOSIS — F419 Anxiety disorder, unspecified: Secondary | ICD-10-CM | POA: Insufficient documentation

## 2018-03-02 DIAGNOSIS — G252 Other specified forms of tremor: Secondary | ICD-10-CM | POA: Insufficient documentation

## 2018-03-02 DIAGNOSIS — Z0189 Encounter for other specified special examinations: Secondary | ICD-10-CM | POA: Insufficient documentation

## 2018-03-02 DIAGNOSIS — Z885 Allergy status to narcotic agent status: Secondary | ICD-10-CM | POA: Insufficient documentation

## 2018-03-02 NOTE — Progress Notes (Signed)
Patient ID: Kimberly [name], female    DOB: 1956/01/24  MRN: [id number]  CC: Hypertension   Subjective: Kimberly [name] is a 62 y.o. female who presents for chronic ds management Her concerns today include:  Pt with hx of anxiety, HTN and insomnia.  Tremors:  Saw Dr. Rexene Alberts from [facility].  Recommond MRI of head which has been ordered  HTN:  Compliant with Atenolol and salt restriction She walks her dogs in the evenings and mornings and goes to the [employer] a few times a month with her daughter  Anxiety:  Doing okay on Buspar and Prozac except "when those children get on my nerve."  She gets her grandchildren at time  HM:  Due for colon CA screening Patient Active Problem List   Diagnosis Date Noted  . Intention tremor 10/15/2017  . Diabetes mellitus screening 06/10/2017  . Cold hands and feet 11/13/2016  . Breast nodule 03/02/2016  . Seasonal allergies 01/07/2016  . Right shoulder pain 09/28/2015  . Insomnia 07/06/2015  . Tinea pedis of both feet 07/06/2015  . Nocturia 08/25/2014  . Chronic anxiety 08/25/2014  . HTN (hypertension) 08/16/2014     Current Outpatient Medications on File Prior to Visit  Medication Sig Dispense Refill  . atenolol (TENORMIN) 25 MG tablet Take 1 tablet (25 mg total) by mouth daily. 90 tablet 3  . busPIRone (BUSPAR) 7.5 MG tablet TAKE 2 TABLETS (15 MG TOTAL) BY MOUTH 2 (TWO) TIMES DAILY. 120 tablet 3  . FLUoxetine (PROZAC) 20 MG tablet Take 1 tablet (20 mg total) by mouth daily. 90 tablet 0  . ibuprofen (ADVIL,MOTRIN) 600 MG tablet TAKE 1 TABLET BY MOUTH EVERY 8 HOURS AS NEEDED. 30 tablet 0  . Multiple Vitamin (MULTIVITAMIN WITH MINERALS) TABS tablet Take 1 tablet by mouth daily. Reported on 09/28/2015    . traZODone (DESYREL) 50 MG tablet Take 1 tablet (50 mg total) by mouth at bedtime as needed. for sleep 90 tablet 0   No current facility-administered medications on file prior to visit.     Allergies  Allergen Reactions  . Codeine  Other (See Comments)    Reaction unknown    Social History   Socioeconomic History  . Marital status: Single    Spouse name: Not on file  . Number of children: Not on file  . Years of education: Not on file  . Highest education level: Not on file  Occupational History  . Not on file  Social Needs  . Financial resource strain: Not on file  . Food insecurity:    Worry: Not on file    Inability: Not on file  . Transportation needs:    Medical: Not on file    Non-medical: Not on file  Tobacco Use  . Smoking status: Never Smoker  . Smokeless tobacco: Never Used  Substance and Sexual Activity  . Alcohol use: No    Alcohol/week: 0.0 oz  . Drug use: No  . Sexual activity: Not on file  Lifestyle  . Physical activity:    Days per week: Not on file    Minutes per session: Not on file  . Stress: Not on file  Relationships  . Social connections:    Talks on phone: Not on file    Gets together: Not on file    Attends religious service: Not on file    Active member of club or organization: Not on file    Attends meetings of clubs or organizations: Not on file  Relationship status: Not on file  . Intimate partner violence:    Fear of current or ex partner: Not on file    Emotionally abused: Not on file    Physically abused: Not on file    Forced sexual activity: Not on file  Other Topics Concern  . Not on file  Social History Narrative  . Not on file    Family History  Problem Relation Age of Onset  . Breast cancer Mother   . Cancer Maternal Grandmother   . Heart disease Paternal Grandfather     Past Surgical History:  Procedure Laterality Date  . APPENDECTOMY    . CHOLECYSTECTOMY    . TONSILLECTOMY      ROS: Review of Systems Neg except as above  PHYSICAL EXAM: BP 123/78   Pulse 66   Temp 97.9 F (36.6 C) (Oral)   Resp 16   Wt 171 lb 9.6 oz (77.8 kg)   SpO2 98%   BMI 28.56 kg/m   Physical Exam  General appearance - alert, well appearing, and in  no distress Mental status - normal mood, behavior, speech, dress, motor activity, and thought processes Neck - supple, no significant adenopathy Chest - clear to auscultation, no wheezes, rales or rhonchi, symmetric air entry Heart - normal rate, regular rhythm, normal S1, S2, no murmurs, rubs, clicks or gallops Extremities - peripheral pulses normal, no pedal edema, no clubbing or cyanosis  ASSESSMENT AND PLAN: 1. Essential hypertension At goal continue Atenolol - CBC - Lipid panel - Comprehensive metabolic panel  2. Chronic anxiety -continue Buspar and Prozac  3. Intention tremor - TSH Being worked up by neurology  4. Colon cancer screening Pt agreeable to doing cologuard test - Fecal occult blood, imunochemical([facility])   Patient was given the opportunity to ask questions.  Patient verbalized understanding of the plan and was able to repeat key elements of the plan.   Orders Placed This Encounter  Procedures  . Fecal occult blood, imunochemical([facility])  . CBC  . Lipid panel  . Comprehensive metabolic panel  . TSH     Requested Prescriptions    No prescriptions requested or ordered in this encounter    Return in about 3 months (around 06/02/2018).  Karle Plumber, MD, FACP

## 2018-03-03 LAB — COMPREHENSIVE METABOLIC PANEL
ALT: 14 IU/L (ref 0–32)
AST: 22 IU/L (ref 0–40)
Albumin/Globulin Ratio: 1.6 (ref 1.2–2.2)
Albumin: 4.9 g/dL — ABNORMAL HIGH (ref 3.6–4.8)
Alkaline Phosphatase: 89 IU/L (ref 39–117)
BILIRUBIN TOTAL: 0.3 mg/dL (ref 0.0–1.2)
BUN/Creatinine Ratio: 16 (ref 12–28)
BUN: 15 mg/dL (ref 8–27)
CHLORIDE: 101 mmol/L (ref 96–106)
CO2: 19 mmol/L — AB (ref 20–29)
Calcium: 10.1 mg/dL (ref 8.7–10.3)
Creatinine, Ser: 0.94 mg/dL (ref 0.57–1.00)
GFR, EST AFRICAN AMERICAN: 75 mL/min/{1.73_m2} (ref >59)
GFR, EST NON AFRICAN AMERICAN: 65 mL/min/{1.73_m2} (ref >59)
GLUCOSE: 87 mg/dL (ref 65–99)
Globulin, Total: 3 g/dL (ref 1.5–4.5)
Potassium: 4.6 mmol/L (ref 3.5–5.2)
Sodium: 139 mmol/L (ref 134–144)
TOTAL PROTEIN: 7.9 g/dL (ref 6.0–8.5)

## 2018-03-03 LAB — CBC
Hematocrit: 44.7 % (ref 34.0–46.6)
Hemoglobin: 15.1 g/dL (ref 11.1–15.9)
MCH: 30.5 pg (ref 26.6–33.0)
MCHC: 33.8 g/dL (ref 31.5–35.7)
MCV: 90 fL (ref 79–97)
Platelets: 418 10*3/uL (ref 150–450)
RBC: 4.95 x10E6/uL (ref 3.77–5.28)
RDW: 14.1 % (ref 12.3–15.4)
WBC: 6.3 10*3/uL (ref 3.4–10.8)

## 2018-03-03 LAB — TSH: TSH: 1.81 u[IU]/mL (ref 0.450–4.500)

## 2018-03-03 LAB — LIPID PANEL
CHOL/HDL RATIO: 3 ratio (ref 0.0–4.4)
Cholesterol, Total: 192 mg/dL (ref 100–199)
HDL: 64 mg/dL (ref >39)
LDL Calculated: 105 mg/dL — ABNORMAL HIGH (ref 0–99)
Triglycerides: 114 mg/dL (ref 0–149)
VLDL Cholesterol Cal: 23 mg/dL (ref 5–40)

## 2018-03-04 ENCOUNTER — Telehealth: Payer: Self-pay

## 2018-03-04 NOTE — Telephone Encounter (Signed)
Contacted pt to go over lab results pt is aware and doesn't have any questions or concerns 

## 2018-03-05 ENCOUNTER — Other Ambulatory Visit: Payer: Self-pay

## 2018-03-15 ENCOUNTER — Ambulatory Visit
Admission: RE | Admit: 2018-03-15 | Discharge: 2018-03-15 | Disposition: A | Discharge status: Home / Self Care | Payer: No Typology Code available for payment source | Source: Ambulatory Visit | Attending: Neurology | Admitting: Neurology

## 2018-03-15 ENCOUNTER — Other Ambulatory Visit: Payer: Self-pay | Admitting: Internal Medicine

## 2018-03-15 DIAGNOSIS — F419 Anxiety disorder, unspecified: Secondary | ICD-10-CM

## 2018-03-15 DIAGNOSIS — R251 Tremor, unspecified: Secondary | ICD-10-CM

## 2018-03-15 MED ORDER — GADOBENATE DIMEGLUMINE 529 MG/ML IV SOLN
15.0000 mL | Freq: Once | INTRAVENOUS | Status: AC | PRN
  Administered 2018-03-15: 15 mL via INTRAVENOUS

## 2018-03-15 MED FILL — traZODone HCL 50 MG TABS: 50 | 30 days supply | Qty: 30 | Fill #2

## 2018-03-15 MED FILL — ?ATENOLOL 25MG TABLET: 25 | 30 days supply | Qty: 30 | Fill #5

## 2018-03-15 MED FILL — ?FLUOXETINE HCL 20MG TAB: 20 | 30 days supply | Qty: 30 | Fill #0

## 2018-03-15 MED FILL — busPIRone HCL 7.5 MG TABS: 7.5 | 30 days supply | Qty: 120 | Fill #1

## 2018-03-19 NOTE — Progress Notes (Signed)
Please call patient and advise her that the MRI brain w/wo showed a couple of incidental findings, but nothing to explain her tremor:  1. 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 This means, that she was born with extra veins, which bundle or bunch up and generally remain without consequence. But we can certainly monitor with a repeat scan in the future. Symptoms in rare instances can be severe headaches, if there is bleeding from the venous structure or very rarely seizures. Nothing actually to do at this point.  2. The pituitary gland, which sits in the middle of the brain is somewhat asymmetric looking, which does not really signify any abnormality and would not explain her tremor per se. No obvious tumor in the gland and it picks up contrast normally. Nevertheless, there is another MRI, which specifically looks at the gland in more detail. It may be worthwhile looking with another MRI at the pituitary gland again. If okay, I will order, just for reassurance.   Overall: no acute changes, age-appropriate size and look of the brain. No stroke, no tumor. No explanation for her tremor.  Let me know, if she wants to proceed with dedicated pituitary MRI brain.  Star Age, MD, PhD [facility])

## 2018-03-22 ENCOUNTER — Telehealth: Payer: Self-pay

## 2018-03-22 DIAGNOSIS — R251 Tremor, unspecified: Secondary | ICD-10-CM

## 2018-03-22 NOTE — Telephone Encounter (Signed)
-----   Message from Star Age, MD sent at 03/19/2018 11:26 AM EDT ----- Please call patient and advise her that the MRI brain w/wo showed a couple of incidental findings, but nothing to explain her tremor:  1. 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 This means, that she was born with extra veins, which bundle or bunch up and generally remain without consequence. But we can certainly monitor with a repeat scan in the future. Symptoms in rare instances can be severe headaches, if there is bleeding from the venous structure or very rarely seizures. Nothing actually to do at this point.  2. The pituitary gland, which sits in the middle of the brain is somewhat asymmetric looking, which does not really signify any abnormality and would not explain her tremor per se. No obvious tumor in the gland and it picks up contrast normally. Nevertheless, there is another MRI, which specifically looks at the gland in more detail. It may be worthwhile looking with another MRI at the pituitary gland again. If okay, I will order, just for reassurance.   Overall: no acute changes, age-appropriate size and look of the brain. No stroke, no tumor. No explanation for her tremor.  Let me know, if she wants to proceed with dedicated pituitary MRI brain.  Star Age, MD, PhD [facility])

## 2018-03-22 NOTE — Telephone Encounter (Signed)
I called pt, had an extended conversation with her regarding the MRI results. Pt is agreeable to the MRI brain with special attention to the pituitary. Pt denies severe headaches or seizures, which may be related to the developmental venous anomaly. Pt reports that she ran out of buspar and prozac and noticed that while not taking those medications, her tremors in her hands improved slightly. I advised pt that I will call her when the MRI pituitary results are reviewed. Pt verbalized understanding of results. Pt had no questions at this time but was encouraged to call back if questions arise.

## 2018-03-22 NOTE — Telephone Encounter (Signed)
VO received from Dr. Rexene Alberts for MRI brain W/wo contrast with special attention to pituitary gland. Order placed.

## 2018-03-28 ENCOUNTER — Other Ambulatory Visit: Payer: Self-pay | Admitting: Internal Medicine

## 2018-03-28 DIAGNOSIS — R195 Other fecal abnormalities: Secondary | ICD-10-CM

## 2018-03-28 LAB — FECAL OCCULT BLOOD, IMMUNOCHEMICAL: FECAL OCCULT BLD: POSITIVE — AB

## 2018-03-29 ENCOUNTER — Telehealth: Payer: Self-pay

## 2018-03-29 NOTE — Telephone Encounter (Signed)
Contacted pt to go over fit test results pt is aware and doesn't have any questions or concerns

## 2018-03-30 ENCOUNTER — Encounter: Payer: Self-pay | Admitting: Internal Medicine

## 2018-03-31 ENCOUNTER — Ambulatory Visit
Admission: RE | Admit: 2018-03-31 | Discharge: 2018-03-31 | Disposition: A | Discharge status: Home / Self Care | Payer: No Typology Code available for payment source | Source: Ambulatory Visit | Attending: Neurology | Admitting: Neurology

## 2018-03-31 ENCOUNTER — Other Ambulatory Visit: Payer: No Typology Code available for payment source

## 2018-03-31 DIAGNOSIS — R251 Tremor, unspecified: Secondary | ICD-10-CM

## 2018-03-31 MED ORDER — GADOBENATE DIMEGLUMINE 529 MG/ML IV SOLN
9.0000 mL | Freq: Once | INTRAVENOUS | Status: AC | PRN
  Administered 2018-03-31: 9 mL via INTRAVENOUS

## 2018-04-01 ENCOUNTER — Other Ambulatory Visit (HOSPITAL_COMMUNITY): Payer: Self-pay | Admitting: *Deleted

## 2018-04-01 DIAGNOSIS — N6320 Unspecified lump in the left breast, unspecified quadrant: Secondary | ICD-10-CM

## 2018-04-06 ENCOUNTER — Telehealth: Payer: Self-pay | Admitting: Neurology

## 2018-04-06 NOTE — Telephone Encounter (Signed)
-----   Message from Lester [place], RN sent at 04/06/2018  1:36 PM EDT ----- Durene Cal to [facility], on behalf of Dr. Rexene Alberts, who is out of the office. ----- Message ----- From: Penni Bombard, MD Sent: 04/01/2018   7:32 PM To: Star Age, MD

## 2018-04-06 NOTE — Telephone Encounter (Signed)
I tried to call the patient and emergency contact, all the numbers listed are not operational.  The purpose of the repeat MRI of the brain was to look for evidence of a pituitary adenoma, no evidence of this.  Otherwise minimal white matter disease, nothing that should result in tremors.    MRI brain 04/06/18:  IMPRESSION:   MRI brain and pituitary (with and without) demonstrating: 1.  Mild scattered periventricular subcortical foci of nonspecific gliosis. 2.  Dedicated pituitary gland imaging appears normal enhancing pituitary gland tissue.  No evidence of underlying pituitary mass. 3.  Incidental right frontal developmental venous anomaly.

## 2018-04-07 NOTE — Telephone Encounter (Signed)
I called pt again to discuss her MRI. Numbers listed are "unreachable." No VM available. Will try again later.

## 2018-04-09 NOTE — Telephone Encounter (Signed)
I called pt. I advised her that Dr. Jannifer Franklin reviewed her MRI and found that her pituitary gland looks normal. Pt verbalized understanding of results. Pt had no questions at this time but was encouraged to call back if questions arise.

## 2018-04-16 ENCOUNTER — Other Ambulatory Visit: Payer: Self-pay | Admitting: Internal Medicine

## 2018-04-16 DIAGNOSIS — F5104 Psychophysiologic insomnia: Secondary | ICD-10-CM

## 2018-04-16 MED FILL — busPIRone HCL 7.5 MG TABS: 7.5 | 30 days supply | Qty: 120 | Fill #2

## 2018-04-16 MED FILL — traZODone HCL 50 MG TABS: 50 | 30 days supply | Qty: 30 | Fill #0

## 2018-04-16 MED FILL — FLUoxetine HCL 20 MG TABS: 20 | 30 days supply | Qty: 30 | Fill #1

## 2018-04-16 MED FILL — ?ATENOLOL 25MG TABLET: 25 | 30 days supply | Qty: 30 | Fill #6

## 2018-04-22 ENCOUNTER — Other Ambulatory Visit: Payer: No Typology Code available for payment source

## 2018-04-22 ENCOUNTER — Ambulatory Visit (HOSPITAL_COMMUNITY): Payer: Self-pay

## 2018-04-30 ENCOUNTER — Other Ambulatory Visit (HOSPITAL_COMMUNITY): Payer: Self-pay | Admitting: *Deleted

## 2018-04-30 DIAGNOSIS — N6320 Unspecified lump in the left breast, unspecified quadrant: Secondary | ICD-10-CM

## 2018-05-14 ENCOUNTER — Ambulatory Visit: Payer: Self-pay | Attending: Family Medicine

## 2018-05-14 MED FILL — ?FLUOXETINE HCL 20MG TABLET: 20 | 30 days supply | Qty: 30 | Fill #2

## 2018-05-14 MED FILL — busPIRone HCL 7.5 MG TABS: 7.5 | 30 days supply | Qty: 120 | Fill #3

## 2018-05-14 MED FILL — ?ATENOLOL 25MG TABLET: 25 | 30 days supply | Qty: 30 | Fill #7

## 2018-05-14 MED FILL — ?TRAZODONE HCL 50MG TABS: 50 | 30 days supply | Qty: 30 | Fill #1

## 2018-06-01 ENCOUNTER — Other Ambulatory Visit: Payer: Self-pay

## 2018-06-01 ENCOUNTER — Ambulatory Visit (HOSPITAL_COMMUNITY): Payer: Self-pay

## 2018-06-03 ENCOUNTER — Ambulatory Visit: Payer: Self-pay | Admitting: Internal Medicine

## 2018-06-04 ENCOUNTER — Telehealth (HOSPITAL_COMMUNITY): Payer: Self-pay

## 2018-06-04 NOTE — Telephone Encounter (Signed)
Tried to reach for reschedule. I left a message to call [facility]

## 2018-06-16 ENCOUNTER — Other Ambulatory Visit: Payer: Self-pay | Admitting: Internal Medicine

## 2018-06-16 DIAGNOSIS — F419 Anxiety disorder, unspecified: Secondary | ICD-10-CM

## 2018-06-16 MED ORDER — BUSPIRONE HCL 15 MG PO TABS: 15.0000 mg | ORAL_TABLET | Freq: Two times a day (BID) | ORAL | 2 refills | Status: DC

## 2018-06-16 MED FILL — ?FLUOXETINE HCL 20MG TABLET: 20 | 30 days supply | Qty: 30 | Fill #0

## 2018-06-16 MED FILL — traZODone HCL 50 MG TABS: 50 | 30 days supply | Qty: 30 | Fill #2

## 2018-06-16 MED FILL — ATENOLOL 25 MG TABLET: 25 | 30 days supply | Qty: 30 | Fill #8

## 2018-06-16 MED FILL — busPIRone HCL 15 MG TABS: 15 | 30 days supply | Qty: 60 | Fill #0

## 2018-06-16 NOTE — Telephone Encounter (Signed)
Switched back to Buspirone 15mg  instead of pt taking two 7.5mg  tabs since the shortage on buspirone is over.

## 2018-06-25 ENCOUNTER — Ambulatory Visit (INDEPENDENT_AMBULATORY_CARE_PROVIDER_SITE_OTHER): Payer: Self-pay | Admitting: Gastroenterology

## 2018-06-25 ENCOUNTER — Encounter: Payer: Self-pay | Admitting: Gastroenterology

## 2018-06-25 ENCOUNTER — Encounter: Payer: Self-pay | Admitting: *Deleted

## 2018-06-25 ENCOUNTER — Other Ambulatory Visit: Payer: Self-pay | Admitting: *Deleted

## 2018-06-25 VITALS — BP 135/89 | HR 68 | Temp 96.5°F | Ht 65.0 in | Wt 169.6 lb

## 2018-06-25 DIAGNOSIS — R195 Other fecal abnormalities: Secondary | ICD-10-CM

## 2018-06-25 NOTE — Assessment & Plan Note (Signed)
62 year old female presenting for further evaluation of Hemoccult positive stool.  No evidence of anemia back in May.  No overt GI bleeding.  Possible remote colonoscopy.  Recommend colonoscopy in the near future.  Plan for deep sedation given the urgency.  I have discussed the risks, alternatives, benefits with regards to but not limited to the risk of reaction to medication, bleeding, infection, perforation and the patient is agreeable to proceed. Written consent to be obtained.

## 2018-06-25 NOTE — Progress Notes (Signed)
Primary Care Physician:  Ladell Pier, MD  Primary Gastroenterologist:  Garfield Cornea, MD   Chief Complaint  Patient presents with  . + FIT    HPI:  Kimberly [name] is a 62 y.o. female here at the request of Dr. Wynetta Emery for further evaluation of hemoccult positive stool.   She's not sure if she's ever had a colonoscopy. If so, it was years ago. She has occasional loose stool after certain foods. No melena, brbpr. No abdominal pain. No heartburn. No dysphagia. Appetite is good. No weight loss.   She has chronic tremors involving right head and now head seeing neurology.   Current Outpatient Medications  Medication Sig Dispense Refill  . atenolol (TENORMIN) 25 MG tablet Take 1 tablet (25 mg total) by mouth daily. 90 tablet 3  . busPIRone (BUSPAR) 15 MG tablet Take 1 tablet (15 mg total) by mouth 2 (two) times daily. 60 tablet 2  . FLUoxetine (PROZAC) 20 MG tablet TAKE 1 TABLET (20 MG TOTAL) BY MOUTH DAILY. 90 tablet 0  . ibuprofen (ADVIL,MOTRIN) 600 MG tablet TAKE 1 TABLET BY MOUTH EVERY 8 HOURS AS NEEDED. 30 tablet 0  . Multiple Vitamin (MULTIVITAMIN WITH MINERALS) TABS tablet Take 1 tablet by mouth daily. Reported on 09/28/2015    . traZODone (DESYREL) 50 MG tablet TAKE 1 TABLET (50 MG TOTAL) BY MOUTH AT BEDTIME AS NEEDED. FOR SLEEP 30 tablet 2   No current facility-administered medications for this visit.     Allergies as of 06/25/2018 - Review Complete 06/25/2018  Allergen Reaction Noted  . Codeine Other (See Comments) 08/03/2014    Past Medical History:  Diagnosis Date  . Anxiety 1995  . Depression 1995  . Hypertension Dx Dec 2015  . Panic attack 1995  . Tremor     Past Surgical History:  Procedure Laterality Date  . APPENDECTOMY    . bowel obstruction    . CHOLECYSTECTOMY    . TONSILLECTOMY      Family History  Problem Relation Age of Onset  . Breast cancer Mother   . Cancer Maternal Grandmother        ?type  . Heart disease Paternal Grandfather   .  Colon cancer Neg Hx     Social History   Socioeconomic History  . Marital status: Single    Spouse name: Not on file  . Number of children: Not on file  . Years of education: Not on file  . Highest education level: Not on file  Occupational History  . Not on file  Social Needs  . Financial resource strain: Not on file  . Food insecurity:    Worry: Not on file    Inability: Not on file  . Transportation needs:    Medical: Not on file    Non-medical: Not on file  Tobacco Use  . Smoking status: Never Smoker  . Smokeless tobacco: Never Used  Substance and Sexual Activity  . Alcohol use: No    Alcohol/week: 0.0 standard drinks  . Drug use: No  . Sexual activity: Not on file  Lifestyle  . Physical activity:    Days per week: Not on file    Minutes per session: Not on file  . Stress: Not on file  Relationships  . Social connections:    Talks on phone: Not on file    Gets together: Not on file    Attends religious service: Not on file    Active member of club or organization: Not on  file    Attends meetings of clubs or organizations: Not on file    Relationship status: Not on file  . Intimate partner violence:    Fear of current or ex partner: Not on file    Emotionally abused: Not on file    Physically abused: Not on file    Forced sexual activity: Not on file  Other Topics Concern  . Not on file  Social History Narrative  . Not on file      ROS:  General: Negative for anorexia, weight loss, fever, chills, fatigue, weakness. Eyes: Negative for vision changes.  ENT: Negative for hoarseness, difficulty swallowing , nasal congestion. CV: Negative for chest pain, angina, palpitations, dyspnea on exertion, peripheral edema.  Respiratory: Negative for dyspnea at rest, dyspnea on exertion, cough, sputum, wheezing.  GI: See history of present illness. GU:  Negative for dysuria, hematuria, urinary incontinence, urinary frequency, nocturnal urination.  MS: Negative for  joint pain, low back pain.  Derm: Negative for rash or itching.  Neuro: Negative for weakness, abnormal sensation, seizure, frequent headaches, memory loss, confusion. See hpi Psych: Negative for anxiety, depression, suicidal ideation, hallucinations.  Endo: Negative for unusual weight change.  Heme: Negative for bruising or bleeding. Allergy: Negative for rash or hives.    Physical Examination:  BP 135/89   Pulse 68   Temp (!) 96.5 F (35.8 C) (Oral)   Ht 5\' 5"  (1.651 m)   Wt 169 lb 9.6 oz (76.9 kg)   BMI 28.22 kg/m    General: Well-nourished, well-developed in no acute distress.  Head: Normocephalic, atraumatic.   Eyes: Conjunctiva pink, no icterus. Mouth: Oropharyngeal mucosa moist and pink , no lesions erythema or exudate. Neck: Supple without thyromegaly, masses, or lymphadenopathy.  Lungs: Clear to auscultation bilaterally.  Heart: Regular rate and rhythm, no murmurs rubs or gallops.  Abdomen: Bowel sounds are normal, nontender, nondistended, no hepatosplenomegaly or masses, no abdominal bruits or    hernia , no rebound or guarding.   Rectal: not performed Extremities: No lower extremity edema. No clubbing or deformities.  Neuro: Alert and oriented x 4 , grossly normal neurologically.  Skin: Warm and dry, no rash or jaundice.   Psych: Alert and cooperative, normal mood and affect.  Labs: Lab Results  Component Value Date   CREATININE 0.94 03/02/2018   BUN 15 03/02/2018   NA 139 03/02/2018   K 4.6 03/02/2018   CL 101 03/02/2018   CO2 19 (L) 03/02/2018   Lab Results  Component Value Date   ALT 14 03/02/2018   AST 22 03/02/2018   ALKPHOS 89 03/02/2018   BILITOT 0.3 03/02/2018   Lab Results  Component Value Date   WBC 6.3 03/02/2018   HGB 15.1 03/02/2018   HCT 44.7 03/02/2018   MCV 90 03/02/2018   PLT 418 03/02/2018   Lab Results  Component Value Date   TSH 1.810 03/02/2018      Imaging Studies: No results found.

## 2018-06-25 NOTE — Patient Instructions (Signed)
Colonoscopy as scheduled. See separate instructions.  

## 2018-06-28 ENCOUNTER — Telehealth: Payer: Self-pay | Admitting: *Deleted

## 2018-06-28 NOTE — Telephone Encounter (Signed)
Pre-op scheduled for 07/14/18 at 2:45pm. LMOVM. Letter mailed.

## 2018-06-28 NOTE — Progress Notes (Signed)
CC'D TO PCP °

## 2018-07-12 ENCOUNTER — Telehealth: Payer: Self-pay | Admitting: Internal Medicine

## 2018-07-12 NOTE — Telephone Encounter (Signed)
Pt called office, TCS w/Propofol w/[facility] rescheduled to 08/05/18 at 1:45pm (she requested the week before Halloween). LMOVM to inform endo scheduler. Will mail new instructions after new pre-op is scheduled.

## 2018-07-12 NOTE — Telephone Encounter (Signed)
Tried to call pt back at number listed, no answer, LMOVM for return call. Tried to call number listed in chart, recording states not reachable.

## 2018-07-12 NOTE — Telephone Encounter (Signed)
Called and informed pt of pre-op appt 07/30/18 at 11:00am. Letter mailed with new procedure instructions.

## 2018-07-12 NOTE — Telephone Encounter (Signed)
Pt needs to reschedule her procedure with [facility] on 10/7 and was asking could it be scheduled the week of Halloween. Please advise [phone]

## 2018-07-14 ENCOUNTER — Other Ambulatory Visit (HOSPITAL_COMMUNITY): Payer: Self-pay

## 2018-07-15 ENCOUNTER — Inpatient Hospital Stay (HOSPITAL_COMMUNITY): Admission: RE | Admit: 2018-07-15 | Payer: Self-pay | Source: Ambulatory Visit

## 2018-07-16 ENCOUNTER — Other Ambulatory Visit: Payer: Self-pay | Admitting: Internal Medicine

## 2018-07-16 DIAGNOSIS — F5104 Psychophysiologic insomnia: Secondary | ICD-10-CM

## 2018-07-16 MED FILL — ?FLUOXETINE HCL 20MG TABLET: 20 | 30 days supply | Qty: 30 | Fill #1

## 2018-07-16 MED FILL — ?TRAZODONE HCL 50MG TABS: 50 | 30 days supply | Qty: 30 | Fill #0

## 2018-07-16 MED FILL — busPIRone HCL 15 MG TABS: 15 | 30 days supply | Qty: 60 | Fill #1

## 2018-07-16 MED FILL — ATENOLOL 25 MG TABLET: 25 | 30 days supply | Qty: 30 | Fill #9

## 2018-07-26 NOTE — Patient Instructions (Signed)
Kimberly [name]  07/26/2018     @PREFPERIOPPHARMACY @   Your procedure is scheduled on  08/05/2018 .  Report to [facility] at  1215  P.M.  Call this number if you have problems the morning of surgery:  [phone]   Remember:  Follow the diet and prep instructions given to you by Dr Roseanne Kaufman office.                          Take these medicines the morning of surgery with A SIP OF WATER Atenolol, buspar, prozac.    Do not wear jewelry, make-up or nail polish.  Do not wear lotions, powders, or perfumes, or deodorant.  Do not shave 48 hours prior to surgery.  Men may shave face and neck.  Do not bring valuables to the hospital.  [facility] is not responsible for any belongings or valuables.  Contacts, dentures or bridgework may not be worn into surgery.  Leave your suitcase in the car.  After surgery it may be brought to your room.  For patients admitted to the hospital, discharge time will be determined by your treatment team.  Patients discharged the day of surgery will not be allowed to drive home.   Name and phone number of your driver:   family Special instructions:  None  Please read over the following fact sheets that you were given. Anesthesia Post-op Instructions and Care and Recovery After Surgery       Colonoscopy, Adult A colonoscopy is an exam to look at the large intestine. It is done to check for problems, such as:  Lumps (tumors).  Growths (polyps).  Swelling (inflammation).  Bleeding.  What happens before the procedure? Eating and drinking Follow instructions from your doctor about eating and drinking. These instructions may include:  A few days before the procedure - follow a low-fiber diet. ? Avoid nuts. ? Avoid seeds. ? Avoid dried fruit. ? Avoid raw fruits. ? Avoid vegetables.  1-3 days before the procedure - follow a clear liquid diet. Avoid liquids that have red or purple dye. Drink only clear liquids, such  as: ? Clear broth or bouillon. ? Black coffee or tea. ? Clear juice. ? Clear soft drinks or sports drinks. ? Gelatin dessert. ? Popsicles.  On the day of the procedure - do not eat or drink anything during the 2 hours before the procedure.  Bowel prep If you were prescribed an oral bowel prep:  Take it as told by your doctor. Starting the day before your procedure, you will need to drink a lot of liquid. The liquid will cause you to poop (have bowel movements) until your poop is almost clear or light green.  If your skin or butt gets irritated from diarrhea, you may: ? Wipe the area with wipes that have medicine in them, such as adult wet wipes with aloe and vitamin E. ? Put something on your skin that soothes the area, such as petroleum jelly.  If you throw up (vomit) while drinking the bowel prep, take a break for up to 60 minutes. Then begin the bowel prep again. If you keep throwing up and you cannot take the bowel prep without throwing up, call your doctor.  General instructions  Ask your doctor about changing or stopping your normal medicines. This is important if you take diabetes medicines or blood thinners.  Plan to have someone take you  home from the hospital or clinic. What happens during the procedure?  An IV tube may be put into one of your veins.  You will be given medicine to help you relax (sedative).  To reduce your risk of infection: ? Your doctors will wash their hands. ? Your anal area will be washed with soap.  You will be asked to lie on your side with your knees bent.  Your doctor will get a long, thin, flexible tube ready. The tube will have a camera and a light on the end.  The tube will be put into your anus.  The tube will be gently put into your large intestine.  Air will be delivered into your large intestine to keep it open. You may feel some pressure or cramping.  The camera will be used to take photos.  A small tissue sample may be  removed from your body to be looked at under a microscope (biopsy). If any possible problems are found, the tissue will be sent to a lab for testing.  If small growths are found, your doctor may remove them and have them checked for cancer.  The tube that was put into your anus will be slowly removed. The procedure may vary among doctors and hospitals. What happens after the procedure?  Your doctor will check on you often until the medicines you were given have worn off.  Do not drive for 24 hours after the procedure.  You may have a small amount of blood in your poop.  You may pass gas.  You may have mild cramps or bloating in your belly (abdomen).  It is up to you to get the results of your procedure. Ask your doctor, or the department performing the procedure, when your results will be ready. This information is not intended to replace advice given to you by your health care provider. Make sure you discuss any questions you have with your health care provider. Document Released: 11/01/2010 Document Revised: 07/30/2016 Document Reviewed: 12/11/2015 Elsevier Interactive Patient Education  2017 [employer]  Colonoscopy, Adult, Care After This sheet gives you information about how to care for yourself after your procedure. Your health care provider may also give you more specific instructions. If you have problems or questions, contact your health care provider. What can I expect after the procedure? After the procedure, it is common to have:  A small amount of blood in your stool for 24 hours after the procedure.  Some gas.  Mild abdominal cramping or bloating.  Follow these instructions at home: General instructions   For the first 24 hours after the procedure: ? Do not drive or use machinery. ? Do not sign important documents. ? Do not drink alcohol. ? Do your regular daily activities at a slower pace than normal. ? Eat soft, easy-to-digest foods. ? Rest  often.  Take over-the-counter or prescription medicines only as told by your health care provider.  It is up to you to get the results of your procedure. Ask your health care provider, or the department performing the procedure, when your results will be ready. Relieving cramping and bloating  Try walking around when you have cramps or feel bloated.  Apply heat to your abdomen as told by your health care provider. Use a heat source that your health care provider recommends, such as a moist heat pack or a heating pad. ? Place a towel between your skin and the heat source. ? Leave the heat on for 20-30 minutes. ?  Remove the heat if your skin turns bright red. This is especially important if you are unable to feel pain, heat, or cold. You may have a greater risk of getting burned. Eating and drinking  Drink enough fluid to keep your urine clear or pale yellow.  Resume your normal diet as instructed by your health care provider. Avoid heavy or fried foods that are hard to digest.  Avoid drinking alcohol for as long as instructed by your health care provider. Contact a health care provider if:  You have blood in your stool 2-3 days after the procedure. Get help right away if:  You have more than a small spotting of blood in your stool.  You pass large blood clots in your stool.  Your abdomen is swollen.  You have nausea or vomiting.  You have a fever.  You have increasing abdominal pain that is not relieved with medicine. This information is not intended to replace advice given to you by your health care provider. Make sure you discuss any questions you have with your health care provider. Document Released: 05/13/2004 Document Revised: 06/23/2016 Document Reviewed: 12/11/2015 Elsevier Interactive Patient Education  2018 [place] Anesthesia is a term that refers to techniques, procedures, and medicines that help a person stay safe and comfortable  during a medical procedure. Monitored anesthesia care, or sedation, is one type of anesthesia. Your anesthesia specialist may recommend sedation if you will be having a procedure that does not require you to be unconscious, such as:  Cataract surgery.  A dental procedure.  A biopsy.  A colonoscopy.  During the procedure, you may receive a medicine to help you relax (sedative). There are three levels of sedation:  Mild sedation. At this level, you may feel awake and relaxed. You will be able to follow directions.  Moderate sedation. At this level, you will be sleepy. You may not remember the procedure.  Deep sedation. At this level, you will be asleep. You will not remember the procedure.  The more medicine you are given, the deeper your level of sedation will be. Depending on how you respond to the procedure, the anesthesia specialist may change your level of sedation or the type of anesthesia to fit your needs. An anesthesia specialist will monitor you closely during the procedure. Let your health care provider know about:  Any allergies you have.  All medicines you are taking, including vitamins, herbs, eye drops, creams, and over-the-counter medicines.  Any use of steroids (by mouth or as a cream).  Any problems you or family members have had with sedatives and anesthetic medicines.  Any blood disorders you have.  Any surgeries you have had.  Any medical conditions you have, such as sleep apnea.  Whether you are pregnant or may be pregnant.  Any use of cigarettes, alcohol, or street drugs. What are the risks? Generally, this is a safe procedure. However, problems may occur, including:  Getting too much medicine (oversedation).  Nausea.  Allergic reaction to medicines.  Trouble breathing. If this happens, a breathing tube may be used to help with breathing. It will be removed when you are awake and breathing on your own.  Heart trouble.  Lung trouble.  Before  the procedure Staying hydrated Follow instructions from your health care provider about hydration, which may include:  Up to 2 hours before the procedure - you may continue to drink clear liquids, such as water, clear fruit juice, black coffee, and plain tea.  Eating and drinking restrictions Follow instructions from your health care provider about eating and drinking, which may include:  8 hours before the procedure - stop eating heavy meals or foods such as meat, fried foods, or fatty foods.  6 hours before the procedure - stop eating light meals or foods, such as toast or cereal.  6 hours before the procedure - stop drinking milk or drinks that contain milk.  2 hours before the procedure - stop drinking clear liquids.  Medicines Ask your health care provider about:  Changing or stopping your regular medicines. This is especially important if you are taking diabetes medicines or blood thinners.  Taking medicines such as aspirin and ibuprofen. These medicines can thin your blood. Do not take these medicines before your procedure if your health care provider instructs you not to.  Tests and exams  You will have a physical exam.  You may have blood tests done to show: ? How well your kidneys and liver are working. ? How well your blood can clot.  General instructions  Plan to have someone take you home from the hospital or clinic.  If you will be going home right after the procedure, plan to have someone with you for 24 hours.  What happens during the procedure?  Your blood pressure, heart rate, breathing, level of pain and overall condition will be monitored.  An IV tube will be inserted into one of your veins.  Your anesthesia specialist will give you medicines as needed to keep you comfortable during the procedure. This may mean changing the level of sedation.  The procedure will be performed. After the procedure  Your blood pressure, heart rate, breathing rate, and  blood oxygen level will be monitored until the medicines you were given have worn off.  Do not drive for 24 hours if you received a sedative.  You may: ? Feel sleepy, clumsy, or nauseous. ? Feel forgetful about what happened after the procedure. ? Have a sore throat if you had a breathing tube during the procedure. ? Vomit. This information is not intended to replace advice given to you by your health care provider. Make sure you discuss any questions you have with your health care provider. Document Released: 06/25/2005 Document Revised: 03/07/2016 Document Reviewed: 01/20/2016 Elsevier Interactive Patient Education  2018 [place], Care After These instructions provide you with information about caring for yourself after your procedure. Your health care provider may also give you more specific instructions. Your treatment has been planned according to current medical practices, but problems sometimes occur. Call your health care provider if you have any problems or questions after your procedure. What can I expect after the procedure? After your procedure, it is common to:  Feel sleepy for several hours.  Feel clumsy and have poor balance for several hours.  Feel forgetful about what happened after the procedure.  Have poor judgment for several hours.  Feel nauseous or vomit.  Have a sore throat if you had a breathing tube during the procedure.  Follow these instructions at home: For at least 24 hours after the procedure:   Do not: ? Participate in activities in which you could fall or become injured. ? Drive. ? Use heavy machinery. ? Drink alcohol. ? Take sleeping pills or medicines that cause drowsiness. ? Make important decisions or sign legal documents. ? Take care of children on your own.  Rest. Eating and drinking  Follow the diet that is recommended by your  health care provider.  If you vomit, drink water, juice, or soup when you  can drink without vomiting.  Make sure you have little or no nausea before eating solid foods. General instructions  Have a responsible adult stay with you until you are awake and alert.  Take over-the-counter and prescription medicines only as told by your health care provider.  If you smoke, do not smoke without supervision.  Keep all follow-up visits as told by your health care provider. This is important. Contact a health care provider if:  You keep feeling nauseous or you keep vomiting.  You feel light-headed.  You develop a rash.  You have a fever. Get help right away if:  You have trouble breathing. This information is not intended to replace advice given to you by your health care provider. Make sure you discuss any questions you have with your health care provider. Document Released: 01/20/2016 Document Revised: 05/21/2016 Document Reviewed: 01/20/2016 Elsevier Interactive Patient Education  [employer].

## 2018-07-30 ENCOUNTER — Encounter (HOSPITAL_COMMUNITY)
Admission: RE | Admit: 2018-07-30 | Discharge: 2018-07-30 | Disposition: A | Discharge status: Home / Self Care | Payer: Self-pay | Source: Ambulatory Visit | Attending: Internal Medicine | Admitting: Internal Medicine

## 2018-08-03 ENCOUNTER — Telehealth: Payer: Self-pay | Admitting: *Deleted

## 2018-08-03 ENCOUNTER — Encounter (HOSPITAL_COMMUNITY)
Admission: RE | Admit: 2018-08-03 | Discharge: 2018-08-03 | Disposition: A | Discharge status: Home / Self Care | Payer: Self-pay | Source: Ambulatory Visit | Attending: Internal Medicine | Admitting: Internal Medicine

## 2018-08-03 ENCOUNTER — Other Ambulatory Visit: Payer: Self-pay

## 2018-08-03 ENCOUNTER — Encounter (HOSPITAL_COMMUNITY): Payer: Self-pay

## 2018-08-03 DIAGNOSIS — Z01818 Encounter for other preprocedural examination: Secondary | ICD-10-CM | POA: Insufficient documentation

## 2018-08-03 HISTORY — DX: Personal history of other diseases of the circulatory system: Z86.79

## 2018-08-03 HISTORY — DX: Personal history of urinary calculi: Z87.442

## 2018-08-03 LAB — CBC
HCT: 42.6 % (ref 36.0–46.0)
Hemoglobin: 13.7 g/dL (ref 12.0–15.0)
MCH: 30.6 pg (ref 26.0–34.0)
MCHC: 32.2 g/dL (ref 30.0–36.0)
MCV: 95.1 fL (ref 80.0–100.0)
PLATELETS: 439 10*3/uL — AB (ref 150–400)
RBC: 4.48 MIL/uL (ref 3.87–5.11)
RDW: 13.2 % (ref 11.5–15.5)
WBC: 7.5 10*3/uL (ref 4.0–10.5)
nRBC: 0 % (ref 0.0–0.2)

## 2018-08-03 LAB — BASIC METABOLIC PANEL
ANION GAP: 8 (ref 5–15)
BUN: 10 mg/dL (ref 8–23)
CALCIUM: 9.3 mg/dL (ref 8.9–10.3)
CO2: 24 mmol/L (ref 22–32)
CREATININE: 0.87 mg/dL (ref 0.44–1.00)
Chloride: 107 mmol/L (ref 98–111)
GFR calc Af Amer: >60 mL/min (ref >60)
Glucose, Bld: 164 mg/dL — ABNORMAL HIGH (ref 70–99)
Potassium: 3.6 mmol/L (ref 3.5–5.1)
Sodium: 139 mmol/L (ref 135–145)

## 2018-08-03 NOTE — Telephone Encounter (Signed)
Spoke with patient. New procedure time on 08/05/18 is now 1:00pm. Patient aware to arrive at 11:30am at the [facility] Stay. She is also aware the day of procedure she will drink 2nd half of prep at 8:00am, npo after 10am. Called carolyn in Endo and made aware.

## 2018-08-03 NOTE — Pre-Procedure Instructions (Signed)
Patient states, "the office called me today and told me they moved my procedure to earlier than they first told me it would be." I checked with scheduler and patients time had changed. I adjusted prep times on her prep instructions to coincide with her new procedure times.

## 2018-08-05 ENCOUNTER — Ambulatory Visit (HOSPITAL_COMMUNITY): Payer: Self-pay | Admitting: Anesthesiology

## 2018-08-05 ENCOUNTER — Encounter (HOSPITAL_COMMUNITY): Payer: Self-pay | Admitting: *Deleted

## 2018-08-05 ENCOUNTER — Encounter (HOSPITAL_COMMUNITY)
Admission: RE | Disposition: A | Discharge status: Home / Self Care | Payer: Self-pay | Source: Ambulatory Visit | Attending: Internal Medicine

## 2018-08-05 ENCOUNTER — Other Ambulatory Visit: Payer: Self-pay

## 2018-08-05 ENCOUNTER — Ambulatory Visit (HOSPITAL_COMMUNITY)
Admission: RE | Admit: 2018-08-05 | Discharge: 2018-08-05 | Disposition: A | Discharge status: Home / Self Care | Payer: Self-pay | Source: Ambulatory Visit | Attending: Internal Medicine | Admitting: Internal Medicine

## 2018-08-05 DIAGNOSIS — I10 Essential (primary) hypertension: Secondary | ICD-10-CM | POA: Insufficient documentation

## 2018-08-05 DIAGNOSIS — F329 Major depressive disorder, single episode, unspecified: Secondary | ICD-10-CM | POA: Insufficient documentation

## 2018-08-05 DIAGNOSIS — K5730 Diverticulosis of large intestine without perforation or abscess without bleeding: Secondary | ICD-10-CM | POA: Insufficient documentation

## 2018-08-05 DIAGNOSIS — F419 Anxiety disorder, unspecified: Secondary | ICD-10-CM | POA: Insufficient documentation

## 2018-08-05 DIAGNOSIS — R195 Other fecal abnormalities: Secondary | ICD-10-CM | POA: Insufficient documentation

## 2018-08-05 DIAGNOSIS — K640 First degree hemorrhoids: Secondary | ICD-10-CM | POA: Insufficient documentation

## 2018-08-05 HISTORY — PX: COLONOSCOPY WITH PROPOFOL: SHX5780

## 2018-08-05 SURGERY — COLONOSCOPY WITH PROPOFOL
Anesthesia: General

## 2018-08-05 MED ORDER — PROPOFOL 10 MG/ML IV BOLUS
INTRAVENOUS | Status: AC
  Filled 2018-08-05: qty 40

## 2018-08-05 MED ORDER — CHLORHEXIDINE GLUCONATE CLOTH 2 % EX PADS: 6.0000 | MEDICATED_PAD | Freq: Once | CUTANEOUS | Status: DC

## 2018-08-05 MED ORDER — PROPOFOL 10 MG/ML IV BOLUS
INTRAVENOUS | Status: DC | PRN
  Administered 2018-08-05: 20 mg via INTRAVENOUS
  Administered 2018-08-05: 30 mg via INTRAVENOUS

## 2018-08-05 MED ORDER — MIDAZOLAM HCL 2 MG/2ML IJ SOLN: 0.5000 mg | Freq: Once | INTRAMUSCULAR | Status: DC | PRN

## 2018-08-05 MED ORDER — ONDANSETRON HCL 4 MG/2ML IJ SOLN
INTRAMUSCULAR | Status: DC | PRN
  Administered 2018-08-05: 4 mg via INTRAVENOUS

## 2018-08-05 MED ORDER — ONDANSETRON HCL 4 MG/2ML IJ SOLN
INTRAMUSCULAR | Status: AC
  Filled 2018-08-05: qty 2

## 2018-08-05 MED ORDER — PROPOFOL 500 MG/50ML IV EMUL
INTRAVENOUS | Status: DC | PRN
  Administered 2018-08-05: 150 ug/kg/min via INTRAVENOUS

## 2018-08-05 MED ORDER — PROMETHAZINE HCL 25 MG/ML IJ SOLN: 6.2500 mg | INTRAMUSCULAR | Status: DC | PRN

## 2018-08-05 MED ORDER — LACTATED RINGERS IV SOLN
INTRAVENOUS | Status: DC
  Administered 2018-08-05: 12:00:00 via INTRAVENOUS

## 2018-08-05 NOTE — Anesthesia Procedure Notes (Signed)
Procedure Name: General with mask airway Date/Time: 08/05/2018 1:03 PM Performed by: Andree Elk, Amy A, CRNA Pre-anesthesia Checklist: Timeout performed, Patient being monitored, Suction available, Emergency Drugs available and Patient identified Oxygen Delivery Method: Simple face mask

## 2018-08-05 NOTE — H&P (Signed)
@LOGO @   Primary Care Physician:  Ladell Pier, MD Primary Gastroenterologist:  Dr. Gala Romney  Pre-Procedure History & Physical: HPI:  Kimberly [name] is a 62 y.o. female here for colonoscopy to further evaluate Hemoccult positive stool.  Past Medical History:  Diagnosis Date  . Anxiety 1995  . Depression 1995  . History of kidney stones   . History of rheumatic fever as a child   . Hypertension Dx Dec 2015  . Panic attack 1995  . Tremor     Past Surgical History:  Procedure Laterality Date  . APPENDECTOMY    . bowel obstruction    . CHOLECYSTECTOMY    . TONSILLECTOMY      Prior to Admission medications   Medication Sig Start Date End Date Taking? Authorizing Provider  atenolol (TENORMIN) 25 MG tablet Take 1 tablet (25 mg total) by mouth daily. 10/15/17  Yes Ladell Pier, MD  busPIRone (BUSPAR) 15 MG tablet Take 1 tablet (15 mg total) by mouth 2 (two) times daily. 06/16/18  Yes Ladell Pier, MD  FLUoxetine (PROZAC) 20 MG tablet TAKE 1 TABLET (20 MG TOTAL) BY MOUTH DAILY. 06/16/18  Yes Ladell Pier, MD  ibuprofen (ADVIL,MOTRIN) 600 MG tablet TAKE 1 TABLET BY MOUTH EVERY 8 HOURS AS NEEDED. Patient taking differently: Take 600 mg by mouth every 8 (eight) hours as needed for moderate pain.  02/18/16  Yes [name], [name], MD  Multiple Vitamin (MULTIVITAMIN WITH MINERALS) TABS tablet Take 1 tablet by mouth daily.    Yes [provider]  traZODone (DESYREL) 50 MG tablet Take 1 tablet (50 mg total) by mouth at bedtime as needed for sleep. MUST MAKE APPT FOR FURTHER REFILLS 07/16/18  Yes Ladell Pier, MD    Allergies as of 06/25/2018 - Review Complete 06/25/2018  Allergen Reaction Noted  . Codeine Other (See Comments) 08/03/2014    Family History  Problem Relation Age of Onset  . Breast cancer Mother   . Cancer Maternal Grandmother        ?type  . Heart disease Paternal Grandfather   . Colon cancer Neg Hx     Social History   Socioeconomic  History  . Marital status: Single    Spouse name: Not on file  . Number of children: Not on file  . Years of education: Not on file  . Highest education level: Not on file  Occupational History  . Not on file  Social Needs  . Financial resource strain: Not on file  . Food insecurity:    Worry: Not on file    Inability: Not on file  . Transportation needs:    Medical: Not on file    Non-medical: Not on file  Tobacco Use  . Smoking status: Never Smoker  . Smokeless tobacco: Never Used  Substance and Sexual Activity  . Alcohol use: No    Alcohol/week: 0.0 standard drinks  . Drug use: No  . Sexual activity: Yes    Birth control/protection: Post-menopausal  Lifestyle  . Physical activity:    Days per week: Not on file    Minutes per session: Not on file  . Stress: Not on file  Relationships  . Social connections:    Talks on phone: Not on file    Gets together: Not on file    Attends religious service: Not on file    Active member of club or organization: Not on file    Attends meetings of clubs or organizations: Not on file  Relationship status: Not on file  . Intimate partner violence:    Fear of current or ex partner: Not on file    Emotionally abused: Not on file    Physically abused: Not on file    Forced sexual activity: Not on file  Other Topics Concern  . Not on file  Social History Narrative  . Not on file    Review of Systems: See HPI, otherwise negative ROS  Physical Exam: BP (!) 136/92   Pulse 76   Temp 98.1 F (36.7 C) (Oral)   Resp 14   Ht 5\' 5"  (1.651 m)   Wt 75.3 kg   SpO2 94%   BMI 27.62 kg/m  General:   Alert,  Well-developed, well-nourished, pleasant and cooperative in NAD Neck:  Supple; no masses or thyromegaly. No significant cervical adenopathy. Lungs:  Clear throughout to auscultation.   No wheezes, crackles, or rhonchi. No acute distress. Heart:  Regular rate and rhythm; no murmurs, clicks, rubs,  or gallops. Abdomen:  Non-distended, normal bowel sounds.  Soft and nontender without appreciable mass or hepatosplenomegaly.  Pulses:  Normal pulses noted. Extremities:  Without clubbing or edema.  Impression/Plan: 62 year old lady here with Hemoccult positive stool.  Diagnostic colonoscopy today per plan.  The risks, benefits, limitations, alternatives and imponderables have been reviewed with the patient. Questions have been answered. All parties are agreeable.      Notice: This dictation was prepared with Dragon dictation along with smaller phrase technology. Any transcriptional errors that result from this process are unintentional and may not be corrected upon review.

## 2018-08-05 NOTE — Transfer of Care (Signed)
Immediate Anesthesia Transfer of Care Note  Patient: Kimberly [name]  Procedure(s) Performed: COLONOSCOPY WITH PROPOFOL (N/A )  Patient Location: PACU  Anesthesia Type:MAC  Level of Consciousness: awake, alert , oriented and patient cooperative  Airway & Oxygen Therapy: Patient Spontanous Breathing  Post-op Assessment: Report given to RN and Post -op Vital signs reviewed and stable  Post vital signs: Reviewed and stable  Last Vitals:  Vitals Value Taken Time  BP 123/69 08/05/2018  1:27 PM  Temp    Pulse 84 08/05/2018  1:29 PM  Resp 24 08/05/2018  1:29 PM  SpO2 98 % 08/05/2018  1:29 PM  Vitals shown include unvalidated device data.  Last Pain:  Vitals:   08/05/18 1305  TempSrc:   PainSc: 0-No pain      Patients Stated Pain Goal: 5 (99/24/26 8341)  Complications: No apparent anesthesia complications

## 2018-08-05 NOTE — Anesthesia Preprocedure Evaluation (Signed)
Anesthesia Evaluation  Patient identified by MRN, date of birth, ID band Patient awake    Reviewed: Allergy & Precautions, NPO status , Patient's Chart, lab work & pertinent test results, reviewed documented beta blocker date and time   Airway Mallampati: II  TM Distance: >3 FB Neck ROM: Full    Dental no notable dental hx. (+) Teeth Intact   Pulmonary neg pulmonary ROS,    Pulmonary exam normal breath sounds clear to auscultation       Cardiovascular Exercise Tolerance: Good hypertension, Pt. on medications negative cardio ROS Normal cardiovascular examI Rhythm:Regular Rate:Normal     Neuro/Psych Anxiety Depression Pt states has known tremor negative neurological ROS  negative psych ROS   GI/Hepatic negative GI ROS, Neg liver ROS,   Endo/Other  negative endocrine ROS  Renal/GU negative Renal ROS  negative genitourinary   Musculoskeletal negative musculoskeletal ROS (+)   Abdominal   Peds negative pediatric ROS (+)  Hematology negative hematology ROS (+)   Anesthesia Other Findings   Reproductive/Obstetrics negative OB ROS                             Anesthesia Physical Anesthesia Plan  ASA: II  Anesthesia Plan: General   Post-op Pain Management:    Induction: Intravenous  PONV Risk Score and Plan:   Airway Management Planned: Nasal Cannula and Simple Face Mask  Additional Equipment:   Intra-op Plan:   Post-operative Plan: Extubation in OR  Informed Consent: I have reviewed the patients History and Physical, chart, labs and discussed the procedure including the risks, benefits and alternatives for the proposed anesthesia with the patient or authorized representative who has indicated his/her understanding and acceptance.   Dental advisory given  Plan Discussed with: CRNA  Anesthesia Plan Comments:         Anesthesia Quick Evaluation

## 2018-08-05 NOTE — Op Note (Signed)
[facility] Patient Name: Kimberly [name] Procedure Date: 08/05/2018 12:38 PM MRN: [id number] Date of Birth: 1955-11-09 Attending MD: Norvel Richards , MD CSN: [id number] Age: 62 Admit Type: Outpatient Procedure:                Colonoscopy Indications:              Heme positive stool Providers:                Norvel Richards, MD, Otis Peak B. Sharon Seller, RN,                            Aram Candela Referring MD:              Medicines:                Propofol per Anesthesia Complications:            No immediate complications. Estimated Blood Loss:     Estimated blood loss: none. Procedure:                Pre-Anesthesia Assessment:                           - Prior to the procedure, a History and Physical                            was performed, and patient medications and                            allergies were reviewed. The patient's tolerance of                            previous anesthesia was also reviewed. The risks                            and benefits of the procedure and the sedation                            options and risks were discussed with the patient.                            All questions were answered, and informed consent                            was obtained. Prior Anticoagulants: The patient has                            taken no previous anticoagulant or antiplatelet                            agents. ASA Grade Assessment: II - A patient with                            mild systemic disease. After reviewing the risks  and benefits, the patient was deemed in                            satisfactory condition to undergo the procedure.                           After obtaining informed consent, the colonoscope                            was passed under direct vision. Throughout the                            procedure, the patient's blood pressure, pulse, and                            oxygen saturations were  monitored continuously. The                            CF-[id number] ([id number]) scope was introduced through                            the and advanced to the the cecum, identified by                            appendiceal orifice and ileocecal valve. The                            colonoscopy was performed without difficulty. The                            patient tolerated the procedure well. The quality                            of the bowel preparation was adequate. The                            ileocecal valve, appendiceal orifice, and rectum                            were photographed. The entire colon was well                            visualized. Scope In: 1:09:57 PM Scope Out: 1:20:58 PM Scope Withdrawal Time: 0 hours 8 minutes 16 seconds  Total Procedure Duration: 0 hours 11 minutes 1 second  Findings:      The perianal and digital rectal examinations were normal.      Scattered small and large-mouthed diverticula were found in the sigmoid       colon and descending colon.      Non-bleeding internal hemorrhoids were found during retroflexion. The       hemorrhoids were moderate, medium-sized and Grade I (internal       hemorrhoids that do not prolapse).      The exam was otherwise without abnormality on direct and retroflexion       views. Impression:               -  Diverticulosis in the sigmoid colon and in the                            descending colon.                           - Non-bleeding internal hemorrhoids.                           - The examination was otherwise normal on direct                            and retroflexion views.                           - No specimens collected. I doubt significant GI                            bleeding. Recent H&H normal on October 22. Moderate Sedation:      Moderate (conscious) sedation was personally administered by an       anesthesia professional. The following parameters were monitored: oxygen       saturation, heart  rate, blood pressure, respiratory rate, EKG, adequacy       of pulmonary ventilation, and response to care. Recommendation:           - Patient has a contact number available for                            emergencies. The signs and symptoms of potential                            delayed complications were discussed with the                            patient. Return to normal activities tomorrow.                            Written discharge instructions were provided to the                            patient.                           - Resume previous diet.                           - Continue present medications. No further GI                            evaluation at this time unless patient develops                            signs symptoms of ongoing blood loss                           - Repeat colonoscopy in 10 years  for screening                            purposes.                           - Return to GI office PRN. Procedure Code(s):        --- Professional ---                           [phone], Colonoscopy, flexible; diagnostic, including                            collection of specimen(s) by brushing or washing,                            when performed (separate procedure) Diagnosis Code(s):        --- Professional ---                           K64.0, First degree hemorrhoids                           R19.5, Other fecal abnormalities                           K57.30, Diverticulosis of large intestine without                            perforation or abscess without bleeding CPT copyright 2018 American Medical Association. All rights reserved. The codes documented in this report are preliminary and upon coder review may  be revised to meet current compliance requirements. Kimberly [name]. Kimberly Zingaro, MD Norvel Richards, MD 08/05/2018 1:27:13 PM This report has been signed electronically. Number of Addenda: 0

## 2018-08-05 NOTE — Discharge Instructions (Signed)
Colonoscopy Discharge Instructions  Read the instructions outlined below and refer to this sheet in the next few weeks. These discharge instructions provide you with general information on caring for yourself after you leave the hospital. Your doctor may also give you specific instructions. While your treatment has been planned according to the most current medical practices available, unavoidable complications occasionally occur. If you have any problems or questions after discharge, call Dr. Gala Romney at [phone]. ACTIVITY  You may resume your regular activity, but move at a slower pace for the next 24 hours.   Take frequent rest periods for the next 24 hours.   Walking will help get rid of the air and reduce the bloated feeling in your belly (abdomen).   No driving for 24 hours (because of the medicine (anesthesia) used during the test).    Do not sign any important legal documents or operate any machinery for 24 hours (because of the anesthesia used during the test).  NUTRITION  Drink plenty of fluids.   You may resume your normal diet as instructed by your doctor.   Begin with a light meal and progress to your normal diet. Heavy or fried foods are harder to digest and may make you feel sick to your stomach (nauseated).   Avoid alcoholic beverages for 24 hours or as instructed.  MEDICATIONS  You may resume your normal medications unless your doctor tells you otherwise.  WHAT YOU CAN EXPECT TODAY  Some feelings of bloating in the abdomen.   Passage of more gas than usual.   Spotting of blood in your stool or on the toilet paper.  IF YOU HAD POLYPS REMOVED DURING THE COLONOSCOPY:  No aspirin products for 7 days or as instructed.   No alcohol for 7 days or as instructed.   Eat a soft diet for the next 24 hours.  FINDING OUT THE RESULTS OF YOUR TEST Not all test results are available during your visit. If your test results are not back during the visit, make an appointment  with your caregiver to find out the results. Do not assume everything is normal if you have not heard from your caregiver or the medical facility. It is important for you to follow up on all of your test results.  SEEK IMMEDIATE MEDICAL ATTENTION IF:  You have more than a spotting of blood in your stool.   Your belly is swollen (abdominal distention).   You are nauseated or vomiting.   You have a temperature over 101.   You have abdominal pain or discomfort that is severe or gets worse throughout the day.    Diverticulosis and hemorrhoid information provided   Repeat colonoscopy in 10 years for screening purposes   Monitored Anesthesia Care Anesthesia is a term that refers to techniques, procedures, and medicines that help a person stay safe and comfortable during a medical procedure. Monitored anesthesia care, or sedation, is one type of anesthesia. Your anesthesia specialist may recommend sedation if you will be having a procedure that does not require you to be unconscious, such as:  Cataract surgery.  A dental procedure.  A biopsy.  A colonoscopy.  During the procedure, you may receive a medicine to help you relax (sedative). There are three levels of sedation:  Mild sedation. At this level, you may feel awake and relaxed. You will be able to follow directions.  Moderate sedation. At this level, you will be sleepy. You may not remember the procedure.  Deep sedation. At this level,  you will be asleep. You will not remember the procedure.  The more medicine you are given, the deeper your level of sedation will be. Depending on how you respond to the procedure, the anesthesia specialist may change your level of sedation or the type of anesthesia to fit your needs. An anesthesia specialist will monitor you closely during the procedure. Let your health care provider know about:  Any allergies you have.  All medicines you are taking, including vitamins, herbs, eye drops,  creams, and over-the-counter medicines.  Any use of steroids (by mouth or as a cream).  Any problems you or family members have had with sedatives and anesthetic medicines.  Any blood disorders you have.  Any surgeries you have had.  Any medical conditions you have, such as sleep apnea.  Whether you are pregnant or may be pregnant.  Any use of cigarettes, alcohol, or street drugs. What are the risks? Generally, this is a safe procedure. However, problems may occur, including:  Getting too much medicine (oversedation).  Nausea.  Allergic reaction to medicines.  Trouble breathing. If this happens, a breathing tube may be used to help with breathing. It will be removed when you are awake and breathing on your own.  Heart trouble.  Lung trouble.  Before the procedure Staying hydrated Follow instructions from your health care provider about hydration, which may include:  Up to 2 hours before the procedure - you may continue to drink clear liquids, such as water, clear fruit juice, black coffee, and plain tea.  Eating and drinking restrictions Follow instructions from your health care provider about eating and drinking, which may include:  8 hours before the procedure - stop eating heavy meals or foods such as meat, fried foods, or fatty foods.  6 hours before the procedure - stop eating light meals or foods, such as toast or cereal.  6 hours before the procedure - stop drinking milk or drinks that contain milk.  2 hours before the procedure - stop drinking clear liquids.  Medicines Ask your health care provider about:  Changing or stopping your regular medicines. This is especially important if you are taking diabetes medicines or blood thinners.  Taking medicines such as aspirin and ibuprofen. These medicines can thin your blood. Do not take these medicines before your procedure if your health care provider instructs you not to.  Tests and exams  You will have a  physical exam.  You may have blood tests done to show: ? How well your kidneys and liver are working. ? How well your blood can clot.  General instructions  Plan to have someone take you home from the hospital or clinic.  If you will be going home right after the procedure, plan to have someone with you for 24 hours.  What happens during the procedure?  Your blood pressure, heart rate, breathing, level of pain and overall condition will be monitored.  An IV tube will be inserted into one of your veins.  Your anesthesia specialist will give you medicines as needed to keep you comfortable during the procedure. This may mean changing the level of sedation.  The procedure will be performed. After the procedure  Your blood pressure, heart rate, breathing rate, and blood oxygen level will be monitored until the medicines you were given have worn off.  Do not drive for 24 hours if you received a sedative.  You may: ? Feel sleepy, clumsy, or nauseous. ? Feel forgetful about what happened after the procedure. ?  Have a sore throat if you had a breathing tube during the procedure. ? Vomit. This information is not intended to replace advice given to you by your health care provider. Make sure you discuss any questions you have with your health care provider. Document Released: 06/25/2005 Document Revised: 03/07/2016 Document Reviewed: 01/20/2016 Elsevier Interactive Patient Education  2018 [employer].   Diverticulosis Diverticulosis is a condition that develops when small pouches (diverticula) form in the wall of the large intestine (colon). The colon is where water is absorbed and stool is formed. The pouches form when the inside layer of the colon pushes through weak spots in the outer layers of the colon. You may have a few pouches or many of them. What are the causes? The cause of this condition is not known. What increases the risk? The following factors may make you more likely  to develop this condition:  Being older than age 88. Your risk for this condition increases with age. Diverticulosis is rare among people younger than age 58. By age 8, many people have it.  Eating a low-fiber diet.  Having frequent constipation.  Being overweight.  Not getting enough exercise.  Smoking.  Taking over-the-counter pain medicines, like aspirin and ibuprofen.  Having a family history of diverticulosis.  What are the signs or symptoms? In most people, there are no symptoms of this condition. If you do have symptoms, they may include:  Bloating.  Cramps in the abdomen.  Constipation or diarrhea.  Pain in the lower left side of the abdomen.  How is this diagnosed? This condition is most often diagnosed during an exam for other colon problems. Because diverticulosis usually has no symptoms, it often cannot be diagnosed independently. This condition may be diagnosed by:  Using a flexible scope to examine the colon (colonoscopy).  Taking an X-ray of the colon after dye has been put into the colon (barium enema).  Doing a CT scan.  How is this treated? You may not need treatment for this condition if you have never developed an infection related to diverticulosis. If you have had an infection before, treatment may include:  Eating a high-fiber diet. This may include eating more fruits, vegetables, and grains.  Taking a fiber supplement.  Taking a live bacteria supplement (probiotic).  Taking medicine to relax your colon.  Taking antibiotic medicines.  Follow these instructions at home:  Drink 6-8 glasses of water or more each day to prevent constipation.  Try not to strain when you have a bowel movement.  If you have had an infection before: ? Eat more fiber as directed by your health care provider or your diet and nutrition specialist (dietitian). ? Take a fiber supplement or probiotic, if your health care provider approves.  Take over-the-counter  and prescription medicines only as told by your health care provider.  If you were prescribed an antibiotic, take it as told by your health care provider. Do not stop taking the antibiotic even if you start to feel better.  Keep all follow-up visits as told by your health care provider. This is important. Contact a health care provider if:  You have pain in your abdomen.  You have bloating.  You have cramps.  You have not had a bowel movement in 3 days. Get help right away if:  Your pain gets worse.  Your bloating becomes very bad.  You have a fever or chills, and your symptoms suddenly get worse.  You vomit.  You have bowel movements  that are bloody or black.  You have bleeding from your rectum. Summary  Diverticulosis is a condition that develops when small pouches (diverticula) form in the wall of the large intestine (colon).  You may have a few pouches or many of them.  This condition is most often diagnosed during an exam for other colon problems.  If you have had an infection related to diverticulosis, treatment may include increasing the fiber in your diet, taking supplements, or taking medicines. This information is not intended to replace advice given to you by your health care provider. Make sure you discuss any questions you have with your health care provider. Document Released: 06/26/2004 Document Revised: 08/18/2016 Document Reviewed: 08/18/2016 Elsevier Interactive Patient Education  2017 [employer].   Hemorrhoids Hemorrhoids are swollen veins in and around the rectum or anus. Hemorrhoids can cause pain, itching, or bleeding. Most of the time, they do not cause serious problems. They usually get better with diet changes, lifestyle changes, and other home treatments. Follow these instructions at home: Eating and drinking  Eat foods that have fiber, such as whole grains, beans, nuts, fruits, and vegetables. Ask your doctor about taking products that have  added fiber (fibersupplements).  Drink enough fluid to keep your pee (urine) clear or pale yellow. For Pain and Swelling  Take a warm-water bath (sitz bath) for 20 minutes to ease pain. Do this 3-4 times a day.  If directed, put ice on the painful area. It may be helpful to use ice between your warm baths. ? Put ice in a plastic bag. ? Place a towel between your skin and the bag. ? Leave the ice on for 20 minutes, 2-3 times a day. General instructions  Take over-the-counter and prescription medicines only as told by your doctor. ? Medicated creams and medicines that are inserted into the anus (suppositories) may be used or applied as told.  Exercise often.  Go to the bathroom when you have the urge to poop (to have a bowel movement). Do not wait.  Avoid pushing too hard (straining) when you poop.  Keep the butt area dry and clean. Use wet toilet paper or moist paper towels.  Do not sit on the toilet for a long time. Contact a doctor if:  You have any of these: ? Pain and swelling that do not get better with treatment or medicine. ? Bleeding that will not stop. ? Trouble pooping or you cannot poop. ? Pain or swelling outside the area of the hemorrhoids. This information is not intended to replace advice given to you by your health care provider. Make sure you discuss any questions you have with your health care provider. Document Released: 07/08/2008 Document Revised: 03/06/2016 Document Reviewed: 06/13/2015 Elsevier Interactive Patient Education  [employer].

## 2018-08-05 NOTE — Anesthesia Postprocedure Evaluation (Signed)
Anesthesia Post Note  Patient: Kimberly [name]  Procedure(s) Performed: COLONOSCOPY WITH PROPOFOL (N/A )  Patient location during evaluation: PACU Anesthesia Type: General Level of consciousness: awake and alert and oriented Pain management: pain level controlled Vital Signs Assessment: post-procedure vital signs reviewed and stable Respiratory status: spontaneous breathing Cardiovascular status: stable Postop Assessment: no apparent nausea or vomiting Anesthetic complications: no     Last Vitals:  Vitals:   08/05/18 1148  BP: (!) 136/92  Pulse: 76  Resp: 14  Temp: 36.7 C  SpO2: 94%    Last Pain:  Vitals:   08/05/18 1305  TempSrc:   PainSc: 0-No pain                 [name] A

## 2018-08-11 ENCOUNTER — Encounter (HOSPITAL_COMMUNITY): Payer: Self-pay | Admitting: Internal Medicine

## 2018-08-16 ENCOUNTER — Other Ambulatory Visit: Payer: Self-pay | Admitting: Internal Medicine

## 2018-08-16 DIAGNOSIS — F5104 Psychophysiologic insomnia: Secondary | ICD-10-CM

## 2018-08-16 MED FILL — ?FLUOXETINE HCL 20MG TABLET: 20 | 30 days supply | Qty: 30 | Fill #2

## 2018-08-16 MED FILL — ATENOLOL 25 MG TABLET: 25 | 30 days supply | Qty: 30 | Fill #10

## 2018-08-16 MED FILL — busPIRone HCL 15 MG TABS: 15 | 30 days supply | Qty: 60 | Fill #2

## 2018-08-26 ENCOUNTER — Ambulatory Visit
Admission: RE | Admit: 2018-08-26 | Discharge: 2018-08-26 | Disposition: A | Discharge status: Home / Self Care | Payer: No Typology Code available for payment source | Source: Ambulatory Visit | Attending: Obstetrics and Gynecology | Admitting: Obstetrics and Gynecology

## 2018-08-26 ENCOUNTER — Ambulatory Visit
Admission: RE | Admit: 2018-08-26 | Discharge: 2018-08-26 | Disposition: A | Discharge status: Home / Self Care | Payer: Self-pay | Source: Ambulatory Visit | Attending: Obstetrics and Gynecology | Admitting: Obstetrics and Gynecology

## 2018-08-26 ENCOUNTER — Ambulatory Visit (HOSPITAL_COMMUNITY)
Admission: RE | Admit: 2018-08-26 | Discharge: 2018-08-26 | Disposition: A | Discharge status: Home / Self Care | Payer: Self-pay | Source: Ambulatory Visit | Attending: Obstetrics and Gynecology | Admitting: Obstetrics and Gynecology

## 2018-08-26 ENCOUNTER — Encounter (HOSPITAL_COMMUNITY): Payer: Self-pay

## 2018-08-26 ENCOUNTER — Other Ambulatory Visit (HOSPITAL_COMMUNITY): Payer: Self-pay | Admitting: Obstetrics and Gynecology

## 2018-08-26 VITALS — BP 130/82 | Wt 164.0 lb

## 2018-08-26 DIAGNOSIS — N6320 Unspecified lump in the left breast, unspecified quadrant: Secondary | ICD-10-CM

## 2018-08-26 DIAGNOSIS — R921 Mammographic calcification found on diagnostic imaging of breast: Secondary | ICD-10-CM

## 2018-08-26 DIAGNOSIS — Z1239 Encounter for other screening for malignant neoplasm of breast: Secondary | ICD-10-CM

## 2018-08-26 NOTE — Progress Notes (Signed)
Patient referred to [facility] by the [facility] due to recommending a one year diagnostic mammogram and left breast ultrasound. Bilateral Breast diagnostic mammogram and left breast ultrasound completed 03/10/2017.   Pap Smear: Pap smear not completed today. Last Pap smear was 08/25/2014 at [facility] and normal with negative HPV. Per patient has no history of an abnormal Pap smear. Last Pap smear result is in EPIC.  Physical exam: Breasts Breasts symmetrical. No skin abnormalities bilateral breasts. No nipple retraction bilateral breasts. No nipple discharge bilateral breasts. No lymphadenopathy. No lumps palpated bilateral breasts. No complaints of pain or tenderness on exam. Referred patient to the [facility] for diagnostic mammogram and left breast ultrasound per recommendation. Appointment scheduled for Thursday, August 26, 2018 at 1450.     Pelvic/Bimanual No Pap smear completed today since last Pap smear and HPV typing was 08/25/2014. Pap smear not indicated per BCCCP guidelines.   Smoking History: Patient has never smoked.  Patient Navigation: Patient education provided. Access to services provided for patient through [place] program.    Colorectal Cancer Screening: Patient had a colonoscopy completed 08/05/2018. Patient completed a FIT Test 03/18/2018 that was positive. No complaints today.   Breast and Cervical Cancer Risk Assessment: Patient has a family history of her mother having breast cancer. Patient has no known genetic mutations or history of radiation treatment to the chest before age 38. Patient has no history of cervical dysplasia, immunocompromised, or DES exposure in-utero.  Risk Assessment    Risk Scores      08/26/2018   Last edited by: Armond Hang, LPN   5-year risk: 2 %   Lifetime risk: 7.7 %

## 2018-08-26 NOTE — Patient Instructions (Addendum)
Explained breast self awareness with [name]. Patient did not need a Pap smear today due to last Pap smear and HPV typing was 08/25/2014. Let her know BCCCP will cover Pap smears and HPV typing every 5 years unless has a history of abnormal Pap smears. Referred patient to the [facility] for diagnostic mammogram and left breast ultrasound per recommendation. Appointment scheduled for Thursday, August 26, 2018 at 1450. Patient aware of appointment and will be there. [name] verbalized understanding.  Hays Dunnigan, Arvil Chaco, RN 2:21 PM

## 2018-08-30 ENCOUNTER — Encounter: Payer: Self-pay | Admitting: Adult Health

## 2018-08-30 ENCOUNTER — Ambulatory Visit: Payer: Self-pay | Admitting: Adult Health

## 2018-09-01 ENCOUNTER — Ambulatory Visit
Admission: RE | Admit: 2018-09-01 | Discharge: 2018-09-01 | Disposition: A | Discharge status: Home / Self Care | Payer: No Typology Code available for payment source | Source: Ambulatory Visit | Attending: Obstetrics and Gynecology | Admitting: Obstetrics and Gynecology

## 2018-09-01 ENCOUNTER — Other Ambulatory Visit (HOSPITAL_COMMUNITY): Payer: Self-pay | Admitting: Obstetrics and Gynecology

## 2018-09-01 DIAGNOSIS — R921 Mammographic calcification found on diagnostic imaging of breast: Secondary | ICD-10-CM

## 2018-09-02 ENCOUNTER — Other Ambulatory Visit: Payer: Self-pay | Admitting: Obstetrics and Gynecology

## 2018-09-02 DIAGNOSIS — Z853 Personal history of malignant neoplasm of breast: Secondary | ICD-10-CM

## 2018-09-07 ENCOUNTER — Ambulatory Visit
Admission: RE | Admit: 2018-09-07 | Discharge: 2018-09-07 | Disposition: A | Discharge status: Home / Self Care | Payer: No Typology Code available for payment source | Source: Ambulatory Visit | Attending: Obstetrics and Gynecology | Admitting: Obstetrics and Gynecology

## 2018-09-07 ENCOUNTER — Telehealth: Payer: Self-pay | Admitting: Hematology and Oncology

## 2018-09-07 ENCOUNTER — Encounter: Payer: Self-pay | Admitting: *Deleted

## 2018-09-07 ENCOUNTER — Other Ambulatory Visit: Payer: Self-pay | Admitting: Obstetrics and Gynecology

## 2018-09-07 DIAGNOSIS — Z853 Personal history of malignant neoplasm of breast: Secondary | ICD-10-CM

## 2018-09-07 DIAGNOSIS — D0512 Intraductal carcinoma in situ of left breast: Secondary | ICD-10-CM

## 2018-09-07 NOTE — Telephone Encounter (Signed)
Spoke with patient to confirm morning [facility] appointment for 12/4, packet mailed to patient.

## 2018-09-08 ENCOUNTER — Encounter (HOSPITAL_COMMUNITY): Payer: Self-pay | Admitting: *Deleted

## 2018-09-14 NOTE — Progress Notes (Signed)
[place] NOTE  Patient Care Team: Ladell Pier, MD as PCP - General (Internal Medicine) Gala Romney, Cristopher Estimable, MD as Consulting Physician (Gastroenterology) Erroll Luna, MD as Consulting Physician (General Surgery) Nicholas Lose, MD as Consulting Physician (Hematology and Oncology) Kyung Rudd, MD as Consulting Physician (Radiation Oncology)  CHIEF COMPLAINTS/PURPOSE OF CONSULTATION:  Newly diagnosed breast cancer  HISTORY OF PRESENTING ILLNESS:  Kimberly [name] 62 y.o. 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 The cancer was discovered with a routine, yearly diagnostic mammogram on 08/26/18. The mammogram showed multiple, stable masses in the left breast considered benign and an 49mm group of microcalcifications in the lower central right breast and a group of indeterminate microcalcifications in the upper outer left breast spanning 10 x 10 x 1mm. A biopsy on 09/01/18 and 09/07/18 showed the right breast to be benign and the left breast to be DCIS, ER/PR positive.   She presents to the clinic today with her friend. She reports her mother had breast cancer and her grandmother had cancer, although she does not know what kind. When discussing side effects of anti-estrogen therapy, the patient noted she has hot flashes.   I reviewed her records extensively and collaborated the history with the patient.  REVIEW OF SYSTEMS:   Constitutional: Denies fevers, chills or abnormal night sweats (+) hot flashes Eyes: Denies blurriness of vision, double vision or watery eyes Ears, nose, mouth, throat, and face: Denies mucositis or sore throat Respiratory: Denies cough, dyspnea or wheezes Cardiovascular: Denies palpitation, chest discomfort or lower extremity swelling Gastrointestinal:  Denies nausea, heartburn or change in bowel habits Skin: Denies abnormal skin rashes Lymphatics: Denies new lymphadenopathy or easy bruising Neurological:Denies  numbness, tingling or new weaknesses Behavioral/Psych: Mood is stable, no new changes  Breast: Denies any palpable lumps or discharge All other systems were reviewed with the patient and are negative.  SUMMARY OF ONCOLOGIC HISTORY:   Ductal carcinoma in situ (DCIS) of left breast   09/07/2018 Initial Diagnosis    Bilateral masses and calcifications, right breast benign, left breast UOQ 3.2 cm biopsy revealed intermediate grade to high-grade DCIS ER 100%, PR 100%, 4 o'clock position retroareolar calcifications 2.2 cm biopsy revealed intermediate grade DCIS with [facility] ER 100%, PR 90%, both lesions are 5.5 cm apart, Tis NX stage 0      MEDICAL HISTORY:  Past Medical History:  Diagnosis Date  . Anxiety 1995  . Depression 1995  . History of kidney stones   . History of rheumatic fever as a child   . Hypertension Dx Dec 2015  . Panic attack 1995  . Tremor     SURGICAL HISTORY: Past Surgical History:  Procedure Laterality Date  . APPENDECTOMY    . bowel obstruction    . CHOLECYSTECTOMY    . COLONOSCOPY WITH PROPOFOL N/A 08/05/2018   Procedure: COLONOSCOPY WITH PROPOFOL;  Surgeon: Daneil Dolin, MD;  Location: [facility];  Service: Endoscopy;  Laterality: N/A;  12:45pm  . TONSILLECTOMY      SOCIAL HISTORY: Social History   Socioeconomic History  . Marital status: Single    Spouse name: Not on file  . Number of children: Not on file  . Years of education: Not on file  . Highest education level: Not on file  Occupational History  . Not on file  Social Needs  . Financial resource strain: Not on file  . Food insecurity:    Worry: Not on file  Inability: Not on file  . Transportation needs:    Medical: Not on file    Non-medical: Not on file  Tobacco Use  . Smoking status: Never Smoker  . Smokeless tobacco: Never Used  Substance and Sexual Activity  . Alcohol use: No    Alcohol/week: 0.0 standard drinks  . Drug use: No  . Sexual activity: Yes    Birth  control/protection: Post-menopausal  Lifestyle  . Physical activity:    Days per week: Not on file    Minutes per session: Not on file  . Stress: Not on file  Relationships  . Social connections:    Talks on phone: Not on file    Gets together: Not on file    Attends religious service: Not on file    Active member of club or organization: Not on file    Attends meetings of clubs or organizations: Not on file    Relationship status: Not on file  . Intimate partner violence:    Fear of current or ex partner: Not on file    Emotionally abused: Not on file    Physically abused: Not on file    Forced sexual activity: Not on file  Other Topics Concern  . Not on file  Social History Narrative  . Not on file    FAMILY HISTORY: Family History  Problem Relation Age of Onset  . Breast cancer Mother   . Cancer Maternal Grandmother        ?type  . Heart disease Paternal Grandfather   . Colon cancer Neg Hx     ALLERGIES:  is allergic to codeine.  MEDICATIONS:  Current Outpatient Medications  Medication Sig Dispense Refill  . atenolol (TENORMIN) 25 MG tablet Take 1 tablet (25 mg total) by mouth daily. 90 tablet 3  . busPIRone (BUSPAR) 15 MG tablet Take 1 tablet (15 mg total) by mouth 2 (two) times daily. 60 tablet 2  . FLUoxetine (PROZAC) 20 MG tablet TAKE 1 TABLET (20 MG TOTAL) BY MOUTH DAILY. 90 tablet 0  . ibuprofen (ADVIL,MOTRIN) 600 MG tablet TAKE 1 TABLET BY MOUTH EVERY 8 HOURS AS NEEDED. (Patient taking differently: Take 600 mg by mouth every 8 (eight) hours as needed for moderate pain. ) 30 tablet 0  . Multiple Vitamin (MULTIVITAMIN WITH MINERALS) TABS tablet Take 1 tablet by mouth daily.     . traZODone (DESYREL) 50 MG tablet Take 1 tablet (50 mg total) by mouth at bedtime as needed for sleep. MUST MAKE APPT FOR FURTHER REFILLS 30 tablet 0   No current facility-administered medications for this visit.     PHYSICAL EXAMINATION: ECOG PERFORMANCE STATUS: 0 -  Asymptomatic  Vitals:   09/15/18 0843  BP: 135/83  Pulse: 66  Resp: 16  Temp: 97.7 F (36.5 C)  SpO2: 98%   Filed Weights   09/15/18 0843  Weight: 164 lb 4.8 oz (74.5 kg)    GENERAL:alert, no distress and comfortable SKIN: skin color, texture, turgor are normal, no rashes or significant lesions EYES: normal, conjunctiva are pink and non-injected, sclera clear OROPHARYNX:no exudate, no erythema and lips, buccal mucosa, and tongue normal  NECK: supple, thyroid normal size, non-tender, without nodularity LYMPH:  no palpable lymphadenopathy in the cervical, axillary or inguinal LUNGS: clear to auscultation and percussion with normal breathing effort HEART: regular rate & rhythm and no murmurs and no lower extremity edema ABDOMEN:abdomen soft, non-tender and normal bowel sounds Musculoskeletal:no cyanosis of digits and no clubbing  PSYCH: alert &  oriented x 3 with fluent speech NEURO: no focal motor/sensory deficits  LABORATORY DATA:  I have reviewed the data as listed Lab Results  Component Value Date   WBC 6.0 09/15/2018   HGB 13.6 09/15/2018   HCT 41.4 09/15/2018   MCV 92.2 09/15/2018   PLT 423 (H) 09/15/2018   Lab Results  Component Value Date   NA 141 09/15/2018   K 4.8 09/15/2018   CL 109 09/15/2018   CO2 23 09/15/2018    RADIOGRAPHIC STUDIES: I have personally reviewed the radiological reports and agreed with the findings in the report.  ASSESSMENT AND PLAN:  Ductal carcinoma in situ (DCIS) of left breast 09/01/2018:Bilateral masses and calcifications, right breast benign, left breast UOQ 3.2 cm biopsy revealed intermediate grade to high-grade DCIS ER 100%, PR 100%, 4 o'clock position retroareolar calcifications 2.2 cm biopsy revealed intermediate grade DCIS with [facility] ER 100%, PR 90%, both lesions are 5.5 cm apart, Tis NX stage 0  Pathology review: I discussed with the patient the difference between DCIS and invasive breast cancer. It is considered a  precancerous lesion. DCIS is classified as a 0. It is generally detected through mammograms as calcifications. We discussed the significance of grades and its impact on prognosis. We also discussed the importance of ER and PR receptors and their implications to adjuvant treatment options. Prognosis of DCIS dependence on grade, comedo necrosis. It is anticipated that if not treated, 20-30% of DCIS can develop into invasive breast cancer.  Recommendation: 1. Breast conserving surgery 2. Followed by adjuvant radiation therapy 3. Followed by antiestrogen therapy with tamoxifen 5 years  Tamoxifen counseling: We discussed the risks and benefits of tamoxifen. These include but not limited to insomnia, hot flashes, mood changes, vaginal dryness, and weight gain. Although rare, serious side effects including endometrial cancer, risk of blood clots were also discussed. We strongly believe that the benefits far outweigh the risks. Patient understands these risks and consented to starting treatment. Planned treatment duration is 5 years.  Return to clinic after surgery to discuss the final pathology report and come up with an adjuvant treatment plan.     All questions were answered. The patient knows to call the clinic with any problems, questions or concerns.   Nicholas Lose, MD 09/15/2018   I, Molly Dorshimer, am acting as scribe for Nicholas Lose, MD.  I have reviewed the above documentation for accuracy and completeness, and I agree with the above.

## 2018-09-15 ENCOUNTER — Ambulatory Visit
Admission: RE | Admit: 2018-09-15 | Discharge: 2018-09-15 | Disposition: A | Discharge status: Home / Self Care | Payer: No Typology Code available for payment source | Source: Ambulatory Visit | Attending: Radiation Oncology | Admitting: Radiation Oncology

## 2018-09-15 ENCOUNTER — Inpatient Hospital Stay: Payer: Medicaid Other | Attending: Hematology and Oncology | Admitting: Hematology and Oncology

## 2018-09-15 ENCOUNTER — Ambulatory Visit: Payer: No Typology Code available for payment source | Admitting: Physical Therapy

## 2018-09-15 ENCOUNTER — Inpatient Hospital Stay: Payer: Medicaid Other

## 2018-09-15 ENCOUNTER — Ambulatory Visit: Payer: Self-pay | Admitting: Surgery

## 2018-09-15 ENCOUNTER — Encounter: Payer: Self-pay | Admitting: *Deleted

## 2018-09-15 DIAGNOSIS — I10 Essential (primary) hypertension: Secondary | ICD-10-CM | POA: Insufficient documentation

## 2018-09-15 DIAGNOSIS — Z79899 Other long term (current) drug therapy: Secondary | ICD-10-CM | POA: Insufficient documentation

## 2018-09-15 DIAGNOSIS — D0512 Intraductal carcinoma in situ of left breast: Secondary | ICD-10-CM

## 2018-09-15 DIAGNOSIS — Z801 Family history of malignant neoplasm of trachea, bronchus and lung: Secondary | ICD-10-CM

## 2018-09-15 DIAGNOSIS — Z170 Estrogen receptor positive status [ER+]: Secondary | ICD-10-CM | POA: Diagnosis not present

## 2018-09-15 DIAGNOSIS — D0511 Intraductal carcinoma in situ of right breast: Secondary | ICD-10-CM | POA: Diagnosis present

## 2018-09-15 DIAGNOSIS — Z803 Family history of malignant neoplasm of breast: Secondary | ICD-10-CM

## 2018-09-15 DIAGNOSIS — Z809 Family history of malignant neoplasm, unspecified: Secondary | ICD-10-CM

## 2018-09-15 LAB — CBC WITH DIFFERENTIAL (CANCER CENTER ONLY)
ABS IMMATURE GRANULOCYTES: 0.01 10*3/uL (ref 0.00–0.07)
Basophils Absolute: 0.1 10*3/uL (ref 0.0–0.1)
Basophils Relative: 1 %
Eosinophils Absolute: 0.3 10*3/uL (ref 0.0–0.5)
Eosinophils Relative: 5 %
HEMATOCRIT: 41.4 % (ref 36.0–46.0)
Hemoglobin: 13.6 g/dL (ref 12.0–15.0)
Immature Granulocytes: 0 %
LYMPHS ABS: 1.7 10*3/uL (ref 0.7–4.0)
Lymphocytes Relative: 28 %
MCH: 30.3 pg (ref 26.0–34.0)
MCHC: 32.9 g/dL (ref 30.0–36.0)
MCV: 92.2 fL (ref 80.0–100.0)
MONO ABS: 0.5 10*3/uL (ref 0.1–1.0)
MONOS PCT: 9 %
NEUTROS ABS: 3.4 10*3/uL (ref 1.7–7.7)
Neutrophils Relative %: 57 %
Platelet Count: 423 10*3/uL — ABNORMAL HIGH (ref 150–400)
RBC: 4.49 MIL/uL (ref 3.87–5.11)
RDW: 13.1 % (ref 11.5–15.5)
WBC Count: 6 10*3/uL (ref 4.0–10.5)
nRBC: 0 % (ref 0.0–0.2)

## 2018-09-15 LAB — CMP (CANCER CENTER ONLY)
ALBUMIN: 3.9 g/dL (ref 3.5–5.0)
ALK PHOS: 87 U/L (ref 38–126)
ALT: 11 U/L (ref 0–44)
AST: 16 U/L (ref 15–41)
Anion gap: 9 (ref 5–15)
BILIRUBIN TOTAL: 0.4 mg/dL (ref 0.3–1.2)
BUN: 15 mg/dL (ref 8–23)
CALCIUM: 9.8 mg/dL (ref 8.9–10.3)
CO2: 23 mmol/L (ref 22–32)
Chloride: 109 mmol/L (ref 98–111)
Creatinine: 0.92 mg/dL (ref 0.44–1.00)
GFR, Est AFR Am: >60 mL/min (ref >60)
GFR, Estimated: >60 mL/min (ref >60)
Glucose, Bld: 104 mg/dL — ABNORMAL HIGH (ref 70–99)
POTASSIUM: 4.8 mmol/L (ref 3.5–5.1)
Sodium: 141 mmol/L (ref 135–145)
Total Protein: 7.7 g/dL (ref 6.5–8.1)

## 2018-09-15 NOTE — H&P (Signed)
Kimberly [name] Documented: 09/15/2018 7:25 AM Location: [facility] Patient #: [id number] DOB: 06-28-1956 Undefined / Language: Kimberly [name] / Race: Black or African American Female  History of Present Illness Marcello Moores A. Charlsey Moragne MD; 09/15/2018 11:18 AM) Patient words: Pt sent at the request of Dr Lisbeth Renshaw for screen detected mammographic abnormality involving the left breast . She had an area in each breast biopsied with the right being benign. The left breast had 2 areas of calcifications 3 cm and 2 cm respectively. Bx showed DCIS high grade. No masses or nipple discharge bilaterally.  The patient is a 62 year old female.   Past Surgical History Tawni Pummel, RN; 09/15/2018 7:25 AM) Appendectomy Breast Biopsy Bilateral. Gallbladder Surgery - Laparoscopic Tonsillectomy  Diagnostic Studies History Tawni Pummel, RN; 09/15/2018 7:25 AM) Colonoscopy within last year Mammogram within last year Pap Smear 1-5 years ago  Medication History Tawni Pummel, RN; 09/15/2018 7:25 AM) Medications Reconciled  Social History Tawni Pummel, RN; 09/15/2018 7:25 AM) Alcohol use Remotely quit alcohol use. Caffeine use Carbonated beverages.  Family History Tawni Pummel, RN; 09/15/2018 7:25 AM) Breast Cancer Family Members In General, Mother. Respiratory Condition Family Members In General.  Pregnancy / Birth History Tawni Pummel, RN; 09/15/2018 7:25 AM) Age at menarche 71 years. Age of menopause 51-55 Contraceptive History Oral contraceptives. Gravida 4 Maternal age 105-25 Para 2 Regular periods  Other Problems Tawni Pummel, RN; 09/15/2018 7:25 AM) Anxiety Disorder Cholelithiasis Depression Heart murmur High blood pressure Kidney Stone     Review of Systems (Delane Stalling A. Elisa Sorlie MD; 09/15/2018 11:18 AM) Skin Not Present- Change in Wart/Mole, Dryness, Hives, Jaundice, New Lesions, Non-Healing Wounds, Rash and Ulcer. HEENT Present- Wears  glasses/contact lenses. Not Present- Earache, Hearing Loss, Hoarseness, Nose Bleed, Oral Ulcers, Ringing in the Ears, Seasonal Allergies, Sinus Pain, Sore Throat, Visual Disturbances and Yellow Eyes. Respiratory Not Present- Bloody sputum, Chronic Cough, Difficulty Breathing, Snoring and Wheezing. Breast Not Present- Breast Mass, Breast Pain, Nipple Discharge and Skin Changes. Cardiovascular Not Present- Chest Pain, Difficulty Breathing Lying Down, Leg Cramps, Palpitations, Rapid Heart Rate, Shortness of Breath and Swelling of Extremities. Gastrointestinal Not Present- Abdominal Pain, Bloating, Bloody Stool, Change in Bowel Habits, Chronic diarrhea, Constipation, Difficulty Swallowing, Excessive gas, Gets full quickly at meals, Hemorrhoids, Indigestion, Nausea, Rectal Pain and Vomiting. Female Genitourinary Not Present- Frequency, Nocturia, Painful Urination, Pelvic Pain and Urgency. Musculoskeletal Not Present- Back Pain, Joint Pain, Joint Stiffness, Muscle Pain, Muscle Weakness and Swelling of Extremities. Neurological Not Present- Decreased Memory, Fainting, Headaches, Numbness, Seizures, Tingling, Tremor, Trouble walking and Weakness. Psychiatric Not Present- Anxiety, Bipolar, Change in Sleep Pattern, Depression, Fearful and Frequent crying. Endocrine Not Present- Cold Intolerance, Excessive Hunger, Hair Changes, Heat Intolerance, Hot flashes and New Diabetes. Hematology Not Present- Blood Thinners, Easy Bruising, Excessive bleeding, Gland problems, HIV and Persistent Infections. All other systems negative   Physical Exam (Caralee Morea A. [place] MD; 09/15/2018 11:19 AM)  General Mental Status-Alert. General Appearance-Consistent with stated age. Hydration-Well hydrated. Voice-Normal.  Head and Neck Head-normocephalic, atraumatic with no lesions or palpable masses. Trachea-midline. Thyroid Gland Characteristics - normal size and consistency.  Eye Eyeball -  Bilateral-Extraocular movements intact. Sclera/Conjunctiva - Bilateral-No scleral icterus.  Chest and Lung Exam Chest and lung exam reveals -quiet, even and easy respiratory effort with no use of accessory muscles and on auscultation, normal breath sounds, no adventitious sounds and normal vocal resonance. Inspection Chest Wall - Normal. Back - normal.  Breast Breast - Left-Symmetric, Non Tender, No Biopsy scars, no Dimpling, No Inflammation, No Lumpectomy  scars, No Mastectomy scars, No Peau d' Orange. Breast - Right-Symmetric, Non Tender, No Biopsy scars, no Dimpling, No Inflammation, No Lumpectomy scars, No Mastectomy scars, No Peau d' Orange. Breast Lump-No Palpable Breast Mass.  Cardiovascular Cardiovascular examination reveals -normal heart sounds, regular rate and rhythm with no murmurs and normal pedal pulses bilaterally.  Neurologic Neurologic evaluation reveals -alert and oriented x 3 with no impairment of recent or remote memory. Mental Status-Normal.  Musculoskeletal Normal Exam - Left-Upper Extremity Strength Normal and Lower Extremity Strength Normal. Normal Exam - Right-Upper Extremity Strength Normal and Lower Extremity Strength Normal.  Lymphatic Head & Neck  General Head & Neck Lymphatics: Bilateral - Description - Normal. Axillary  General Axillary Region: Bilateral - Description - Normal. Tenderness - Non Tender.    Assessment & Plan (Donnetta Gillin A. Oluwaseyi Raffel MD; 09/15/2018 11:26 AM)  BREAST NEOPLASM, TIS (DCIS), LEFT (D05.12) Impression: pt opted for lumpectomy times two Risk of lumpectomy include bleeding, infection, seroma, more surgery, use of seed/wire, wound care, cosmetic deformity and the need for other treatments, death , blood clots, death. Pt agrees to proceed.  Current Plans You are being scheduled for surgery- Our schedulers will call you.  You should hear from our office's scheduling department within 5 working days about the  location, date, and time of surgery. We try to make accommodations for patient's preferences in scheduling surgery, but sometimes the OR schedule or the surgeon's schedule prevents [place] from making those accommodations.  If you have not heard from our office [phone]) in 5 working days, call the office and ask for your surgeon's nurse.  If you have other questions about your diagnosis, plan, or surgery, call the office and ask for your surgeon's nurse.  Pt Education - [facility] Breast Cancer Information Given - [facility] "Breast Journey" Package Pt Education - Pamphlet Given - Breast Biopsy: discussed with patient and provided information. We discussed the staging and pathophysiology of breast cancer. We discussed all of the different options for treatment for breast cancer including surgery, chemotherapy, radiation therapy, Herceptin, and antiestrogen therapy. We discussed a sentinel lymph node biopsy as she does not appear to having lymph node involvement right now. We discussed the performance of that with injection of radioactive tracer and blue dye. We discussed that she would have an incision underneath her axillary hairline. We discussed that there is a bout a 10-20% chance of having a positive node with a sentinel lymph node biopsy and we will await the permanent pathology to make any other first further decisions in terms of her treatment. One of these options might be to return to the operating room to perform an axillary lymph node dissection. We discussed about a 1-2% risk lifetime of chronic shoulder pain as well as lymphedema associated with a sentinel lymph node biopsy. We discussed the options for treatment of the breast cancer which included lumpectomy versus a mastectomy. We discussed the performance of the lumpectomy with a wire placement. We discussed a 10-20% chance of a positive margin requiring reexcision in the operating room. We also discussed that she may need radiation therapy or  antiestrogen therapy or both if she undergoes lumpectomy. We discussed the mastectomy and the postoperative care for that as well. We discussed that there is no difference in her survival whether she undergoes lumpectomy with radiation therapy or antiestrogen therapy versus a mastectomy. There is a slight difference in the local recurrence rate being 3-5% with lumpectomy and about 1% with a mastectomy. We discussed the risks of  operation including bleeding, infection, possible reoperation. She understands her further therapy will be based on what her stages at the time of her operation.  Pt Education - [facility] breast cancer surgery: discussed with patient and provided information. Pt Education - [facility] Breast Biopsy HCI: discussed with patient and provided information.

## 2018-09-15 NOTE — Progress Notes (Signed)
Clinical Social Work [place] Psychosocial Distress Screening [place]  Patient completed distress screening protocol and scored a 2 on the Psychosocial Distress Thermometer which indicates mild distress. Clinical Social Worker met with patient and patients friend  in [facility] to assess for distress and other psychosocial needs. Patient stated she was feeling overwhelmed but felt "better" after meeting with the treatment team and getting more information on her treatment plan. CSW and patient discussed common feeling and emotions when being diagnosed with cancer, and the importance of support during treatment. CSW informed patient of the support team and support services at [facility]. CSW provided contact information and encouraged patient to call with any questions or concerns.  ONCBCN DISTRESS SCREENING 09/15/2018  Distress experienced in past week (1-10) 2  Emotional problem type Nervousness/Anxiety;Adjusting to illness  Spiritual/Religous concerns type Relating to God     Johnnye Lana, MSW, LCSW, OSW-C Clinical Social Worker [facility] [phone]

## 2018-09-15 NOTE — H&P (View-Only) (Signed)
Kimberly [name] Documented: 09/15/2018 7:25 AM Location: [facility] Patient #: [id number] DOB: Apr 11, 1956 Undefined / Language: Kimberly [name] / Race: Black or African American Female  History of Present Illness Kimberly Moores A. Lovenia Debruler MD; 09/15/2018 11:18 AM) Patient words: Pt sent at the request of Dr Kimberly [name] for screen detected mammographic abnormality involving the left breast . She had an area in each breast biopsied with the right being benign. The left breast had 2 areas of calcifications 3 cm and 2 cm respectively. Bx showed DCIS high grade. No masses or nipple discharge bilaterally.  The patient is a 62 year old female.   Past Surgical History Kimberly Pummel, RN; 09/15/2018 7:25 AM) Appendectomy Breast Biopsy Bilateral. Gallbladder Surgery - Laparoscopic Tonsillectomy  Diagnostic Studies History Kimberly Pummel, RN; 09/15/2018 7:25 AM) Colonoscopy within last year Mammogram within last year Pap Smear 1-5 years ago  Medication History Kimberly Pummel, RN; 09/15/2018 7:25 AM) Medications Reconciled  Social History Kimberly Pummel, RN; 09/15/2018 7:25 AM) Alcohol use Remotely quit alcohol use. Caffeine use Carbonated beverages.  Family History Kimberly Pummel, RN; 09/15/2018 7:25 AM) Breast Cancer Family Members In General, Mother. Respiratory Condition Family Members In General.  Pregnancy / Birth History Kimberly Pummel, RN; 09/15/2018 7:25 AM) Age at menarche 58 years. Age of menopause 51-55 Contraceptive History Oral contraceptives. Gravida 4 Maternal age 12-25 Para 2 Regular periods  Other Problems Kimberly Pummel, RN; 09/15/2018 7:25 AM) Anxiety Disorder Cholelithiasis Depression Heart murmur High blood pressure Kidney Stone     Review of Systems (Kimberly Hallisey A. Aidyn Kellis MD; 09/15/2018 11:18 AM) Skin Not Present- Change in Wart/Mole, Dryness, Hives, Jaundice, New Lesions, Non-Healing Wounds, Rash and Ulcer. HEENT Present- Wears  glasses/contact lenses. Not Present- Earache, Hearing Loss, Hoarseness, Nose Bleed, Oral Ulcers, Ringing in the Ears, Seasonal Allergies, Sinus Pain, Sore Throat, Visual Disturbances and Yellow Eyes. Respiratory Not Present- Bloody sputum, Chronic Cough, Difficulty Breathing, Snoring and Wheezing. Breast Not Present- Breast Mass, Breast Pain, Nipple Discharge and Skin Changes. Cardiovascular Not Present- Chest Pain, Difficulty Breathing Lying Down, Leg Cramps, Palpitations, Rapid Heart Rate, Shortness of Breath and Swelling of Extremities. Gastrointestinal Not Present- Abdominal Pain, Bloating, Bloody Stool, Change in Bowel Habits, Chronic diarrhea, Constipation, Difficulty Swallowing, Excessive gas, Gets full quickly at meals, Hemorrhoids, Indigestion, Nausea, Rectal Pain and Vomiting. Female Genitourinary Not Present- Frequency, Nocturia, Painful Urination, Pelvic Pain and Urgency. Musculoskeletal Not Present- Back Pain, Joint Pain, Joint Stiffness, Muscle Pain, Muscle Weakness and Swelling of Extremities. Neurological Not Present- Decreased Memory, Fainting, Headaches, Numbness, Seizures, Tingling, Tremor, Trouble walking and Weakness. Psychiatric Not Present- Anxiety, Bipolar, Change in Sleep Pattern, Depression, Fearful and Frequent crying. Endocrine Not Present- Cold Intolerance, Excessive Hunger, Hair Changes, Heat Intolerance, Hot flashes and New Diabetes. Hematology Not Present- Blood Thinners, Easy Bruising, Excessive bleeding, Gland problems, HIV and Persistent Infections. All other systems negative   Physical Exam (Kimberly Hechavarria A. Ryo Klang MD; 09/15/2018 11:19 AM)  General Mental Status-Alert. General Appearance-Consistent with stated age. Hydration-Well hydrated. Voice-Normal.  Head and Neck Head-normocephalic, atraumatic with no lesions or palpable masses. Trachea-midline. Thyroid Gland Characteristics - normal size and consistency.  Eye Eyeball -  Bilateral-Extraocular movements intact. Sclera/Conjunctiva - Bilateral-No scleral icterus.  Chest and Lung Exam Chest and lung exam reveals -quiet, even and easy respiratory effort with no use of accessory muscles and on auscultation, normal breath sounds, no adventitious sounds and normal vocal resonance. Inspection Chest Wall - Normal. Back - normal.  Breast Breast - Left-Symmetric, Non Tender, No Biopsy scars, no Dimpling, No Inflammation, No Lumpectomy  scars, No Mastectomy scars, No Peau d' Orange. Breast - Right-Symmetric, Non Tender, No Biopsy scars, no Dimpling, No Inflammation, No Lumpectomy scars, No Mastectomy scars, No Peau d' Orange. Breast Lump-No Palpable Breast Mass.  Cardiovascular Cardiovascular examination reveals -normal heart sounds, regular rate and rhythm with no murmurs and normal pedal pulses bilaterally.  Neurologic Neurologic evaluation reveals -alert and oriented x 3 with no impairment of recent or remote memory. Mental Status-Normal.  Musculoskeletal Normal Exam - Left-Upper Extremity Strength Normal and Lower Extremity Strength Normal. Normal Exam - Right-Upper Extremity Strength Normal and Lower Extremity Strength Normal.  Lymphatic Head & Neck  General Head & Neck Lymphatics: Bilateral - Description - Normal. Axillary  General Axillary Region: Bilateral - Description - Normal. Tenderness - Non Tender.    Assessment & Plan (Kimberly Fanguy A. Amaris Garrette MD; 09/15/2018 11:26 AM)  BREAST NEOPLASM, TIS (DCIS), LEFT (D05.12) Impression: pt opted for lumpectomy times two Risk of lumpectomy include bleeding, infection, seroma, more surgery, use of seed/wire, wound care, cosmetic deformity and the need for other treatments, death , blood clots, death. Pt agrees to proceed.  Current Plans You are being scheduled for surgery- Our schedulers will call you.  You should hear from our office's scheduling department within 5 working days about the  location, date, and time of surgery. We try to make accommodations for patient's preferences in scheduling surgery, but sometimes the OR schedule or the surgeon's schedule prevents [place] from making those accommodations.  If you have not heard from our office [phone]) in 5 working days, call the office and ask for your surgeon's nurse.  If you have other questions about your diagnosis, plan, or surgery, call the office and ask for your surgeon's nurse.  Pt Education - [facility] Breast Cancer Information Given - [facility] "Breast Journey" Package Pt Education - Pamphlet Given - Breast Biopsy: discussed with patient and provided information. We discussed the staging and pathophysiology of breast cancer. We discussed all of the different options for treatment for breast cancer including surgery, chemotherapy, radiation therapy, Herceptin, and antiestrogen therapy. We discussed a sentinel lymph node biopsy as she does not appear to having lymph node involvement right now. We discussed the performance of that with injection of radioactive tracer and blue dye. We discussed that she would have an incision underneath her axillary hairline. We discussed that there is a bout a 10-20% chance of having a positive node with a sentinel lymph node biopsy and we will await the permanent pathology to make any other first further decisions in terms of her treatment. One of these options might be to return to the operating room to perform an axillary lymph node dissection. We discussed about a 1-2% risk lifetime of chronic shoulder pain as well as lymphedema associated with a sentinel lymph node biopsy. We discussed the options for treatment of the breast cancer which included lumpectomy versus a mastectomy. We discussed the performance of the lumpectomy with a wire placement. We discussed a 10-20% chance of a positive margin requiring reexcision in the operating room. We also discussed that she may need radiation therapy or  antiestrogen therapy or both if she undergoes lumpectomy. We discussed the mastectomy and the postoperative care for that as well. We discussed that there is no difference in her survival whether she undergoes lumpectomy with radiation therapy or antiestrogen therapy versus a mastectomy. There is a slight difference in the local recurrence rate being 3-5% with lumpectomy and about 1% with a mastectomy. We discussed the risks of  operation including bleeding, infection, possible reoperation. She understands her further therapy will be based on what her stages at the time of her operation.  Pt Education - [facility] breast cancer surgery: discussed with patient and provided information. Pt Education - [facility] Breast Biopsy HCI: discussed with patient and provided information.

## 2018-09-15 NOTE — Assessment & Plan Note (Signed)
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 It is considered a precancerous lesion. DCIS is classified as a 0. It is generally detected through mammograms as calcifications. We discussed the significance of grades and its impact on prognosis. We also discussed the importance of ER and PR receptors and their implications to adjuvant treatment options. Prognosis of DCIS dependence on grade, comedo necrosis. It is anticipated that if not treated, 20-30% of DCIS can develop into invasive breast cancer.  Recommendation: 1. Breast conserving surgery 2. Followed by adjuvant radiation therapy 3. Followed by antiestrogen therapy with tamoxifen 5 years  Tamoxifen counseling: We discussed the risks and benefits of tamoxifen. These include but not limited to insomnia, hot flashes, mood changes, vaginal dryness, and weight gain. Although rare, serious side effects including endometrial cancer, risk of blood clots were also discussed. We strongly believe that the benefits far outweigh the risks. Patient understands these risks and consented to starting treatment. Planned treatment duration is 5 years.  Return to clinic after surgery to discuss the final pathology report and come up with an adjuvant treatment plan.

## 2018-09-15 NOTE — Progress Notes (Signed)
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   HISTORY OF PRESENT ILLNESS: Kimberly [name] is a 62 y.o. female seen in the multidisciplinary breast clinic for a new diagnosis of left breast cancer. The patient was noted to have bilateral masses and calcifications on a diagnostic mammogram as she's been followed for benign appearing breast masses in the left breast. She had some features in the right breast there was an area of calcification measuring 2 x 6 x 8 mm in the lower central breast. In the left, there were stable masses at 10:00 11:00, 1:30, and 4:00. She had an 8 mm group of indeterminate microcalcifications in the upper outer left breast measuring 10 x 10 x 32 mm, and at 4:00 in the retroareolar region there was a group of calcifications measuring 2.2 x 1.4 x 1 cm as well. She underwent ultrasound that did not identify adenopathy. She underwent stereotactic biopsy on 09/01/18 of the UOQ calcifications that revealed an intermediate to high grade DCIS, ER/PR positive, and at the 4:00 retroareolar site, and intermediate grade DCIS with [facility], also ER/PR positive. She comes today to discuss options of treatment for her cancer.    PREVIOUS RADIATION THERAPY: No   PAST MEDICAL HISTORY:  Past Medical History:  Diagnosis Date  . Anxiety 1995  . Depression 1995  . History of kidney stones   . History of rheumatic fever as a child   . Hypertension Dx Dec 2015  . Panic attack 1995  . Tremor        PAST SURGICAL HISTORY: Past Surgical History:  Procedure Laterality Date  . APPENDECTOMY    . bowel obstruction    . CHOLECYSTECTOMY    . COLONOSCOPY WITH PROPOFOL N/A 08/05/2018   Procedure: COLONOSCOPY WITH PROPOFOL;  Surgeon: Daneil Dolin, MD;  Location: [facility];  Service: Endoscopy;  Laterality: N/A;  12:45pm  . TONSILLECTOMY       FAMILY HISTORY:  Family History  Problem Relation Age of Onset  . Breast cancer Mother   . Cancer Maternal Grandmother        ?type  . Heart disease Paternal Grandfather   . Colon cancer Neg Hx      SOCIAL HISTORY:  reports that she has never smoked. She has never used smokeless tobacco. She reports that she does not drink alcohol or use drugs. She lives in [place] and looks after her 5 grandchildren.   ALLERGIES: Codeine   MEDICATIONS:  Current Outpatient Medications  Medication Sig Dispense Refill  . atenolol (TENORMIN) 25 MG tablet Take 1 tablet (25 mg total) by mouth daily. 90 tablet 3  . busPIRone (BUSPAR) 15 MG tablet Take 1 tablet (15 mg total) by mouth 2 (two) times daily. 60 tablet 2  . FLUoxetine (PROZAC) 20 MG tablet TAKE 1 TABLET (20 MG TOTAL) BY MOUTH DAILY. 90 tablet 0  . ibuprofen (ADVIL,MOTRIN) 600 MG tablet TAKE 1 TABLET BY MOUTH EVERY 8 HOURS AS NEEDED. (Patient taking differently: Take 600 mg by mouth every 8 (eight) hours as needed for moderate pain. ) 30 tablet 0  . Multiple Vitamin (MULTIVITAMIN WITH MINERALS) TABS tablet Take 1 tablet by  mouth daily.     . traZODone (DESYREL) 50 MG tablet Take 1 tablet (50 mg total) by mouth at bedtime as needed for sleep. MUST MAKE APPT FOR FURTHER REFILLS 30 tablet 0   No current facility-administered medications for this encounter.      REVIEW OF SYSTEMS: On review of systems, the patient reports that she is doing well overall. She denies any chest pain, shortness of breath, cough, fevers, chills, night sweats, unintended weight changes. She denies any bowel or bladder disturbances, and denies abdominal pain, nausea or vomiting. She denies any new musculoskeletal or joint aches or pains. A complete review of systems is obtained and is otherwise  negative.     PHYSICAL EXAM:  Wt Readings from Last 3 Encounters:  09/15/18 164 lb 4.8 oz (74.5 kg)  08/26/18 164 lb (74.4 kg)  08/05/18 166 lb (75.3 kg)   Temp Readings from Last 3 Encounters:  09/15/18 97.7 F (36.5 C) (Oral)  08/05/18 98.2 F (36.8 C) (Oral)  08/03/18 98.9 F (37.2 C) (Oral)   BP Readings from Last 3 Encounters:  09/15/18 135/83  08/26/18 130/82  08/05/18 (!) 144/88   Pulse Readings from Last 3 Encounters:  09/15/18 66  08/05/18 78  08/03/18 84     In general this is a well appearing African American female in no acute distress. She is alert and oriented x4 and appropriate throughout the examination. HEENT reveals that the patient is normocephalic, atraumatic. EOMs are intact.  Skin is intact without any evidence of gross lesions. Cardiovascular exam reveals a regular rate and rhythm, no clicks rubs or murmurs are auscultated. Chest is clear to auscultation bilaterally. Lymphatic assessment is performed and does not reveal any adenopathy in the cervical, supraclavicular, axillary, or inguinal chains. Bilateral breast exam is performed and reveals mild fullness at her biopsy sites in the left breast. No mass is noted in the right, and no nipple bleeding or discharge is noted of either breast. Abdomen has active bowel sounds in all quadrants and is intact. The abdomen is soft, non tender, non distended. Lower extremities are negative for pretibial pitting edema, deep calf tenderness, cyanosis or clubbing.   ECOG = 0  0 - Asymptomatic (Fully active, able to carry on all predisease activities without restriction)  1 - Symptomatic but completely ambulatory (Restricted in physically strenuous activity but ambulatory and able to carry out work of a light or sedentary nature. For example, light housework, office work)  2 - Symptomatic, <50% in bed during the day (Ambulatory and capable of all self care but unable to carry out any work activities. Up and about more  than 50% of waking hours)  3 - Symptomatic, >50% in bed, but not bedbound (Capable of only limited self-care, confined to bed or chair 50% or more of waking hours)  4 - Bedbound (Completely disabled. Cannot carry on any self-care. Totally confined to bed or chair)  5 - Death   Eustace Pen MM, Creech RH, Tormey DC, et al. [phone]). "Toxicity and response criteria of the [facility]". [place] Oncol. 5 (6): 649-55    LABORATORY DATA:  Lab Results  Component Value Date   WBC 6.0 09/15/2018   HGB 13.6 09/15/2018   HCT 41.4 09/15/2018   MCV 92.2 09/15/2018   PLT 423 (H) 09/15/2018   Lab Results  Component Value Date   NA 141 09/15/2018   K 4.8 09/15/2018   CL 109 09/15/2018   CO2 23 09/15/2018  Lab Results  Component Value Date   ALT 11 09/15/2018   AST 16 09/15/2018   ALKPHOS 87 09/15/2018   BILITOT 0.4 09/15/2018      RADIOGRAPHY: Mm Breast Surgical Specimen  Result Date: 09/07/2018 CLINICAL DATA:  Specimen radiograph of samples from an ultrasound-guided biopsy. EXAM: SPECIMEN RADIOGRAPH OF THE LEFT BREAST COMPARISON:  Previous exam(s). FINDINGS: Status post ultrasound-guided biopsy of the left breast. Calcifications were seen in each of the 2 samples. IMPRESSION: Specimen radiograph of the left breast. Electronically Signed   By: Ammie Ferrier M.D.   On: 09/07/2018 15:44   US Breast Ltd Uni Left Inc Axilla  Result Date: 08/26/2018 CLINICAL DATA:  Patient presents for a bilateral diagnostic examination to follow-up several probable benign left breast masses. Patient is due for her annual bilateral mammogram. EXAM: DIGITAL DIAGNOSTIC bilateral MAMMOGRAM WITH CAD AND TOMO ULTRASOUND left BREAST COMPARISON:  Previous exam(s). ACR Breast Density Category b: There are scattered areas of fibroglandular density. FINDINGS: Examination demonstrates multiple small oval circumscribed masses within the left breast which are unchanged from 2017. One of these masses  contains multiple coarse microcalcifications over the retroareolar region as this is unchanged. There is a group of punctate microcalcifications in somewhat linear distribution over the upper outer left breast spanning approximately 10 x 10 x 32 mm. Some of the more anterior calcifications may be vascular. There are additional round/punctate microcalcifications over the outer left periareolar region which are very superficial and some may be dermal in origin and not amenable to stereotactic biopsy. There is a group of coarse heterogeneous microcalcifications and linear distribution over the posterior third of the lower central right breast spanning 2 x 6 x 8 mm. Mammographic images were processed with CAD. Targeted ultrasound of the left breast is performed, showing stable oval circumscribed hypoechoic mass 10 o'clock 4 cm from the nipple measuring 3 x 7 x 8 mm. Stable oval hypoechoic circumscribed mass at the 11 o'clock position left breast 7 cm from nipple measuring 4 x 7 x 10 mm. Stable group of normal lymph nodes over the 1:30 position left breast 5 cm from nipple measuring 1.6 cm in length unchanged. Three adjacent stable small oval hypoechoic circumscribed masses at the 1:30 position of the left breast 4 cm from the nipple measuring 1 x 4 x 4 mm, 1 x 2 x 3 mm and 2 x 3 x 3 mm. Stable oval circumscribed isoechoic mass with multiple calcifications at the 4 o'clock position of the retroareolar region measuring 2 x 4 x 4 mm. IMPRESSION: 1. Multiple stable left breast masses as described which have been stable for 2 years and therefore considered benign. 2. 8 mm group of indeterminate microcalcifications over the lower central right breast and group of indeterminate microcalcifications over the upper outer left breast spanning 10 x 10 x 32 mm. RECOMMENDATION: Recommend stereotactic core needle biopsy of the above described indeterminate microcalcifications bilaterally. If benign, would recommend six-month follow-up  diagnostic left breast mammogram with Mag views to document stability of the superficial left outer periareolar microcalcifications. If left breasts biopsy malignant, patient may require biopsy of these additional superficial left breast microcalcifications. I have discussed the findings and recommendations with the patient. Results were also provided in writing at the conclusion of the visit. If applicable, a reminder letter will be sent to the patient regarding the next appointment. BI-RADS CATEGORY  4: Suspicious. Biopsy will be scheduled here at the [facility] prior to patient's departure. Electronically Signed  By: Marin Olp M.D.   On: 08/26/2018 16:43   Mm Diag Breast Tomo Bilateral  Result Date: 08/26/2018 CLINICAL DATA:  Patient presents for a bilateral diagnostic examination to follow-up several probable benign left breast masses. Patient is due for her annual bilateral mammogram. EXAM: DIGITAL DIAGNOSTIC bilateral MAMMOGRAM WITH CAD AND TOMO ULTRASOUND left BREAST COMPARISON:  Previous exam(s). ACR Breast Density Category b: There are scattered areas of fibroglandular density. FINDINGS: Examination demonstrates multiple small oval circumscribed masses within the left breast which are unchanged from 2017. One of these masses contains multiple coarse microcalcifications over the retroareolar region as this is unchanged. There is a group of punctate microcalcifications in somewhat linear distribution over the upper outer left breast spanning approximately 10 x 10 x 32 mm. Some of the more anterior calcifications may be vascular. There are additional round/punctate microcalcifications over the outer left periareolar region which are very superficial and some may be dermal in origin and not amenable to stereotactic biopsy. There is a group of coarse heterogeneous microcalcifications and linear distribution over the posterior third of the lower central right breast spanning 2 x 6 x 8  mm. Mammographic images were processed with CAD. Targeted ultrasound of the left breast is performed, showing stable oval circumscribed hypoechoic mass 10 o'clock 4 cm from the nipple measuring 3 x 7 x 8 mm. Stable oval hypoechoic circumscribed mass at the 11 o'clock position left breast 7 cm from nipple measuring 4 x 7 x 10 mm. Stable group of normal lymph nodes over the 1:30 position left breast 5 cm from nipple measuring 1.6 cm in length unchanged. Three adjacent stable small oval hypoechoic circumscribed masses at the 1:30 position of the left breast 4 cm from the nipple measuring 1 x 4 x 4 mm, 1 x 2 x 3 mm and 2 x 3 x 3 mm. Stable oval circumscribed isoechoic mass with multiple calcifications at the 4 o'clock position of the retroareolar region measuring 2 x 4 x 4 mm. IMPRESSION: 1. Multiple stable left breast masses as described which have been stable for 2 years and therefore considered benign. 2. 8 mm group of indeterminate microcalcifications over the lower central right breast and group of indeterminate microcalcifications over the upper outer left breast spanning 10 x 10 x 32 mm. RECOMMENDATION: Recommend stereotactic core needle biopsy of the above described indeterminate microcalcifications bilaterally. If benign, would recommend six-month follow-up diagnostic left breast mammogram with Mag views to document stability of the superficial left outer periareolar microcalcifications. If left breasts biopsy malignant, patient may require biopsy of these additional superficial left breast microcalcifications. I have discussed the findings and recommendations with the patient. Results were also provided in writing at the conclusion of the visit. If applicable, a reminder letter will be sent to the patient regarding the next appointment. BI-RADS CATEGORY  4: Suspicious. Biopsy will be scheduled here at the [place] prior to patient's departure. Electronically Signed   By: Marin Olp M.D.    On: 08/26/2018 16:43   Mm Clip Placement Left  Result Date: 09/07/2018 CLINICAL DATA:  Post biopsy mammogram of the left breast for clip placement. EXAM: DIAGNOSTIC LEFT MAMMOGRAM POST ultrasound BIOPSY COMPARISON:  Previous exam(s). FINDINGS: Mammographic images were obtained following ultrasound guided biopsy of calcifications in the lower outer retroareolar left breast. The ribbon shaped biopsy marking clip is well positioned at the site of the biopsied calcifications in the retroareolar lower outer left breast. IMPRESSION: Appropriate positioning of the ribbon shaped  biopsy marking clip in the retroareolar lower outer left breast. Final Assessment: Post Procedure Mammograms for Marker Placement Electronically Signed   By: Ammie Ferrier M.D.   On: 09/07/2018 15:42   Mm Clip Placement Left  Result Date: 09/01/2018 CLINICAL DATA:  Bilateral stereotactic biopsies were performed of bilateral breast calcifications. EXAM: DIAGNOSTIC BILATERAL MAMMOGRAM POST STEREOTACTIC BIOPSY COMPARISON:  Previous exam(s). FINDINGS: Mammographic images were obtained following stereotactic guided biopsy of calcifications in the posterior third of the lower central right breast. Coil shaped biopsy clip is satisfactorily positioned in the right breast. Mammographic images were obtained following stereotactic biopsy of calcifications in the outer and slightly upper left breast, middle third. Coil shaped biopsy clip is satisfactorily positioned in the left breast. IMPRESSION: Satisfactory position of bilateral coil shaped biopsy clips. Final Assessment: Post Procedure Mammograms for Marker Placement Electronically Signed   By: Curlene Dolphin M.D.   On: 09/01/2018 12:25   Mm Clip Placement Right  Result Date: 09/01/2018 CLINICAL DATA:  Bilateral stereotactic biopsies were performed of bilateral breast calcifications. EXAM: DIAGNOSTIC BILATERAL MAMMOGRAM POST STEREOTACTIC BIOPSY COMPARISON:  Previous exam(s). FINDINGS:  Mammographic images were obtained following stereotactic guided biopsy of calcifications in the posterior third of the lower central right breast. Coil shaped biopsy clip is satisfactorily positioned in the right breast. Mammographic images were obtained following stereotactic biopsy of calcifications in the outer and slightly upper left breast, middle third. Coil shaped biopsy clip is satisfactorily positioned in the left breast. IMPRESSION: Satisfactory position of bilateral coil shaped biopsy clips. Final Assessment: Post Procedure Mammograms for Marker Placement Electronically Signed   By: Curlene Dolphin M.D.   On: 09/01/2018 12:25   Mm Lt Breast Bx W Loc Dev 1st Lesion Image Bx Spec Stereo Guide  Addendum Date: 09/03/2018   ADDENDUM REPORT: 09/03/2018 07:55 ADDENDUM: Pathology revealed BENIGN BREAST TISSUE WITH FIBROCYSTIC CHANGE, INCLUDING FIBROADENOMATOID CHANGE AND DYSTROPHIC CALCIFICATIONS of the RIGHT breast, inferior central/slightly outer. This was found to be concordant by Dr. Curlene Dolphin. Pathology revealed FOCI OF DUCTAL CARCINOMA IN SITU, INTERMEDIATE TO HIGH NUCLEAR GRADE, WITH CALCIFICATIONS, BACKGROUND BREAST TISSUE WITH FIBROADENOMATOID CHANGE of the LEFT breast, slightly upper and outer, middle third. This was found to be concordant by Dr. Curlene Dolphin. Pathology results were discussed with the patient by telephone. The patient reported doing well after the biopsies with tenderness at the sites. Post biopsy instructions and care were reviewed and questions were answered. The patient was encouraged to call The [facility] for any additional concerns. The patient was referred to [facility] on September 15, 2018. The patient is scheduled for a LEFT breast ultrasound guided biopsy on September 07, 2018. Pathology results reported by Terie Purser, RN on 09/03/2018. Electronically Signed   By: Curlene Dolphin M.D.   On: 09/03/2018 07:55   Result Date: 09/03/2018 CLINICAL DATA:  Stereotactic biopsy was recommended of calcifications in the outer left breast, just slightly superior to the level of the nipple. EXAM: LEFT BREAST STEREOTACTIC CORE NEEDLE BIOPSY COMPARISON:  Previous exams. FINDINGS: The patient and I discussed the procedure of stereotactic-guided biopsy including benefits and alternatives. We discussed the high likelihood of a successful procedure. We discussed the risks of the procedure including infection, bleeding, tissue injury, clip migration, and inadequate sampling. Informed written consent was given. The usual time out protocol was performed immediately prior to the procedure. Using sterile technique and 1% Lidocaine as local anesthetic, under  stereotactic guidance, a 9 gauge vacuum assisted device was used to perform core needle biopsy of calcifications in the outer and slightly upper left breast using a superior approach. Specimen radiograph was performed showing calcifications. Specimens with calcifications are identified for pathology. Lesion quadrant: Upper outer quadrant At the conclusion of the procedure, a coil tissue marker clip was deployed into the biopsy cavity. Follow-up 2-view mammogram was performed and dictated separately. IMPRESSION: Stereotactic-guided biopsy of the left breast. No apparent complications. Electronically Signed: By: Curlene Dolphin M.D. On: 09/01/2018 12:24   [place] Lt Breast Bx W Loc Dev 1st Lesion Img Bx Spec US Guide  Addendum Date: 09/08/2018   ADDENDUM REPORT: 09/08/2018 12:20 ADDENDUM: Pathology revealed INTERMEDIATE GRADE DUCTAL CARCINOMA IN SITU WITH CALCIFICATIONS, LOBULAR NEOPLASIA (ATYPICAL LOBULAR HYPERPLASIA) of the Left breast, retroareolar 4 o'clock. This was found to be concordant by Dr. Ammie Ferrier. Pathology results were discussed with the patient by telephone. The patient reported doing well after the biopsy with tenderness at the site.  Post biopsy instructions and care were reviewed and questions were answered. The patient was encouraged to call The [facility] for any additional concerns. The patient has a recent diagnosis of left breast cancer and should follow her outlined treatment plan. The patient was referred to [facility] on September 15, 2018. Pathology results reported by Terie Purser, RN on 09/08/2018. Electronically Signed   By: Ammie Ferrier M.D.   On: 09/08/2018 12:20   Result Date: 09/08/2018 CLINICAL DATA:  Ultrasound-guided biopsy of the left breast EXAM: ULTRASOUND GUIDED LEFT BREAST CORE NEEDLE BIOPSY COMPARISON:  Previous exam(s). FINDINGS: I met with the patient and we discussed the procedure of ultrasound-guided biopsy, including benefits and alternatives. We discussed the high likelihood of a successful procedure. We discussed the risks of the procedure, including infection, bleeding, tissue injury, clip migration, and inadequate sampling. Informed written consent was given. The usual time-out protocol was performed immediately prior to the procedure. Lesion quadrant: Lower outer quadrant Using sterile technique and 1% Lidocaine as local anesthetic, under direct ultrasound visualization, a 14 gauge spring-loaded device was used to perform biopsy of calcifications in the left breast at 4 o'clock using an inferior approach. At the conclusion of the procedure a ribbon shaped tissue marker clip was deployed into the biopsy cavity. Follow up 2 view mammogram was performed and dictated separately. IMPRESSION: Ultrasound guided biopsy of calcifications in the lower outer retroareolar left breast. No apparent complications. Electronically Signed: By: Ammie Ferrier M.D. On: 09/07/2018 15:44   Mm Rt Breast Bx W Loc Dev 1st Lesion Image Bx Spec Stereo Guide  Addendum Date: 09/03/2018   ADDENDUM REPORT: 09/03/2018 08:03  ADDENDUM: Pathology revealed BENIGN BREAST TISSUE WITH FIBROCYSTIC CHANGE, INCLUDING FIBROADENOMATOID CHANGE AND DYSTROPHIC CALCIFICATIONS of the RIGHT breast, inferior central/slightly outer. This was found to be concordant by Dr. Curlene Dolphin. Pathology revealed FOCI OF DUCTAL CARCINOMA IN SITU, INTERMEDIATE TO HIGH NUCLEAR GRADE, WITH CALCIFICATIONS, BACKGROUND BREAST TISSUE WITH FIBROADENOMATOID CHANGE of the LEFT breast, slightly upper and outer, middle third. This was found to be concordant by Dr. Curlene Dolphin. Pathology results were discussed with the patient by telephone. The patient reported doing well after the biopsies with tenderness at the sites. Post biopsy instructions and care were reviewed and questions were answered. The patient was encouraged to call [facility] for any additional concerns. The patient was [facility] to [facility] on September 15, 2018. The patient is scheduled for a LEFT breast ultrasound guided biopsy on September 07, 2018. Pathology results reported by Terie Purser, RN on 09/03/2018. Electronically Signed   By: Curlene Dolphin M.D.   On: 09/03/2018 08:03   Result Date: 09/03/2018 CLINICAL DATA:  Stereotactic biopsy was recommended calcifications in the deep inferior central right breast. EXAM: RIGHT BREAST STEREOTACTIC CORE NEEDLE BIOPSY COMPARISON:  Previous exams. FINDINGS: The patient and I discussed the procedure of stereotactic-guided biopsy including benefits and alternatives. We discussed the high likelihood of a successful procedure. We discussed the risks of the procedure including infection, bleeding, tissue injury, clip migration, and inadequate sampling. Informed written consent was given. The usual time out protocol was performed immediately prior to the procedure. Using sterile technique and 1% Lidocaine as local anesthetic, under stereotactic guidance, a 9 gauge  vacuum assisted device was used to perform core needle biopsy of calcifications in the posterior third of the inferior central/slightly outer using a lateral approach. Specimen radiograph was performed showing calcifications. Specimens with calcifications are identified for pathology. Lesion quadrant: Lower outer quadrant At the conclusion of the procedure, a coil tissue marker clip was deployed into the biopsy cavity. Follow-up 2-view mammogram was performed and dictated separately. IMPRESSION: Stereotactic-guided biopsy of the right breast. No apparent complications. Electronically Signed: By: Curlene Dolphin M.D. On: 09/01/2018 12:20       IMPRESSION/PLAN: 1. Multifocal ER/PR positive intermediate to high grade DCIS of the left breast. Dr. Lisbeth Renshaw discusses the pathology findings and reviews the nature of noninvasive breast disease. The consensus from the breast conference includes breast conservation with lumpectomy x2. She would be benefited by external radiotherapy to the breast followed by antiestrogen therapy. We discussed the risks, benefits, short, and long term effects of radiotherapy, and the patient is interested in proceeding. Dr. Lisbeth Renshaw discusses the delivery and logistics of radiotherapy and anticpates a course of 4 weeks of radiotherapy. We will see her back about 2 weeks after surgery to discuss the simulation process and anticipate we starting radiotherapy about 4-6 weeks after surgery.  2. Possible genetic predisposition to malignancy. The patient is a candidate for genetic testing given her personal and family history. She was offered referral and is interested and will be set up for formal referral.   The above documentation reflects my direct findings during this shared patient visit. Please see the separate note by Dr. Lisbeth Renshaw on this date for the remainder of the patient's plan of care.    Carola Rhine, PAC

## 2018-09-15 NOTE — Progress Notes (Signed)
Nutrition Assessment  Reason for Assessment:  Pt seen in Breast Clinic  ASSESSMENT:  62 year old female with new diagnosis of breast cancer.  Past medical history reviewed.  Patient reports normal appetite.  Medications:  reviewed  Labs: reviewed  Anthropometrics:   Height: 65 inches Weight: 164 lb 4.8 oz BMI: 27   NUTRITION DIAGNOSIS: Food and nutrition related knowledge deficit related to new diagnosis of breast cancer as evidenced by no prior need for nutrition related information.  INTERVENTION:   Discussed and provided packet of information regarding nutritional tips for breast cancer patients.  Questions answered.  Teachback method used.  Contact information provided and patient knows to contact me with questions/concerns.    MONITORING, EVALUATION, and GOAL: Pt will consume a healthy plant based diet to maintain lean body mass throughout treatment.   Keirstan Iannello B. Zenia Resides, [place], [place] Registered Dietitian [phone] (pager)

## 2018-09-16 ENCOUNTER — Telehealth: Payer: Self-pay | Admitting: Hematology and Oncology

## 2018-09-16 MED FILL — ATENOLOL 25 MG TABLET: 25 | 30 days supply | Qty: 30 | Fill #11

## 2018-09-16 NOTE — Telephone Encounter (Signed)
No 1/24 los.  

## 2018-09-17 ENCOUNTER — Other Ambulatory Visit: Payer: Self-pay

## 2018-09-17 ENCOUNTER — Other Ambulatory Visit: Payer: Self-pay | Admitting: Surgery

## 2018-09-17 ENCOUNTER — Telehealth: Payer: Self-pay | Admitting: Hematology and Oncology

## 2018-09-17 DIAGNOSIS — F419 Anxiety disorder, unspecified: Secondary | ICD-10-CM

## 2018-09-17 DIAGNOSIS — D0512 Intraductal carcinoma in situ of left breast: Secondary | ICD-10-CM

## 2018-09-17 MED ORDER — BUSPIRONE HCL 15 MG PO TABS: 15.0000 mg | ORAL_TABLET | Freq: Two times a day (BID) | ORAL | 0 refills | Status: DC

## 2018-09-17 MED ORDER — FLUOXETINE HCL 20 MG PO TABS: 20.0000 mg | ORAL_TABLET | Freq: Every day | ORAL | 0 refills | Status: DC

## 2018-09-17 MED FILL — busPIRone HCL 15 MG TABS: 15 | 30 days supply | Qty: 60 | Fill #0

## 2018-09-17 MED FILL — ?FLUOXETINE HCL 20MG TABLET: 20 | 30 days supply | Qty: 30 | Fill #0

## 2018-09-17 NOTE — Telephone Encounter (Signed)
Scheduled appt per 12/6 sch message - unable to reach pt. Left message with appt date and time and sent reminder letter in the mail with appt date and time

## 2018-09-20 ENCOUNTER — Telehealth (HOSPITAL_COMMUNITY): Payer: Self-pay | Admitting: *Deleted

## 2018-09-20 NOTE — Telephone Encounter (Signed)
Telephoned patient at home number and left message to return call to [facility]. Need to fill out [facility] Medicaid paperwork.  

## 2018-09-22 ENCOUNTER — Other Ambulatory Visit: Payer: Self-pay | Admitting: *Deleted

## 2018-09-22 DIAGNOSIS — D0512 Intraductal carcinoma in situ of left breast: Secondary | ICD-10-CM

## 2018-09-23 ENCOUNTER — Telehealth: Payer: Self-pay | Admitting: *Deleted

## 2018-09-23 NOTE — Telephone Encounter (Signed)
  Oncology Nurse Navigator Documentation  Navigator Location: [facility] (09/23/18 1300)   )Navigator Encounter Type: Telephone;[facility] Follow-up (09/23/18 1300) Telephone: Outgoing Call;Clinic/[facility] Follow-up (09/23/18 1300)     Surgery Date: 10/14/18 (09/23/18 1300) Genetic Counseling Date: 10/12/18 (09/23/18 1300) Genetic Counseling Type: Non-Urgent (09/23/18 1300)                                        Time Spent with Patient: 15 (09/23/18 1300)

## 2018-10-07 NOTE — Pre-Procedure Instructions (Signed)
Kimberly [name]  10/07/2018      [facility], [address] [place] [address] Phone: [phone] Fax: [phone]    Your procedure is scheduled on January 2nd.  Report to [facility] Admitting at [facility] A.M.  Call this number if you have problems the morning of surgery:  [phone]   Remember:  Do not eat after midnight.  You may drink clear liquids until 1030 .  Clear liquids allowed are:                    Water, Juice (non-citric and without pulp), Carbonated beverages, Clear Tea, Black Coffee only and Gatorade    Take these medicines the morning of surgery with A SIP OF WATER  atenolol (TENORMIN)  busPIRone (BUSPAR)  FLUoxetine (PROZAC)   7 days prior to surgery STOP taking any Aspirin (unless otherwise instructed by your surgeon), Aleve, Naproxen, Ibuprofen, Motrin, Advil, Goody's, BC's, all herbal medications, fish oil, and all vitamins.     Do not wear jewelry, make-up or nail polish.  Do not wear lotions, powders, or perfumes, or deodorant.  Do not shave 48 hours prior to surgery.  Men may shave face and neck.  Do not bring valuables to the hospital.  [facility] is not responsible for any belongings or valuables.  Contacts, dentures or bridgework may not be worn into surgery.  Leave your suitcase in the car.  After surgery it may be brought to your room.  For patients admitted to the hospital, discharge time will be determined by your treatment team.  Patients discharged the day of surgery will not be allowed to drive home.    [facility]- Preparing For Surgery  Before surgery, you can play an important role. Because skin is not sterile, your skin needs to be as free of germs as possible. You can reduce the number of germs on your skin by washing with CHG (chlorahexidine gluconate) Soap before surgery.  CHG is an antiseptic cleaner which kills germs and bonds with the skin to continue  killing germs even after washing.    Oral Hygiene is also important to reduce your risk of infection.  Remember - BRUSH YOUR TEETH THE MORNING OF SURGERY WITH YOUR REGULAR TOOTHPASTE  Please do not use if you have an allergy to CHG or antibacterial soaps. If your skin becomes reddened/irritated stop using the CHG.  Do not shave (including legs and underarms) for at least 48 hours prior to first CHG shower. It is OK to shave your face.  Please follow these instructions carefully.   1. Shower the NIGHT BEFORE SURGERY and the MORNING OF SURGERY with CHG.   2. If you chose to wash your hair, wash your hair first as usual with your normal shampoo.  3. After you shampoo, rinse your hair and body thoroughly to remove the shampoo.  4. Use CHG as you would any other liquid soap. You can apply CHG directly to the skin and wash gently with a scrungie or a clean washcloth.   5. Apply the CHG Soap to your body ONLY FROM THE NECK DOWN.  Do not use on open wounds or open sores. Avoid contact with your eyes, ears, mouth and genitals (private parts). Wash Face and genitals (private parts)  with your normal soap.  6. Wash thoroughly, paying special attention to the area where your surgery will be performed.  7. Thoroughly  rinse your body with warm water from the neck down.  8. DO NOT shower/wash with your normal soap after using and rinsing off the CHG Soap.  9. Pat yourself dry with a CLEAN TOWEL.  10. Wear CLEAN PAJAMAS to bed the night before surgery, wear comfortable clothes the morning of surgery  11. Place CLEAN SHEETS on your bed the night of your first shower and DO NOT SLEEP WITH PETS.    Day of Surgery:  Do not apply any deodorants/lotions.  Please wear clean clothes to the hospital/surgery center.   Remember to brush your teeth WITH YOUR REGULAR TOOTHPASTE.    Please read over the following fact sheets that you were given.

## 2018-10-08 ENCOUNTER — Other Ambulatory Visit: Payer: Self-pay

## 2018-10-08 ENCOUNTER — Encounter (HOSPITAL_COMMUNITY)
Admission: RE | Admit: 2018-10-08 | Discharge: 2018-10-08 | Disposition: A | Discharge status: Home / Self Care | Payer: Medicaid Other | Source: Ambulatory Visit | Attending: Surgery | Admitting: Surgery

## 2018-10-08 ENCOUNTER — Encounter (HOSPITAL_COMMUNITY): Payer: Self-pay

## 2018-10-08 DIAGNOSIS — Z01812 Encounter for preprocedural laboratory examination: Secondary | ICD-10-CM | POA: Diagnosis not present

## 2018-10-08 DIAGNOSIS — D0512 Intraductal carcinoma in situ of left breast: Secondary | ICD-10-CM | POA: Diagnosis not present

## 2018-10-08 HISTORY — DX: Malignant (primary) neoplasm, unspecified: C80.1

## 2018-10-08 LAB — CBC WITH DIFFERENTIAL/PLATELET
ABS IMMATURE GRANULOCYTES: 0.02 10*3/uL (ref 0.00–0.07)
BASOS ABS: 0.1 10*3/uL (ref 0.0–0.1)
Basophils Relative: 1 %
Eosinophils Absolute: 0.2 10*3/uL (ref 0.0–0.5)
Eosinophils Relative: 4 %
HEMATOCRIT: 45 % (ref 36.0–46.0)
HEMOGLOBIN: 14.7 g/dL (ref 12.0–15.0)
IMMATURE GRANULOCYTES: 0 %
LYMPHS ABS: 1.9 10*3/uL (ref 0.7–4.0)
LYMPHS PCT: 31 %
MCH: 30.4 pg (ref 26.0–34.0)
MCHC: 32.7 g/dL (ref 30.0–36.0)
MCV: 93 fL (ref 80.0–100.0)
Monocytes Absolute: 0.6 10*3/uL (ref 0.1–1.0)
Monocytes Relative: 9 %
NEUTROS ABS: 3.3 10*3/uL (ref 1.7–7.7)
NRBC: 0 % (ref 0.0–0.2)
Neutrophils Relative %: 55 %
Platelets: 473 10*3/uL — ABNORMAL HIGH (ref 150–400)
RBC: 4.84 MIL/uL (ref 3.87–5.11)
RDW: 12.7 % (ref 11.5–15.5)
WBC: 6.1 10*3/uL (ref 4.0–10.5)

## 2018-10-08 LAB — COMPREHENSIVE METABOLIC PANEL
ALBUMIN: 4.1 g/dL (ref 3.5–5.0)
ALK PHOS: 74 U/L (ref 38–126)
ALT: 15 U/L (ref 0–44)
AST: 19 U/L (ref 15–41)
Anion gap: 7 (ref 5–15)
BILIRUBIN TOTAL: 0.4 mg/dL (ref 0.3–1.2)
BUN: 15 mg/dL (ref 8–23)
CALCIUM: 9.9 mg/dL (ref 8.9–10.3)
CO2: 25 mmol/L (ref 22–32)
Chloride: 107 mmol/L (ref 98–111)
Creatinine, Ser: 0.95 mg/dL (ref 0.44–1.00)
GFR calc Af Amer: >60 mL/min (ref >60)
GFR calc non Af Amer: >60 mL/min (ref >60)
GLUCOSE: 104 mg/dL — AB (ref 70–99)
Potassium: 4.6 mmol/L (ref 3.5–5.1)
SODIUM: 139 mmol/L (ref 135–145)
TOTAL PROTEIN: 7.8 g/dL (ref 6.5–8.1)

## 2018-10-08 NOTE — Progress Notes (Signed)
PCP - Karle Plumber MD  Chest x-ray -  N/A EKG - 08/03/18  Blood Thinner Instructions: N/A Aspirin Instructions: N/A  Anesthesia review: none  Patient denies shortness of breath, fever, cough and chest pain at PAT appointment   Patient verbalized understanding of instructions that were given to them at the PAT appointment. Patient was also instructed that they will need to review over the PAT instructions again at home before surgery.

## 2018-10-11 ENCOUNTER — Ambulatory Visit
Admission: RE | Admit: 2018-10-11 | Discharge: 2018-10-11 | Disposition: A | Discharge status: Home / Self Care | Payer: No Typology Code available for payment source | Source: Ambulatory Visit | Attending: Surgery | Admitting: Surgery

## 2018-10-11 DIAGNOSIS — D0512 Intraductal carcinoma in situ of left breast: Secondary | ICD-10-CM

## 2018-10-12 ENCOUNTER — Inpatient Hospital Stay (HOSPITAL_BASED_OUTPATIENT_CLINIC_OR_DEPARTMENT_OTHER): Payer: Medicaid Other | Admitting: Genetics

## 2018-10-12 ENCOUNTER — Encounter: Payer: Self-pay | Admitting: Genetics

## 2018-10-12 ENCOUNTER — Inpatient Hospital Stay: Payer: Medicaid Other

## 2018-10-12 DIAGNOSIS — Z803 Family history of malignant neoplasm of breast: Secondary | ICD-10-CM

## 2018-10-12 DIAGNOSIS — Z170 Estrogen receptor positive status [ER+]: Secondary | ICD-10-CM

## 2018-10-12 DIAGNOSIS — I10 Essential (primary) hypertension: Secondary | ICD-10-CM

## 2018-10-12 DIAGNOSIS — D0512 Intraductal carcinoma in situ of left breast: Secondary | ICD-10-CM

## 2018-10-12 DIAGNOSIS — Z801 Family history of malignant neoplasm of trachea, bronchus and lung: Secondary | ICD-10-CM | POA: Insufficient documentation

## 2018-10-12 DIAGNOSIS — Z79899 Other long term (current) drug therapy: Secondary | ICD-10-CM

## 2018-10-12 NOTE — Progress Notes (Signed)
REFERRING PROVIDER: Nicholas Lose, MD [place], [place] [address]  PRIMARY PROVIDER:  Ladell Pier, MD  PRIMARY REASON FOR VISIT:  1. Ductal carcinoma in situ (DCIS) of left breast   2. Family history of breast cancer   3. Family history of lung cancer    HISTORY OF PRESENT ILLNESS:   Ms. Betterton, a 62 y.o. female, was seen for a [facility] cancer genetics consultation at the request of Dr. Lindi Adie due to a personal and family history of cancer.  Ms. Rotert presents to clinic today to discuss the possibility of a hereditary predisposition to cancer, genetic testing, and to further clarify her future cancer risks, as well as potential cancer risks for family members.   In 2019, at the age of 34, Ms. Breuer was diagnosed with DCIS of the left breast, ER/PR+. She is undergoing a left lumpectomy on 10/14/2017 and currently plans adjuvant radiation and tamoxifen.   CANCER HISTORY:    Ductal carcinoma in situ (DCIS) of left breast   09/07/2018 Initial Diagnosis    Bilateral masses and calcifications, right breast benign, left breast UOQ 3.2 cm biopsy revealed intermediate grade to high-grade DCIS ER 100%, PR 100%, 4 o'clock position retroareolar calcifications 2.2 cm biopsy revealed intermediate grade DCIS with [facility] ER 100%, PR 90%, both lesions are 5.5 cm apart, Tis NX stage 0      HORMONAL RISK FACTORS:  Menarche was at age 29.  First live birth at age 16.  Ovaries intact: yes.  Hysterectomy: no.  Menopausal status: postmenopausal.  HRT use: 0 years. Colonoscopy: yes; 08/2018. Mammogram within the last year: yes.   Past Medical History:  Diagnosis Date  . Anxiety 1995  . Cancer [facility])    breast left  . Depression 1995  . Family history of breast cancer   . Family history of lung cancer   . History of kidney stones   . History of rheumatic fever as a child   . Hypertension Dx Dec 2015  . Panic attack 1995  . Tremor     Past Surgical  History:  Procedure Laterality Date  . APPENDECTOMY    . bowel obstruction    . CHOLECYSTECTOMY    . COLONOSCOPY WITH PROPOFOL N/A 08/05/2018   Procedure: COLONOSCOPY WITH PROPOFOL;  Surgeon: Daneil Dolin, MD;  Location: [facility] SU[facility];  Service: Endoscopy;  Laterality: N/A;  12:45pm  . TONSILLECTOMY      Social History   Socioeconomic History  . Marital status: Single    Spouse name: Not on file  . Number of children: Not on file  . Years of education: Not on file  . Highest education level: Not on file  Occupational History  . Not on file  Social Needs  . Financial resource strain: Not on file  . Food insecurity:    Worry: Not on file    Inability: Not on file  . Transportation needs:    Medical: Not on file    Non-medical: Not on file  Tobacco Use  . Smoking status: Never Smoker  . Smokeless tobacco: Never Used  Substance and Sexual Activity  . Alcohol use: No    Alcohol/week: 0.0 standard drinks  . Drug use: No  . Sexual activity: Yes    Birth control/protection: Post-menopausal  Lifestyle  . Physical activity:    Days per week: Not on file    Minutes per session: Not on file  . Stress: Not on file  Relationships  .  Social connections:    Talks on phone: Not on file    Gets together: Not on file    Attends religious service: Not on file    Active member of club or organization: Not on file    Attends meetings of clubs or organizations: Not on file    Relationship status: Not on file  Other Topics Concern  . Not on file  Social History Narrative  . Not on file     FAMILY HISTORY:  We obtained a detailed, 4-generation family history.  Significant diagnoses are listed below: Family History  Problem Relation Age of Onset  . Breast cancer Mother 98  . Cancer Maternal Grandmother        thinks it was braest, but not sure what type of cancer, dx >50  . Heart disease Paternal Grandfather   . Lung cancer Maternal Aunt   . Colon cancer Neg Hx     Ms.  Topor has a 33 year-old daughter and a 36 year-old son with no hx of cancer.  She has a brother who is 72 with no hx of cancer and a maternal half sister who is in her 5's with no hx of cancer.   Ms. Vasbinder father: unk, no information known about her paternal family history  Ms. Reichelt's mother: died at 58 due to breast cancer dx at 16.  Maternal Aunts/Uncles: 1 maternal aunt died at 52 with lung cancer and 1 maternal uncle has no hx of cancer Maternal cousins: no hx of cancer Maternal grandfather: died of a heart attack Maternal grandmother:had cancer dx >50- she thinks it was breast cancer but is not sure.   Ms. Whitelaw is unaware of previous family history of genetic testing for hereditary cancer risks. Patient's maternal ancestors are of African American/Native american descent, and paternal ancestors are of unk descent. There is no reported Ashkenazi Jewish ancestry. There is no known consanguinity.  GENETIC COUNSELING ASSESSMENT: JENAVIE STANCZAK is a 62 y.o. female with a personal and family history which is somewhat suggestive of a Hereditary Cancer Predisposition Syndrome. We, therefore, discussed and recommended the following at today's visit.   DISCUSSION: We reviewed the characteristics, features and inheritance patterns of hereditary cancer syndromes. We also discussed genetic testing, including the appropriate family members to test, the process of testing, insurance coverage and turn-around-time for results. We discussed the implications of a negative, positive and/or variant of uncertain significant result. We offered Ms. Case pursue genetic testing for the Common Hereditary Cancers Panel- she preferred a more targeted panel so we therefore recommended the Breast and Gyn cancer gene panel.   Breast and Gyn Panel: ATM BARD1 BRCA1 BRCA2 BRIP1 CDH1 CHEK2 DICER1 EPCAM MLH1 MSH2 MSH6 NBN NF1 PALB2 PMS2 PTEN RAD50 RAD51C RAD51D SMARCA4 STK11 TP53  We discussed that only  5-10% of cancers are associated with a Hereditary cancer predisposition syndrome.  One of the most common hereditary cancer syndromes that increases breast cancer risk is called Hereditary Breast and Ovarian Cancer (HBOC) syndrome.  This syndrome is caused by mutations in the BRCA1 and BRCA2 genes.  This syndrome increases an individual's lifetime risk to develop breast, ovarian, pancreatic, and other types of cancer.  There are also many other cancer predisposition syndromes caused by mutations in several other genes.  We discussed that if she is found to have a mutation in one of these genes, it may impact surgical decisions, and alter future medical management recommendations such as increased cancer screenings and consideration of  risk reducing surgeries.  A positive result could also have implications for the patient's family members.  A Negative result would mean we were unable to identify a hereditary component to her cancer, but does not rule out the possibility of a hereditary basis for her cancer.  There could be mutations that are undetectable by current technology, or in genes not yet tested or identified to increase cancer risk.    We discussed the potential to find a Variant of Uncertain Significance or VUS.  These are variants that have not yet been identified as pathogenic or benign, and it is unknown if this variant is associated with increased cancer risk or if this is a normal finding.  Most VUS's are reclassified to benign or likely benign.   It should not be used to make medical management decisions. With time, we suspect the lab will determine the significance of any VUS's identified if any.   Based on Ms. Yount's personal and family history of cancer, she meets NCCN medical criteria if her MGM had breast cancer. She suspects this but is not completely sure which type of cancer she had. However, given her personal and family history of breast cancer it is reasonable to perform  genetic testing. ASBS recommends genetic testing for all women with breast cancer.   She submitted an application for the patient assistance program to cover the cost of testing- she will send or drop off a copy of her bank statement to submit with this as verification of income.   PLAN: After considering the risks, benefits, and limitations, Ms. Horsfall  provided informed consent to pursue genetic testing and the saliva sample was sent to [facility] for analysis of the Breast and Gyn Cancer Panel. Results should be available within approximately 2-3 weeks' time, at which point they will be disclosed by telephone to Ms. Alewine, as will any additional recommendations warranted by these results. Ms. Palmatier will receive a summary of her genetic counseling visit and a copy of her results once available. This information will also be available in Epic. We encouraged Ms. Filler to remain in contact with cancer genetics annually so that we can continuously update the family history and inform her of any changes in cancer genetics and testing that may be of benefit for her family. Ms. Vanallen questions were answered to her satisfaction today. Our contact information was provided should additional questions or concerns arise.  Lastly, we encouraged Ms. Corpening to remain in contact with cancer genetics annually so that we can continuously update the family history and inform her of any changes in cancer genetics and testing that may be of benefit for this family.   Ms.  Porras questions were answered to her satisfaction today. Our contact information was provided should additional questions or concerns arise. Thank you for the referral and allowing [place] to share in the care of your patient.   Tana Felts, MS, [facility] Certified Genetic Counselor [email or web address] phone: [phone]  The patient was seen for a total of 35 minutes in face-to-face genetic counseling.  The patient  was accompanied today by her friend from church. This patient was discussed with Drs. Magrinat, Lindi Adie and/or Burr Medico who agrees with the above.

## 2018-10-13 DIAGNOSIS — Z923 Personal history of irradiation: Secondary | ICD-10-CM

## 2018-10-13 DIAGNOSIS — C50919 Malignant neoplasm of unspecified site of unspecified female breast: Secondary | ICD-10-CM

## 2018-10-13 HISTORY — DX: Personal history of irradiation: Z92.3

## 2018-10-13 HISTORY — DX: Malignant neoplasm of unspecified site of unspecified female breast: C50.919

## 2018-10-14 ENCOUNTER — Encounter (HOSPITAL_COMMUNITY): Payer: Self-pay | Admitting: *Deleted

## 2018-10-14 ENCOUNTER — Ambulatory Visit (HOSPITAL_COMMUNITY): Payer: Medicaid Other | Admitting: Anesthesiology

## 2018-10-14 ENCOUNTER — Ambulatory Visit
Admission: RE | Admit: 2018-10-14 | Discharge: 2018-10-14 | Disposition: A | Discharge status: Home / Self Care | Payer: No Typology Code available for payment source | Source: Ambulatory Visit | Attending: Surgery | Admitting: Surgery

## 2018-10-14 ENCOUNTER — Encounter (HOSPITAL_COMMUNITY)
Admission: RE | Disposition: A | Discharge status: Home / Self Care | Payer: Self-pay | Source: Home / Self Care | Attending: Surgery

## 2018-10-14 ENCOUNTER — Ambulatory Visit (HOSPITAL_COMMUNITY)
Admission: RE | Admit: 2018-10-14 | Discharge: 2018-10-14 | Disposition: A | Discharge status: Home / Self Care | Payer: Medicaid Other | Attending: Surgery | Admitting: Surgery

## 2018-10-14 DIAGNOSIS — G8929 Other chronic pain: Secondary | ICD-10-CM | POA: Diagnosis not present

## 2018-10-14 DIAGNOSIS — R011 Cardiac murmur, unspecified: Secondary | ICD-10-CM | POA: Insufficient documentation

## 2018-10-14 DIAGNOSIS — D0512 Intraductal carcinoma in situ of left breast: Secondary | ICD-10-CM | POA: Diagnosis present

## 2018-10-14 DIAGNOSIS — Z803 Family history of malignant neoplasm of breast: Secondary | ICD-10-CM | POA: Insufficient documentation

## 2018-10-14 DIAGNOSIS — F419 Anxiety disorder, unspecified: Secondary | ICD-10-CM | POA: Insufficient documentation

## 2018-10-14 DIAGNOSIS — Z170 Estrogen receptor positive status [ER+]: Secondary | ICD-10-CM | POA: Insufficient documentation

## 2018-10-14 DIAGNOSIS — Z87442 Personal history of urinary calculi: Secondary | ICD-10-CM | POA: Insufficient documentation

## 2018-10-14 DIAGNOSIS — Z9049 Acquired absence of other specified parts of digestive tract: Secondary | ICD-10-CM | POA: Insufficient documentation

## 2018-10-14 DIAGNOSIS — Z836 Family history of other diseases of the respiratory system: Secondary | ICD-10-CM | POA: Insufficient documentation

## 2018-10-14 DIAGNOSIS — I10 Essential (primary) hypertension: Secondary | ICD-10-CM | POA: Insufficient documentation

## 2018-10-14 HISTORY — PX: BREAST LUMPECTOMY WITH RADIOACTIVE SEED LOCALIZATION: SHX6424

## 2018-10-14 HISTORY — PX: BREAST LUMPECTOMY: SHX2

## 2018-10-14 SURGERY — BREAST LUMPECTOMY WITH RADIOACTIVE SEED LOCALIZATION
Anesthesia: General | Site: Breast | Laterality: Left

## 2018-10-14 MED ORDER — CELECOXIB 200 MG PO CAPS
200.0000 mg | ORAL_CAPSULE | ORAL | Status: AC
  Administered 2018-10-14: 200 mg via ORAL
  Filled 2018-10-14: qty 1

## 2018-10-14 MED ORDER — DEXAMETHASONE SODIUM PHOSPHATE 10 MG/ML IJ SOLN
INTRAMUSCULAR | Status: DC | PRN
  Administered 2018-10-14: 10 mg via INTRAVENOUS

## 2018-10-14 MED ORDER — EPHEDRINE SULFATE-NACL 50-0.9 MG/10ML-% IV SOSY
PREFILLED_SYRINGE | INTRAVENOUS | Status: DC | PRN
  Administered 2018-10-14 (×5): 5 mg via INTRAVENOUS

## 2018-10-14 MED ORDER — CHLORHEXIDINE GLUCONATE CLOTH 2 % EX PADS: 6.0000 | MEDICATED_PAD | Freq: Once | CUTANEOUS | Status: DC

## 2018-10-14 MED ORDER — FENTANYL CITRATE (PF) 100 MCG/2ML IJ SOLN: 25.0000 ug | INTRAMUSCULAR | Status: DC | PRN

## 2018-10-14 MED ORDER — GABAPENTIN 300 MG PO CAPS
300.0000 mg | ORAL_CAPSULE | ORAL | Status: AC
  Administered 2018-10-14: 300 mg via ORAL
  Filled 2018-10-14: qty 1

## 2018-10-14 MED ORDER — ONDANSETRON HCL 4 MG/2ML IJ SOLN
INTRAMUSCULAR | Status: AC
  Filled 2018-10-14: qty 2

## 2018-10-14 MED ORDER — ONDANSETRON HCL 4 MG/2ML IJ SOLN
INTRAMUSCULAR | Status: DC | PRN
  Administered 2018-10-14: 4 mg via INTRAVENOUS

## 2018-10-14 MED ORDER — CEFAZOLIN SODIUM-DEXTROSE 2-4 GM/100ML-% IV SOLN
2.0000 g | INTRAVENOUS | Status: AC
  Administered 2018-10-14: 2 g via INTRAVENOUS
  Filled 2018-10-14: qty 100

## 2018-10-14 MED ORDER — KETOROLAC TROMETHAMINE 15 MG/ML IJ SOLN
15.0000 mg | Freq: Once | INTRAMUSCULAR | Status: AC
  Administered 2018-10-14: 15 mg via INTRAVENOUS

## 2018-10-14 MED ORDER — MEPERIDINE HCL 50 MG/ML IJ SOLN: 6.2500 mg | INTRAMUSCULAR | Status: DC | PRN

## 2018-10-14 MED ORDER — KETOROLAC TROMETHAMINE 15 MG/ML IJ SOLN
INTRAMUSCULAR | Status: AC
  Filled 2018-10-14: qty 1

## 2018-10-14 MED ORDER — DEXAMETHASONE SODIUM PHOSPHATE 10 MG/ML IJ SOLN
INTRAMUSCULAR | Status: AC
  Filled 2018-10-14: qty 1

## 2018-10-14 MED ORDER — OXYCODONE HCL 5 MG PO TABS
5.0000 mg | ORAL_TABLET | Freq: Once | ORAL | Status: AC | PRN
  Administered 2018-10-14: 5 mg via ORAL

## 2018-10-14 MED ORDER — MIDAZOLAM HCL 2 MG/2ML IJ SOLN
INTRAMUSCULAR | Status: AC
  Filled 2018-10-14: qty 2

## 2018-10-14 MED ORDER — 0.9 % SODIUM CHLORIDE (POUR BTL) OPTIME
TOPICAL | Status: DC | PRN
  Administered 2018-10-14: 1000 mL

## 2018-10-14 MED ORDER — OXYCODONE HCL 5 MG PO TABS
ORAL_TABLET | ORAL | Status: AC
  Filled 2018-10-14: qty 1

## 2018-10-14 MED ORDER — IBUPROFEN 800 MG PO TABS: 800.0000 mg | ORAL_TABLET | Freq: Three times a day (TID) | ORAL | 0 refills | Status: DC | PRN

## 2018-10-14 MED ORDER — ACETAMINOPHEN 160 MG/5ML PO SOLN: 325.0000 mg | ORAL | Status: DC | PRN

## 2018-10-14 MED ORDER — ACETAMINOPHEN 325 MG PO TABS: 325.0000 mg | ORAL_TABLET | ORAL | Status: DC | PRN

## 2018-10-14 MED ORDER — PROPOFOL 10 MG/ML IV BOLUS
INTRAVENOUS | Status: AC
  Filled 2018-10-14: qty 20

## 2018-10-14 MED ORDER — OXYCODONE HCL 5 MG PO TABS: 5.0000 mg | ORAL_TABLET | Freq: Four times a day (QID) | ORAL | 0 refills | Status: DC | PRN

## 2018-10-14 MED ORDER — LIDOCAINE 2% (20 MG/ML) 5 ML SYRINGE
INTRAMUSCULAR | Status: DC | PRN
  Administered 2018-10-14: 80 mg via INTRAVENOUS

## 2018-10-14 MED ORDER — LACTATED RINGERS IV SOLN
INTRAVENOUS | Status: DC
  Administered 2018-10-14 (×2): via INTRAVENOUS

## 2018-10-14 MED ORDER — FENTANYL CITRATE (PF) 250 MCG/5ML IJ SOLN
INTRAMUSCULAR | Status: AC
  Filled 2018-10-14: qty 5

## 2018-10-14 MED ORDER — BUPIVACAINE-EPINEPHRINE 0.25% -1:200000 IJ SOLN
INTRAMUSCULAR | Status: DC | PRN
  Administered 2018-10-14: 20 mL

## 2018-10-14 MED ORDER — OXYCODONE HCL 5 MG/5ML PO SOLN: 5.0000 mg | Freq: Once | ORAL | Status: AC | PRN

## 2018-10-14 MED ORDER — ACETAMINOPHEN 500 MG PO TABS
1000.0000 mg | ORAL_TABLET | ORAL | Status: AC
  Administered 2018-10-14: 1000 mg via ORAL
  Filled 2018-10-14: qty 2

## 2018-10-14 MED ORDER — ONDANSETRON HCL 4 MG/2ML IJ SOLN: 4.0000 mg | Freq: Once | INTRAMUSCULAR | Status: DC | PRN

## 2018-10-14 MED ORDER — LIDOCAINE 2% (20 MG/ML) 5 ML SYRINGE
INTRAMUSCULAR | Status: AC
  Filled 2018-10-14: qty 5

## 2018-10-14 MED ORDER — FENTANYL CITRATE (PF) 100 MCG/2ML IJ SOLN
INTRAMUSCULAR | Status: DC | PRN
  Administered 2018-10-14: 25 ug via INTRAVENOUS
  Administered 2018-10-14: 50 ug via INTRAVENOUS
  Administered 2018-10-14: 25 ug via INTRAVENOUS

## 2018-10-14 MED ORDER — PROPOFOL 10 MG/ML IV BOLUS
INTRAVENOUS | Status: DC | PRN
  Administered 2018-10-14: 150 mg via INTRAVENOUS

## 2018-10-14 MED ORDER — BUPIVACAINE-EPINEPHRINE (PF) 0.25% -1:200000 IJ SOLN
INTRAMUSCULAR | Status: AC
  Filled 2018-10-14: qty 30

## 2018-10-14 MED ORDER — MIDAZOLAM HCL 5 MG/5ML IJ SOLN
INTRAMUSCULAR | Status: DC | PRN
  Administered 2018-10-14: 2 mg via INTRAVENOUS

## 2018-10-14 MED FILL — IBUPROFEN 800 MG TABLET: 800 | 10 days supply | Qty: 30 | Fill #0

## 2018-10-14 SURGICAL SUPPLY — 37 items
APPLIER CLIP 9.375 MED OPEN (MISCELLANEOUS)
BINDER BREAST LRG (GAUZE/BANDAGES/DRESSINGS) IMPLANT
BINDER BREAST XLRG (GAUZE/BANDAGES/DRESSINGS) IMPLANT
CANISTER SUCT 3000ML PPV (MISCELLANEOUS) IMPLANT
CHLORAPREP W/TINT 26ML (MISCELLANEOUS) ×3 IMPLANT
CLIP APPLIE 9.375 MED OPEN (MISCELLANEOUS) IMPLANT
COVER PROBE W GEL 5X96 (DRAPES) ×3 IMPLANT
COVER SURGICAL LIGHT HANDLE (MISCELLANEOUS) ×3 IMPLANT
COVER WAND RF STERILE (DRAPES) ×3 IMPLANT
DERMABOND ADVANCED (GAUZE/BANDAGES/DRESSINGS) ×2
DERMABOND ADVANCED .7 DNX12 (GAUZE/BANDAGES/DRESSINGS) ×1 IMPLANT
DEVICE DUBIN SPECIMEN MAMMOGRA (MISCELLANEOUS) ×3 IMPLANT
DRAPE CHEST BREAST 15X10 FENES (DRAPES) ×3 IMPLANT
ELECT REM PT RETURN 9FT ADLT (ELECTROSURGICAL) ×3
ELECTRODE REM PT RTRN 9FT ADLT (ELECTROSURGICAL) ×1 IMPLANT
GLOVE BIO SURGEON STRL SZ8 (GLOVE) ×3 IMPLANT
GLOVE BIOGEL PI IND STRL 8 (GLOVE) ×1 IMPLANT
GLOVE BIOGEL PI INDICATOR 8 (GLOVE) ×2
GOWN STRL REUS W/ TWL LRG LVL3 (GOWN DISPOSABLE) ×1 IMPLANT
GOWN STRL REUS W/ TWL XL LVL3 (GOWN DISPOSABLE) ×1 IMPLANT
GOWN STRL REUS W/TWL LRG LVL3 (GOWN DISPOSABLE) ×2
GOWN STRL REUS W/TWL XL LVL3 (GOWN DISPOSABLE) ×2
KIT BASIN OR (CUSTOM PROCEDURE TRAY) ×3 IMPLANT
KIT MARKER MARGIN INK (KITS) IMPLANT
LIGHT WAVEGUIDE WIDE FLAT (MISCELLANEOUS) IMPLANT
NDL HYPO 25GX1X1/2 BEV (NEEDLE) IMPLANT
NEEDLE HYPO 25GX1X1/2 BEV (NEEDLE) IMPLANT
NS IRRIG 1000ML POUR BTL (IV SOLUTION) IMPLANT
PACK GENERAL/GYN (CUSTOM PROCEDURE TRAY) ×3 IMPLANT
SUT MNCRL AB 4-0 PS2 18 (SUTURE) ×3 IMPLANT
SUT SILK 2 0 SH (SUTURE) IMPLANT
SUT VIC AB 2-0 SH 27 (SUTURE) ×2
SUT VIC AB 2-0 SH 27XBRD (SUTURE) ×1 IMPLANT
SUT VIC AB 3-0 SH 27 (SUTURE) ×6
SUT VIC AB 3-0 SH 27X BRD (SUTURE) ×1 IMPLANT
SYR CONTROL 10ML LL (SYRINGE) IMPLANT
TOWEL NATURAL 6PK STERILE (DISPOSABLE) IMPLANT

## 2018-10-14 NOTE — Discharge Instructions (Signed)
[facility] °Office Phone Number [phone] ° °BREAST BIOPSY/ PARTIAL MASTECTOMY: POST OP INSTRUCTIONS ° °Always review your discharge instruction sheet given to you by the facility where your surgery was performed. ° °IF YOU HAVE DISABILITY OR FAMILY LEAVE FORMS, YOU MUST BRING THEM TO THE OFFICE FOR PROCESSING.  DO NOT GIVE THEM TO YOUR DOCTOR. ° °1. A prescription for pain medication may be given to you upon discharge.  Take your pain medication as prescribed, if needed.  If narcotic pain medicine is not needed, then you may take acetaminophen (Tylenol) or ibuprofen (Advil) as needed. °2. Take your usually prescribed medications unless otherwise directed °3. If you need a refill on your pain medication, please contact your pharmacy.  They will contact our office to request authorization.  Prescriptions will not be filled after 5pm or on week-ends. °4. You should eat very light the first 24 hours after surgery, such as soup, crackers, pudding, etc.  Resume your normal diet the day after surgery. °5. Most patients will experience some swelling and bruising in the breast.  Ice packs and a good support bra will help.  Swelling and bruising can take several days to resolve.  °6. It is common to experience some constipation if taking pain medication after surgery.  Increasing fluid intake and taking a stool softener will usually help or prevent this problem from occurring.  A mild laxative (Milk of Magnesia or Miralax) should be taken according to package directions if there are no bowel movements after 48 hours. °7. Unless discharge instructions indicate otherwise, you may remove your bandages 24-48 hours after surgery, and you may shower at that time.  You may have steri-strips (small skin tapes) in place directly over the incision.  These strips should be left on the skin for 7-10 days.  If your surgeon used skin glue on the incision, you may shower in 24 hours.  The glue will flake off over the  next 2-3 weeks.  Any sutures or staples will be removed at the office during your follow-up visit. °8. ACTIVITIES:  You may resume regular daily activities (gradually increasing) beginning the next day.  Wearing a good support bra or sports bra minimizes pain and swelling.  You may have sexual intercourse when it is comfortable. °a. You may drive when you no longer are taking prescription pain medication, you can comfortably wear a seatbelt, and you can safely maneuver your car and apply brakes. °b. RETURN TO WORK:  ______________________________________________________________________________________ °9. You should see your doctor in the office for a follow-up appointment approximately two weeks after your surgery.  Your doctor’s nurse will typically make your follow-up appointment when she calls you with your pathology report.  Expect your pathology report 2-3 business days after your surgery.  You may call to check if you do not hear from us after three days. °10. OTHER INSTRUCTIONS: _______________________________________________________________________________________________ _____________________________________________________________________________________________________________________________________ °_____________________________________________________________________________________________________________________________________ °_____________________________________________________________________________________________________________________________________ ° °WHEN TO CALL YOUR DOCTOR: °1. Fever over 101.0 °2. Nausea and/or vomiting. °3. Extreme swelling or bruising. °4. Continued bleeding from incision. °5. Increased pain, redness, or drainage from the incision. ° °The clinic staff is available to answer your questions during regular business hours.  Please don’t hesitate to call and ask to speak to one of the nurses for clinical concerns.  If you have a medical emergency, go to the nearest  emergency room or call 911.  A surgeon from [facility] is always on call at the hospital. ° °For further questions, please visit [email or web address]  °

## 2018-10-14 NOTE — Interval H&P Note (Signed)
History and Physical Interval Note:  10/14/2018 12:56 PM  Kimberly [name]  has presented today for surgery, with the diagnosis of LEFT BREAST CANCER  The various methods of treatment have been discussed with the patient and family. After consideration of risks, benefits and other options for treatment, the patient has consented to  Procedure(s): BREAST LUMPECTOMY WITH RADIOACTIVE SEED LOCALIZATION X2 (Left) as a surgical intervention .  The patient's history has been reviewed, patient examined, no change in status, stable for surgery.  I have reviewed the patient's chart and labs.  Questions were answered to the patient's satisfaction.     [place]

## 2018-10-14 NOTE — Op Note (Signed)
Preoperative diagnosis: Left breast DCIS  Postoperative diagnosis: Same  Procedure: Seed localized lumpectomy left breast  Surgeon: Erroll Luna, MD  Anesthesia: LMA with 0.25% Sensorcaine local with epinephrine  EBL: 20 cc  Specimen: Left breast tissue with 2 localizing seeds and 1 of the clips in the specimen.  Second clip was dislodged.  Specimen was adequate and calcifications are contained with a grossly negative margin sent to pathology.  Drains: None  Indications for procedure: The patient is a 63 year old female with a mammographic abnormality of her left breast and microcalcifications.  Core biopsy was done which showed DCIS spanning approximately 2.5 cm in the central portion of the left breast.  She opted for breast conservation lumpectomy.The procedure has been discussed with the patient. Alternatives to surgery have been discussed with the patient.  Risks of surgery include bleeding,  Infection,  Seroma formation, death,  and the need for further surgery.   The patient understands and wishes to proceed.   Description of procedure: The patient was met in the holding area.  Questions were answered neoprobe was you have used to verify location of both seeds in the left central breast.  She was taken back to operating.  She is placed supine upon the operating table.  After induction of LMA anesthesia, left breast was prepped and draped in sterile fashion and timeout was done.  She received appropriate preoperative antibiotics.  The neoprobe was used in both seeds to be localized in the central left breast.  Local anesthetic was infiltrated along the lateral border of the nipple with a complex.  Curvilinear incision was made along this border.  Dissection was carried around to encompass all tissue around both seeds.  Of note the more superficial clip was not in the specimen.  Both seeds were contained though as well as the calcifications in between the both.  Gross margins were negative.   Faxitron image revealed the above findings.  This was relayed to radiology.  I took 2 additional anterior margins and the clip was not contained in this.  He was more likely dislodged due to its superficial nature with the area of interest was removed in its entirety with grossly negative margins.  Irrigation was used.  The cavity was clipped for radiation therapy.  Irrigation was used.  Hemostasis achieved.  The cavity was closed with a combination of 3-0 Vicryl and 4-0 Monocryl.  Dermabond applied.  All final counts found to be correct.  The patient was awoke extubated taken to recovery in satisfactory condition.

## 2018-10-14 NOTE — Anesthesia Procedure Notes (Signed)
Procedure Name: LMA Insertion Date/Time: 10/14/2018 1:41 PM Performed by: Genelle Bal, CRNA Pre-anesthesia Checklist: Patient identified, Emergency Drugs available, Suction available and Patient being monitored Patient Re-evaluated:Patient Re-evaluated prior to induction Oxygen Delivery Method: Circle system utilized Preoxygenation: Pre-oxygenation with 100% oxygen Induction Type: IV induction Ventilation: Mask ventilation without difficulty LMA: LMA inserted LMA Size: 4.0 Number of attempts: 1 Placement Confirmation: positive ETCO2 Tube secured with: Tape Dental Injury: Teeth and Oropharynx as per pre-operative assessment

## 2018-10-14 NOTE — Anesthesia Preprocedure Evaluation (Addendum)
Anesthesia Evaluation  Patient identified by MRN, date of birth, ID band Patient awake    Reviewed: Allergy & Precautions, NPO status , Patient's Chart, lab work & pertinent test results, reviewed documented beta blocker date and time   Airway Mallampati: I       Dental  (+) Caps,    Pulmonary    Pulmonary exam normal breath sounds clear to auscultation       Cardiovascular hypertension, Pt. on home beta blockers Normal cardiovascular exam Rhythm:Regular Rate:Normal     Neuro/Psych PSYCHIATRIC DISORDERS Anxiety Depression    GI/Hepatic negative GI ROS,   Endo/Other  negative endocrine ROS  Renal/GU negative Renal ROS  negative genitourinary   Musculoskeletal negative musculoskeletal ROS (+)   Abdominal Normal abdominal exam  (+)   Peds  Hematology negative hematology ROS (+)   Anesthesia Other Findings   Reproductive/Obstetrics                            Anesthesia Physical Anesthesia Plan  ASA: II  Anesthesia Plan: General   Post-op Pain Management:    Induction:   PONV Risk Score and Plan: 4 or greater and Ondansetron and Dexamethasone  Airway Management Planned: LMA  Additional Equipment:   Intra-op Plan:   Post-operative Plan: Extubation in OR  Informed Consent: I have reviewed the patients History and Physical, chart, labs and discussed the procedure including the risks, benefits and alternatives for the proposed anesthesia with the patient or authorized representative who has indicated his/her understanding and acceptance.   Dental advisory given  Plan Discussed with: CRNA  Anesthesia Plan Comments:         Anesthesia Quick Evaluation

## 2018-10-14 NOTE — Transfer of Care (Signed)
Immediate Anesthesia Transfer of Care Note  Patient: Kimberly [name]  Procedure(s) Performed: BREAST LUMPECTOMY WITH RADIOACTIVE SEED LOCALIZATION X2 (Left Breast)  Patient Location: PACU  Anesthesia Type:General  Level of Consciousness: awake, alert  and oriented  Airway & Oxygen Therapy: Patient Spontanous Breathing and Patient connected to face mask oxygen  Post-op Assessment: Report given to RN and Post -op Vital signs reviewed and stable  Post vital signs: Reviewed and stable  Last Vitals:  Vitals Value Taken Time  BP 106/65   Temp 98.6   Pulse 78 10/14/2018  2:53 PM  Resp 17   SpO2 96 % 10/14/2018  2:53 PM  Vitals shown include unvalidated device data.  Last Pain:  Vitals:   10/14/18 1043  TempSrc:   PainSc: 0-No pain      Patients Stated Pain Goal: 8 (25/00/37 0488)  Complications: No apparent anesthesia complications

## 2018-10-15 ENCOUNTER — Encounter (HOSPITAL_COMMUNITY): Payer: Self-pay | Admitting: *Deleted

## 2018-10-15 ENCOUNTER — Encounter (HOSPITAL_COMMUNITY): Payer: Self-pay | Admitting: Surgery

## 2018-10-15 NOTE — Anesthesia Postprocedure Evaluation (Signed)
Anesthesia Post Note  Patient: Kimberly [name]  Procedure(s) Performed: BREAST LUMPECTOMY WITH RADIOACTIVE SEED LOCALIZATION X2 (Left Breast)     Patient location during evaluation: PACU Anesthesia Type: General Level of consciousness: awake Pain management: pain level controlled Vital Signs Assessment: post-procedure vital signs reviewed and stable Respiratory status: spontaneous breathing Cardiovascular status: stable Postop Assessment: no apparent nausea or vomiting Anesthetic complications: no    Last Vitals:  Vitals:   10/14/18 1608 10/14/18 1623  BP: (!) 147/88 (!) 153/85  Pulse: 71 76  Resp: 13 16  Temp:    SpO2: 99% 100%    Last Pain:  Vitals:   10/14/18 1623  TempSrc:   PainSc: 3    Pain Goal: Patients Stated Pain Goal: 8 (10/14/18 1043)               Huston Foley

## 2018-10-22 ENCOUNTER — Telehealth: Payer: Self-pay | Admitting: Genetics

## 2018-10-22 ENCOUNTER — Other Ambulatory Visit: Payer: Self-pay | Admitting: Internal Medicine

## 2018-10-22 DIAGNOSIS — F419 Anxiety disorder, unspecified: Secondary | ICD-10-CM

## 2018-10-22 DIAGNOSIS — G252 Other specified forms of tremor: Secondary | ICD-10-CM

## 2018-10-22 DIAGNOSIS — I10 Essential (primary) hypertension: Secondary | ICD-10-CM

## 2018-10-22 NOTE — Telephone Encounter (Signed)
Revealed negative genetic testing.  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   This normal result is reassuring and indicates that it is unlikely Kimberly [name]'s cancer is due to a hereditary cause.  It is unlikely that there is an increased risk of another cancer due to a mutation in one of these genes.  However, genetic testing is not perfect, and cannot definitively rule out a hereditary cause.  It will be important for her to keep in contact with genetics to learn if any additional testing may be needed in the future.

## 2018-10-25 ENCOUNTER — Encounter: Payer: Self-pay | Admitting: Genetics

## 2018-10-25 ENCOUNTER — Inpatient Hospital Stay: Payer: Medicaid Other | Attending: Hematology and Oncology | Admitting: Hematology and Oncology

## 2018-10-25 ENCOUNTER — Ambulatory Visit: Payer: Self-pay | Admitting: Genetics

## 2018-10-25 DIAGNOSIS — Z1379 Encounter for other screening for genetic and chromosomal anomalies: Secondary | ICD-10-CM | POA: Insufficient documentation

## 2018-10-25 DIAGNOSIS — D0512 Intraductal carcinoma in situ of left breast: Secondary | ICD-10-CM

## 2018-10-25 NOTE — Progress Notes (Signed)
HPI:  Ms. Kimberly Kimberly [name] was previously seen in the [place] clinic on 10/12/2018 due to a personal and family history of breast cancer and concerns regarding a hereditary predisposition to cancer. Please refer to our prior cancer genetics clinic note for more information regarding Ms. Kimberly Kimberly [name]'s medical, social and family histories, and our assessment and recommendations, at the time. Ms. Kimberly Kimberly [name] recent genetic test results were disclosed to her, as well as recommendations warranted by these results. These results and recommendations are discussed in more detail below.  CANCER HISTORY:    Ductal carcinoma in situ (DCIS) of left breast   09/07/2018 Initial Diagnosis    Bilateral masses and calcifications, right breast benign, left breast UOQ 3.2 cm biopsy revealed intermediate grade to high-grade DCIS ER 100%, PR 100%, 4 o'clock position retroareolar calcifications 2.2 cm biopsy revealed intermediate grade DCIS with [facility] ER 100%, PR 90%, both lesions are 5.5 cm apart, Tis NX stage 0    10/14/2018 Surgery    Left breast lumpectomy x2: 2 foci of DCIS intermediate grade 1.6 cm and 1.1 cm, LCIS, final margins negative.  The smaller DCIS 0.1 cm from inferior margin, ER 100%, PR 90%, Tis NX stage 0      FAMILY HISTORY:  We obtained a detailed, 4-generation family history.  Significant diagnoses are listed below: Family History  Problem Relation Age of Onset  . Breast cancer Mother 37  . Cancer Maternal Grandmother        thinks it was braest, but not sure what type of cancer, dx >50  . Heart disease Paternal Grandfather   . Lung cancer Maternal Aunt   . Colon cancer Neg Hx     Ms. Kimberly Kimberly [name] has a 33 year-old daughter and a 53 year-old son with no hx of cancer.  She has a brother who is 31 with no hx of cancer and a maternal half sister who is in her 65's with no hx of cancer.   Ms. Kimberly Kimberly [name] father: unk, no information known about her paternal family history  Ms. Kimberly Kimberly [name]'s  mother: died at 84 due to breast cancer dx at 9.  Maternal Aunts/Uncles: 1 maternal aunt died at 46 with lung cancer and 1 maternal uncle has no hx of cancer Maternal cousins: no hx of cancer Maternal grandfather: died of a heart attack Maternal grandmother:had cancer dx >50- she thinks it was breast cancer but is not sure.   Ms. Kimberly Kimberly [name] is unaware of previous family history of genetic testing for hereditary cancer risks. Patient's maternal ancestors are of African American/Native american descent, and paternal ancestors are of unk descent. There is no reported Ashkenazi Jewish ancestry. There is no known consanguinity.  GENETIC TEST RESULTS: Genetic testing performed through [facility] Panel reported out on 10/20/2018 showed no pathogenic mutations.   Genes analyzed ATM, BARD1, BRCA1, BRCA2, BRIP1, CDH1, CHEK2, DICER1, EPCAM*, MLH1, MSH2, MSH6, NBN, NF1, PALB2, PMS2, PTEN, RAD50, RAD51C, RAD51D, SMARCA4, STK11, TP53.  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 [id number] ([id number])  The test report will be scanned into EPIC and will be located under the Molecular Pathology section of the Results Review tab. A portion of the result report is included below for reference.     We discussed with Kimberly Kimberly [name] that because current genetic testing is not perfect, it is possible there may be a gene mutation in one of these genes that current testing cannot detect, but that chance is small.  We also discussed, that there could be another gene that has not yet been discovered, or that we have not yet tested, that is responsible for the cancer diagnoses in the family. It is also possible there is a hereditary cause for the cancer in the family that Kimberly Kimberly [name] did not inherit and therefore was not identified in her testing.  Therefore, it is important to remain in touch with cancer genetics in the future so that we can continue to offer Ms.  Kimberly [name] the most up to date genetic testing.   Regarding the VUS in DICER1: At this time, it is unknown if this variant is associated with increased cancer risk or if this is a normal finding, but most variants such as this get reclassified to being inconsequential. It should not be used to make medical management decisions. With time, we suspect the lab will determine the significance of this variant, if any. If we do learn more about it, we will try to contact Kimberly Kimberly [name] to discuss it further. However, it is important to stay in touch with [place] periodically and keep the address and phone number up to date.  ADDITIONAL GENETIC TESTING: We discussed with Kimberly Kimberly [name] that there are other genes that are associated with increased cancer risk that can be analyzed. The laboratories that offer this testing look at these additional genes via a hereditary cancer gene panel. Should Kimberly Kimberly [name] wish to pursue additional genetic testing, we are happy to discuss and coordinate this testing, at any time.    CANCER SCREENING RECOMMENDATIONS: Kimberly Kimberly [name] test result is considered negative (normal).  This means that we have not identified a hereditary cause for her personal and family history of cancer at this time.   While very reassuring, this does not definitively rule out a hereditary predisposition to cancer. It is still possible that there could be genetic mutations that are undetectable by current technology, or genetic mutations in genes that have not been tested or identified to increase cancer risk.  Therefore, it is recommended she continue to follow the cancer management and screening guidelines provided by her oncology and primary healthcare provider. An individual's cancer risk is not determined by genetic test results alone.  Overall cancer risk assessment includes additional factors such as personal medical history, family history, etc.  These should be used to make a personalized plan for cancer  prevention and surveillance.    RECOMMENDATIONS FOR FAMILY MEMBERS:  Relatives in this family might be at some increased risk of developing cancer, over the general population risk, simply due to the family history of cancer.  We recommended women in this family have a yearly mammogram beginning at age 35, or 29 years younger than the earliest onset of cancer, an annual clinical breast exam, and perform monthly breast self-exams. Women in this family should also have a gynecological exam as recommended by their primary provider. All family members should have a colonoscopy by age 27 (or as directed by their doctors).  All family members should inform their physicians about the family history of cancer so their doctors can make the most appropriate screening recommendations for them.   FOLLOW-UP: Lastly, we discussed with Ms. Mulhall that cancer genetics is a rapidly advancing field and it is possible that new genetic tests will be appropriate for her and/or her family members in the future. We encouraged her to remain in contact with cancer genetics on an annual basis so we can update her personal and family histories and let  her know of advances in cancer genetics that may benefit this family.   Our contact number was provided. Ms. Taff questions were answered to her satisfaction, and she knows she is welcome to call us at anytime with additional questions or concerns.   Kimberly Luz, MS, [facility] Certified Genetic Counselor [email or web address]

## 2018-10-25 NOTE — Progress Notes (Signed)
Patient Care Team: Ladell Pier, MD as PCP - General (Internal Medicine) Gala Romney Cristopher Estimable, MD as Consulting Physician (Gastroenterology) Erroll Luna, MD as Consulting Physician (General Surgery) Nicholas Lose, MD as Consulting Physician (Hematology and Oncology) Kyung Rudd, MD as Consulting Physician (Radiation Oncology)  DIAGNOSIS:  Encounter Diagnosis  Name Primary?  . Ductal carcinoma in situ (DCIS) of left breast     SUMMARY OF ONCOLOGIC HISTORY:   Ductal carcinoma in situ (DCIS) of left breast   09/07/2018 Initial Diagnosis    Bilateral masses and calcifications, right breast benign, left breast UOQ 3.2 cm biopsy revealed intermediate grade to high-grade DCIS ER 100%, PR 100%, 4 o'clock position retroareolar calcifications 2.2 cm biopsy revealed intermediate grade DCIS with [facility] ER 100%, PR 90%, both lesions are 5.5 cm apart, Tis NX stage 0    10/14/2018 Surgery    Left breast lumpectomy x2: 2 foci of DCIS intermediate grade 1.6 cm and 1.1 cm, LCIS, final margins negative.  The smaller DCIS 0.1 cm from inferior margin, ER 100%, PR 90%, Tis NX stage 0     CHIEF COMPLIANT: Follow-up after left lumpectomy to discuss pathology report  INTERVAL HISTORY: Kimberly [name] is a 63 year old above-mentioned history of left breast DCIS underwent lumpectomy x2 and is here today to discuss the pathology report.  It appears that the 1.6 cm tumor has negative margins but the 1.1 cm tumor has a close inferior margin.  She is recovering very well from recent surgery.  REVIEW OF SYSTEMS:   Constitutional: Denies fevers, chills or abnormal weight loss Eyes: Denies blurriness of vision Ears, nose, mouth, throat, and face: Denies mucositis or sore throat Respiratory: Denies cough, dyspnea or wheezes Cardiovascular: Denies palpitation, chest discomfort Gastrointestinal:  Denies nausea, heartburn or change in bowel habits Skin: Denies abnormal skin rashes Lymphatics: Denies new  lymphadenopathy or easy bruising Neurological:Denies numbness, tingling or new weaknesses Behavioral/Psych: Mood is stable, no new changes  Extremities: No lower extremity edema Breast: Recent double lumpectomies left breast All other systems were reviewed with the patient and are negative.  I have reviewed the past medical history, past surgical history, social history and family history with the patient and they are unchanged from previous note.  ALLERGIES:  is allergic to codeine.  MEDICATIONS:  Current Outpatient Medications  Medication Sig Dispense Refill  . atenolol (TENORMIN) 25 MG tablet Take 1 tablet (25 mg total) by mouth daily. 90 tablet 3  . busPIRone (BUSPAR) 15 MG tablet Take 1 tablet (15 mg total) by mouth 2 (two) times daily. MUST MAKE APPT FOR FURTHER REFILLS 60 tablet 0  . FLUoxetine (PROZAC) 20 MG tablet Take 1 tablet (20 mg total) by mouth daily. MUST MAKE APPT FOR FURTHER REFILLS 30 tablet 0  . Multiple Vitamin (MULTIVITAMIN WITH MINERALS) TABS tablet Take 1 tablet by mouth daily.     . traZODone (DESYREL) 50 MG tablet Take 1 tablet (50 mg total) by mouth at bedtime as needed for sleep. MUST MAKE APPT FOR FURTHER REFILLS (Patient not taking: Reported on 09/27/2018) 30 tablet 0   No current facility-administered medications for this visit.     PHYSICAL EXAMINATION: ECOG PERFORMANCE STATUS: 1 - Symptomatic but completely ambulatory  Vitals:   10/25/18 1424  BP: 133/72  Pulse: 72  Resp: 18  Temp: 98 F (36.7 C)  SpO2: 100%   Filed Weights   10/25/18 1424  Weight: 166 lb (75.3 kg)    GENERAL:alert, no distress and comfortable SKIN: skin color,  texture, turgor are normal, no rashes or significant lesions EYES: normal, Conjunctiva are pink and non-injected, sclera clear OROPHARYNX:no exudate, no erythema and lips, buccal mucosa, and tongue normal  NECK: supple, thyroid normal size, non-tender, without nodularity LYMPH:  no palpable lymphadenopathy in the  cervical, axillary or inguinal LUNGS: clear to auscultation and percussion with normal breathing effort HEART: regular rate & rhythm and no murmurs and no lower extremity edema ABDOMEN:abdomen soft, non-tender and normal bowel sounds MUSCULOSKELETAL:no cyanosis of digits and no clubbing  NEURO: alert & oriented x 3 with fluent speech, no focal motor/sensory deficits EXTREMITIES: No lower extremity edema   LABORATORY DATA:  I have reviewed the data as listed CMP Latest Ref Rng & Units 10/08/2018 09/15/2018 08/03/2018  Glucose 70 - 99 mg/dL 104(H) 104(H) 164(H)  BUN 8 - 23 mg/dL 15 15 10   Creatinine 0.44 - 1.00 mg/dL 0.95 0.92 0.87  Sodium 135 - 145 mmol/L 139 141 139  Potassium 3.5 - 5.1 mmol/L 4.6 4.8 3.6  Chloride 98 - 111 mmol/L 107 109 107  CO2 22 - 32 mmol/L 25 23 24   Calcium 8.9 - 10.3 mg/dL 9.9 9.8 9.3  Total Protein 6.5 - 8.1 g/dL 7.8 7.7 -  Total Bilirubin 0.3 - 1.2 mg/dL 0.4 0.4 -  Alkaline Phos 38 - 126 U/L 74 87 -  AST 15 - 41 U/L 19 16 -  ALT 0 - 44 U/L 15 11 -    Lab Results  Component Value Date   WBC 6.1 10/08/2018   HGB 14.7 10/08/2018   HCT 45.0 10/08/2018   MCV 93.0 10/08/2018   PLT 473 (H) 10/08/2018   NEUTROABS 3.3 10/08/2018    ASSESSMENT & PLAN:  Ductal carcinoma in situ (DCIS) of left breast 10/14/2018: Left breast lumpectomy x2: 2 foci of DCIS intermediate grade 1.6 cm and 1.1 cm, LCIS, final margins negative.  The smaller DCIS 0.1 cm from inferior margin, ER 100%, PR 90%, Tis NX stage 0  Pathology counseling: I discussed the final pathology report of the patient provided  a copy of this report. I discussed the margins. We also discussed the final staging along with previously performed ER/PR.  Treatment plan: 1.  Adjuvant radiation therapy 2.  Followed by adjuvant antiestrogen therapy with tamoxifen x5 years  Follow-up at the end of radiation to begin antiestrogen therapy   No orders of the defined types were placed in this encounter.  The  patient has a good understanding of the overall plan. she agrees with it. she will call with any problems that may develop before the next visit here.   Harriette Ohara, MD 10/25/18

## 2018-10-25 NOTE — Assessment & Plan Note (Signed)
10/14/2018: Left breast lumpectomy x2: 2 foci of DCIS intermediate grade 1.6 cm and 1.1 cm, LCIS, final margins negative.  The smaller DCIS 0.1 cm from inferior margin, ER 100%, PR 90%, Tis NX stage 0  Pathology counseling: I discussed the final pathology report of the patient provided  a copy of this report. I discussed the margins. We also discussed the final staging along with previously performed ER/PR.  Treatment plan: 1.  Adjuvant radiation therapy 2.  Followed by adjuvant antiestrogen therapy with tamoxifen x5 years  Follow-up at the end of radiation to begin antiestrogen therapy

## 2018-10-28 ENCOUNTER — Telehealth: Payer: Self-pay | Admitting: Internal Medicine

## 2018-10-28 ENCOUNTER — Other Ambulatory Visit: Payer: Self-pay | Admitting: Pharmacist

## 2018-10-28 DIAGNOSIS — F419 Anxiety disorder, unspecified: Secondary | ICD-10-CM

## 2018-10-28 DIAGNOSIS — I10 Essential (primary) hypertension: Secondary | ICD-10-CM

## 2018-10-28 DIAGNOSIS — G252 Other specified forms of tremor: Secondary | ICD-10-CM

## 2018-10-28 MED ORDER — ATENOLOL 25 MG PO TABS: 25.0000 mg | ORAL_TABLET | Freq: Every day | ORAL | 0 refills | Status: DC

## 2018-10-28 MED ORDER — BUSPIRONE HCL 15 MG PO TABS: 15.0000 mg | ORAL_TABLET | Freq: Two times a day (BID) | ORAL | 0 refills | Status: DC

## 2018-10-28 MED FILL — busPIRone HCL 15 MG TABS: 15 | 30 days supply | Qty: 60 | Fill #0

## 2018-10-28 MED FILL — ?ATENOLOL 25MG TABLET: 25 | 30 days supply | Qty: 30 | Fill #0

## 2018-10-28 NOTE — Telephone Encounter (Signed)
Lurena Joiner has approved medications for a 30 day supply.

## 2018-10-28 NOTE — Telephone Encounter (Signed)
Patient would like to speak with someone regarding her medications. Patient states she is about to start going through a procedure for the next 4 weeks everyday and is not able to setup an appt at the moment due to her upcoming schedule however, patient states she needs no BP medication. Please follow up.

## 2018-10-28 NOTE — Telephone Encounter (Signed)
Patient has schedule an appt for 02/10. Patient would like to see if she can get her   1) Medication(s) Requested (by name): atenolol 2) Pharmacy of Choice: [facility]

## 2018-11-02 ENCOUNTER — Other Ambulatory Visit: Payer: Self-pay

## 2018-11-02 ENCOUNTER — Encounter: Payer: Self-pay | Admitting: Radiation Oncology

## 2018-11-02 ENCOUNTER — Ambulatory Visit
Admission: RE | Admit: 2018-11-02 | Discharge: 2018-11-02 | Disposition: A | Discharge status: Home / Self Care | Payer: Medicaid Other | Source: Ambulatory Visit | Attending: Radiation Oncology | Admitting: Radiation Oncology

## 2018-11-02 ENCOUNTER — Ambulatory Visit: Admission: RE | Admit: 2018-11-02 | Payer: Medicaid Other | Source: Ambulatory Visit | Admitting: Radiation Oncology

## 2018-11-02 VITALS — BP 132/83 | HR 70 | Temp 98.1°F | Resp 18 | Ht 65.0 in | Wt 167.1 lb

## 2018-11-02 DIAGNOSIS — D0512 Intraductal carcinoma in situ of left breast: Secondary | ICD-10-CM

## 2018-11-02 DIAGNOSIS — F329 Major depressive disorder, single episode, unspecified: Secondary | ICD-10-CM | POA: Diagnosis not present

## 2018-11-02 DIAGNOSIS — I10 Essential (primary) hypertension: Secondary | ICD-10-CM | POA: Diagnosis not present

## 2018-11-02 DIAGNOSIS — Z803 Family history of malignant neoplasm of breast: Secondary | ICD-10-CM | POA: Diagnosis not present

## 2018-11-02 DIAGNOSIS — Z79899 Other long term (current) drug therapy: Secondary | ICD-10-CM | POA: Diagnosis not present

## 2018-11-02 DIAGNOSIS — Z87442 Personal history of urinary calculi: Secondary | ICD-10-CM | POA: Diagnosis not present

## 2018-11-02 DIAGNOSIS — Z801 Family history of malignant neoplasm of trachea, bronchus and lung: Secondary | ICD-10-CM | POA: Insufficient documentation

## 2018-11-02 DIAGNOSIS — Z170 Estrogen receptor positive status [ER+]: Secondary | ICD-10-CM | POA: Insufficient documentation

## 2018-11-02 NOTE — Progress Notes (Signed)
Radiation Oncology         [phone] ________________________________  Name: Kimberly [name]        MRN: [id number]  Date of Service: 11/02/2018 DOB: 03/22/1956  WU:[id number], Dalbert Batman, MD  Nicholas Lose, MD     REFERRING PHYSICIAN: Nicholas Lose, MD   DIAGNOSIS: The encounter diagnosis was Ductal carcinoma in situ (DCIS) of left breast.   HISTORY OF PRESENT ILLNESS: Kimberly [name] is a 63 y.o. female originally seen in the multidisciplinary breast clinic for a new diagnosis of left breast cancer. The patient was noted to have bilateral masses and calcifications on a diagnostic mammogram as she's been followed for benign appearing breast masses in the left breast. She had some features in the right breast there was an area of calcification measuring 2 x 6 x 8 mm in the lower central breast. In the left, there were stable masses at 10:00 11:00, 1:30, and 4:00. She had an 8 mm group of indeterminate microcalcifications in the upper outer left breast measuring 10 x 10 x 32 mm, and at 4:00 in the retroareolar region there was a group of calcifications measuring 2.2 x 1.4 x 1 cm as well. She underwent ultrasound that did not identify adenopathy. She underwent left stereotactic biopsy on 09/01/18 of the UOQ calcifications that revealed an intermediate to high grade DCIS, ER/PR positive, and at the 4:00 retroareolar site, and intermediate grade DCIS with [facility], also ER/PR positive. She subsequently underwent left lumpectomy x2 on 10/14/2018 that revealed intermediate grade DCIS of the left breast measuring 16 mm, and another measuring 11 mm. Her smaller lesion was <1 mm from the inferior margin. She comes today to discuss recommendations for adjuvant radiotherapy.    PREVIOUS RADIATION THERAPY: No   PAST MEDICAL HISTORY:  Past Medical History:  Diagnosis Date  . Anxiety 1995  . Cancer [facility])    breast left  . Depression 1995  . Family history of breast cancer   . Family history of lung cancer    . History of kidney stones   . History of rheumatic fever as a child   . Hypertension Dx Dec 2015  . Panic attack 1995  . Tremor        PAST SURGICAL HISTORY: Past Surgical History:  Procedure Laterality Date  . APPENDECTOMY    . bowel obstruction    . BREAST LUMPECTOMY WITH RADIOACTIVE SEED LOCALIZATION Left 10/14/2018   Procedure: BREAST LUMPECTOMY WITH RADIOACTIVE SEED LOCALIZATION X2;  Surgeon: Erroll Luna, MD;  Location: [place];  Service: General;  Laterality: Left;  . CHOLECYSTECTOMY    . COLONOSCOPY WITH PROPOFOL N/A 08/05/2018   Procedure: COLONOSCOPY WITH PROPOFOL;  Surgeon: Daneil Dolin, MD;  [facility];  Service: Endoscopy;  Laterality: N/A;  12:45pm  . TONSILLECTOMY       FAMILY HISTORY:  Family History  Problem Relation Age of Onset  . Breast cancer Mother 74  . Cancer Maternal Grandmother        thinks it was braest, but not sure what type of cancer, dx >50  . Heart disease Paternal Grandfather   . Lung cancer Maternal Aunt   . Colon cancer Neg Hx      SOCIAL HISTORY:  reports that she has never smoked. She has never used smokeless tobacco. She reports that she does not drink alcohol or use drugs. She lives in [place] and looks after her 5 grandchildren.   ALLERGIES: Codeine   MEDICATIONS:  Current Outpatient  Medications  Medication Sig Dispense Refill  . atenolol (TENORMIN) 25 MG tablet Take 1 tablet (25 mg total) by mouth daily. 30 tablet 0  . busPIRone (BUSPAR) 15 MG tablet Take 1 tablet (15 mg total) by mouth 2 (two) times daily. MUST MAKE APPT FOR FURTHER REFILLS 60 tablet 0  . ibuprofen (ADVIL,MOTRIN) 200 MG tablet Take 200 mg by mouth every 6 (six) hours as needed.    . Multiple Vitamin (MULTIVITAMIN WITH MINERALS) TABS tablet Take 1 tablet by mouth daily.     Marland Kitchen FLUoxetine (PROZAC) 20 MG tablet Take 1 tablet (20 mg total) by mouth daily. MUST MAKE APPT FOR FURTHER REFILLS (Patient not taking: Reported on 11/02/2018) 30 tablet 0   . traZODone (DESYREL) 50 MG tablet Take 1 tablet (50 mg total) by mouth at bedtime as needed for sleep. MUST MAKE APPT FOR FURTHER REFILLS (Patient not taking: Reported on 09/27/2018) 30 tablet 0   No current facility-administered medications for this encounter.      REVIEW OF SYSTEMS: On review of systems, the patient reports that she is doing well overall. She denies any chest pain, shortness of breath, cough, fevers, chills, night sweats, unintended weight changes. She has a small amount of drainage on dressings that she's noted in the last few days. She denies any bowel or bladder disturbances, and denies abdominal pain, nausea or vomiting. She denies any new musculoskeletal or joint aches or pains. A complete review of systems is obtained and is otherwise negative.     PHYSICAL EXAM:  Wt Readings from Last 3 Encounters:  11/02/18 167 lb 2 oz (75.8 kg)  10/25/18 166 lb (75.3 kg)  10/14/18 165 lb (74.8 kg)   Temp Readings from Last 3 Encounters:  11/02/18 98.1 F (36.7 C) (Oral)  10/25/18 98 F (36.7 C) (Oral)  10/14/18 98.6 F (37 C)   BP Readings from Last 3 Encounters:  11/02/18 132/83  10/25/18 133/72  10/14/18 (!) 153/85   Pulse Readings from Last 3 Encounters:  11/02/18 70  10/25/18 72  10/14/18 76     In general this is a well appearing African American female in no acute distress. She is alert and oriented x4 and appropriate throughout the examination. HEENT reveals that the patient is normocephalic, atraumatic. EOMs are intact. Cardiopulmonary assessment is negative for acute distress and she exhibits normal effort. The left lumpectomy incision site has about 6-7 mm of separation along the lateral aspect. No erythema is noted. A gauze dressing is changed and 2x2 gauze is placed.    ECOG = 0  0 - Asymptomatic (Fully active, able to carry on all predisease activities without restriction)  1 - Symptomatic but completely ambulatory (Restricted in physically  strenuous activity but ambulatory and able to carry out work of a light or sedentary nature. For example, light housework, office work)  2 - Symptomatic, <50% in bed during the day (Ambulatory and capable of all self care but unable to carry out any work activities. Up and about more than 50% of waking hours)  3 - Symptomatic, >50% in bed, but not bedbound (Capable of only limited self-care, confined to bed or chair 50% or more of waking hours)  4 - Bedbound (Completely disabled. Cannot carry on any self-care. Totally confined to bed or chair)  5 - Death   Eustace Pen MM, Creech RH, Tormey DC, et al. [phone]). "Toxicity and response criteria of the [facility]". [place] Oncol. 5 (6): [phone]  LABORATORY DATA:  Lab Results  Component Value Date   WBC 6.1 10/08/2018   HGB 14.7 10/08/2018   HCT 45.0 10/08/2018   MCV 93.0 10/08/2018   PLT 473 (H) 10/08/2018   Lab Results  Component Value Date   NA 139 10/08/2018   K 4.6 10/08/2018   CL 107 10/08/2018   CO2 25 10/08/2018   Lab Results  Component Value Date   ALT 15 10/08/2018   AST 19 10/08/2018   ALKPHOS 74 10/08/2018   BILITOT 0.4 10/08/2018      RADIOGRAPHY: Mm Breast Surgical Specimen  Result Date: 10/14/2018 CLINICAL DATA:  Status post breast conservation surgery today after earlier radioactive seed localization. EXAM: SPECIMEN RADIOGRAPH OF THE LEFT BREAST COMPARISON:  Previous exam(s). FINDINGS: Status post excision of the left breast. The radioactive seed and coil shaped biopsy marker clip are present, completely intact, and were marked for pathology. The additional radioactive seed corresponding to the subareolar ultrasound-guided LEFT breast biopsy is present and marked for pathology, however, the adjacent ribbon shaped clip is not within the specimen. IMPRESSION: Specimen radiograph of the left breast. The absence of the ribbon shaped clip and associated calcifications, corresponding to the subareolar  ultrasound-guided biopsy site, was discussed with the OR staff during the procedure. Electronically Signed   By: Franki Cabot M.D.   On: 10/14/2018 14:43   Mm Lt Radioactive Seed Loc Mammo Guide  Result Date: 10/11/2018 CLINICAL DATA:  64 year old female presenting for radioactive seed localization of 2 sites in the left breast prior to lumpectomy. EXAM: MAMMOGRAPHIC GUIDED RADIOACTIVE SEED LOCALIZATION OF THE LEFT BREAST COMPARISON:  Previous exam(s). FINDINGS: Patient presents for radioactive seed localization prior to . I met with the patient and we discussed the procedure of seed localization including benefits and alternatives. We discussed the high likelihood of a successful procedure. We discussed the risks of the procedure including infection, bleeding, tissue injury and further surgery. We discussed the low dose of radioactivity involved in the procedure. Informed, written consent was given. The usual time-out protocol was performed immediately prior to the procedure. Using mammographic guidance, sterile technique, 1% lidocaine and an I-125 radioactive seed, the coil shaped biopsy marking clip in the upper-outer quadrant of the left breast was localized using a lateral approach. The follow-up mammogram images confirm the seed in the expected location and were marked for Dr. Brantley Stage. Follow-up survey of the patient confirms presence of the radioactive seed. Order number of I-125 seed:  [id number]. Total activity:  0.623 millicuries reference Date: 09/27/2018 ------------------------------------------------------------------------------------------------ Using mammographic guidance, sterile technique, 1% lidocaine and an I-125 radioactive seed, the calcifications in the lateral retroareolar left breast were localized using a superior approach. The follow-up mammogram images confirm the seed in the expected location and were marked for Dr. Brantley Stage. There is a 2 cm span of these calcifications, and  therefore the seed was placed centrally within the group. The ribbon shaped biopsy marking clip lies 9-10 mm medial to the seed. Follow-up survey of the patient confirms presence of the radioactive seed. Order number of I-125 seed:  [id number]. Total activity:  6.160 millicuries reference Date: 09/03/2018 The patient tolerated the procedure well and was released from the [place]. She was given instructions regarding seed removal. IMPRESSION: 1. Radioactive seed localization of the upper-outer quadrant of the left breast. No apparent complications. 2. A second radioactive seed localization was performed of the calcifications in the retroareolar lateral left breast. The seed was intentionally position in the center of the 2  cm span of calcifications, and lies 8-9 mm lateral to the ribbon shaped biopsy marking clip. An Epic email was sent to Dr. Brantley Stage to describe the placement of the retreoareolar seed, as I was unable to reach him by pager the morning of this procedure. Electronically Signed   By: Ammie Ferrier M.D.   On: 10/11/2018 14:15   Mm Lt Rad Seed Ea Add Lesion Loc Mammo  Result Date: 10/11/2018 CLINICAL DATA:  63 year old female presenting for radioactive seed localization of 2 sites in the left breast prior to lumpectomy. EXAM: MAMMOGRAPHIC GUIDED RADIOACTIVE SEED LOCALIZATION OF THE LEFT BREAST COMPARISON:  Previous exam(s). FINDINGS: Patient presents for radioactive seed localization prior to . I met with the patient and we discussed the procedure of seed localization including benefits and alternatives. We discussed the high likelihood of a successful procedure. We discussed the risks of the procedure including infection, bleeding, tissue injury and further surgery. We discussed the low dose of radioactivity involved in the procedure. Informed, written consent was given. The usual time-out protocol was performed immediately prior to the procedure. Using mammographic guidance, sterile  technique, 1% lidocaine and an I-125 radioactive seed, the coil shaped biopsy marking clip in the upper-outer quadrant of the left breast was localized using a lateral approach. The follow-up mammogram images confirm the seed in the expected location and were marked for Dr. Brantley Stage. Follow-up survey of the patient confirms presence of the radioactive seed. Order number of I-125 seed:  [id number]. Total activity:  2.951 millicuries reference Date: 09/27/2018 ------------------------------------------------------------------------------------------------ Using mammographic guidance, sterile technique, 1% lidocaine and an I-125 radioactive seed, the calcifications in the lateral retroareolar left breast were localized using a superior approach. The follow-up mammogram images confirm the seed in the expected location and were marked for Dr. Brantley Stage. There is a 2 cm span of these calcifications, and therefore the seed was placed centrally within the group. The ribbon shaped biopsy marking clip lies 9-10 mm medial to the seed. Follow-up survey of the patient confirms presence of the radioactive seed. Order number of I-125 seed:  [id number]. Total activity:  0.160 millicuries reference Date: 09/03/2018 The patient tolerated the procedure well and was released from the [place]. She was given instructions regarding seed removal. IMPRESSION: 1. Radioactive seed localization of the upper-outer quadrant of the left breast. No apparent complications. 2. A second radioactive seed localization was performed of the calcifications in the retroareolar lateral left breast. The seed was intentionally position in the center of the 2 cm span of calcifications, and lies 8-9 mm lateral to the ribbon shaped biopsy marking clip. An Epic email was sent to Dr. Brantley Stage to describe the placement of the retreoareolar seed, as I was unable to reach him by pager the morning of this procedure. Electronically Signed   By: Ammie Ferrier M.D.    On: 10/11/2018 14:15       IMPRESSION/PLAN: 1. Multifocal ER/PR positive intermediate grade DCIS of the left breast. Dr. Lisbeth Renshaw discusses the final pathology findings and reviews the nature of noninvasive breast disease. We reviewed the rationale for radiotherapy and she has a bit more healing to do prior to proceeding. We discussed the risks, benefits, short, and long term effects of radiotherapy, and the patient is interested in proceeding. Dr. Lisbeth Renshaw discusses the delivery and logistics of radiotherapy and recommends a course of 4 weeks of radiotherapy. Written consent is obtained and placed in the chart, a copy was provided to the patient. She will return on 11/12/2018 at  10 am for simulation.   In a visit lasting 25 minutes, greater than 50% of the time was spent face to face discussing her case, and coordinating the patient's care.   The above documentation reflects my direct findings during this shared patient visit. Please see the separate note by Dr. Lisbeth Renshaw on this date for the remainder of the patient's plan of care.    Carola Rhine, PAC

## 2018-11-03 ENCOUNTER — Encounter: Payer: Self-pay | Admitting: General Practice

## 2018-11-03 NOTE — Addendum Note (Signed)
Encounter addended by: Heywood Footman, RN on: 11/03/2018 9:52 AM  Actions taken: Charge Capture section accepted

## 2018-11-03 NOTE — Progress Notes (Signed)
[place] Psychosocial Distress Screening Clinical Social Work  Clinical Social Work was referred by distress screening protocol.  The patient scored a 7 on the Psychosocial Distress Thermometer which indicates moderate distress. Clinical Social Worker contacted patient by phone to assess for distress and other psychosocial needs. Is concerned about being able to care for her grandchildren (ages 43 - 77).  May need appointments scheduled during the school day as patient needs to be available to afterschool care.  Transportation has been a concern for patient as she does not drive.  CSW will refer to Transportation Coordinator for additional help - does have church members who help w rides to MD appts and church activities.  Has [place].  Can access pharmacy and primary care at [facility].  ONCBCN DISTRESS SCREENING 11/02/2018  Distress experienced in past week (1-10) 7  Practical problem type Childcare  Family Problem type Children  Emotional problem type Nervousness/Anxiety;Adjusting to illness;Boredom  Spiritual/Religous concerns type   Physical Problem type Sleep/insomnia  Physician notified of physical symptoms No  Referral to clinical psychology No  Referral to clinical social work Yes  Referral to dietition No  Referral to financial advocate No  Referral to support programs No  Referral to palliative care No    Clinical Social Worker follow up needed: No.  If yes, follow up plan:  Beverely Pace, [place], LCSW Clinical Social Worker Phone:  [phone]

## 2018-11-04 ENCOUNTER — Telehealth: Payer: Self-pay | Admitting: Radiation Oncology

## 2018-11-04 NOTE — Telephone Encounter (Signed)
Called and spoke with patient regarding the transportation program.   She told me that her church members were going to try to bring her but that wouldn't always be consistent. I told her I would meet with her during her CT Sim and give her more info. I'm going to schedule rides for her radiation and she will cancel it out if she has a church member bringing her.

## 2018-11-05 ENCOUNTER — Telehealth: Payer: Self-pay

## 2018-11-05 NOTE — Telephone Encounter (Signed)
Message received from Edwyna Shell, [place] noting that the patient would like to move up her appointment with the provider scheduled for 11/22/2018 as she will be starting radiation and is concerned about getting medication refills.  Call placed to the patient and the appointment was re-scheduled with Dr Wynetta Emery for 11/11/2018 @ 1050.    Update provided to A. Cunningham, LCSW.

## 2018-11-11 ENCOUNTER — Ambulatory Visit: Payer: Medicaid Other | Attending: Internal Medicine | Admitting: Internal Medicine

## 2018-11-11 ENCOUNTER — Encounter: Payer: Self-pay | Admitting: Internal Medicine

## 2018-11-11 VITALS — BP 135/80 | HR 60 | Temp 98.2°F | Resp 16 | Wt 171.4 lb

## 2018-11-11 DIAGNOSIS — F419 Anxiety disorder, unspecified: Secondary | ICD-10-CM | POA: Diagnosis not present

## 2018-11-11 DIAGNOSIS — I10 Essential (primary) hypertension: Secondary | ICD-10-CM | POA: Insufficient documentation

## 2018-11-11 DIAGNOSIS — Z8249 Family history of ischemic heart disease and other diseases of the circulatory system: Secondary | ICD-10-CM | POA: Insufficient documentation

## 2018-11-11 DIAGNOSIS — D0512 Intraductal carcinoma in situ of left breast: Secondary | ICD-10-CM | POA: Insufficient documentation

## 2018-11-11 DIAGNOSIS — Z79899 Other long term (current) drug therapy: Secondary | ICD-10-CM | POA: Insufficient documentation

## 2018-11-11 DIAGNOSIS — Z885 Allergy status to narcotic agent status: Secondary | ICD-10-CM | POA: Diagnosis not present

## 2018-11-11 DIAGNOSIS — Z801 Family history of malignant neoplasm of trachea, bronchus and lung: Secondary | ICD-10-CM | POA: Insufficient documentation

## 2018-11-11 DIAGNOSIS — Z803 Family history of malignant neoplasm of breast: Secondary | ICD-10-CM | POA: Diagnosis not present

## 2018-11-11 DIAGNOSIS — G252 Other specified forms of tremor: Secondary | ICD-10-CM | POA: Insufficient documentation

## 2018-11-11 MED ORDER — AMLODIPINE BESYLATE 5 MG PO TABS: 5.0000 mg | ORAL_TABLET | Freq: Every day | ORAL | 3 refills | Status: DC

## 2018-11-11 MED ORDER — BUSPIRONE HCL 15 MG PO TABS: 15.0000 mg | ORAL_TABLET | Freq: Two times a day (BID) | ORAL | 3 refills | Status: DC

## 2018-11-11 MED ORDER — ATENOLOL 25 MG PO TABS: 25.0000 mg | ORAL_TABLET | Freq: Every day | ORAL | 3 refills | Status: DC

## 2018-11-11 MED FILL — AMLODIPINE BESYLATE 5 MG TA: 5 | 30 days supply | Qty: 30 | Fill #0

## 2018-11-11 NOTE — Progress Notes (Signed)
Patient ID: Kimberly [name], female    DOB: 10-27-1955  MRN: [id number]  CC: Hypertension   Subjective: Kimberly [name] is a 63 y.o. female who presents for chronic ds management Her concerns today include:  Pt with hx of anxiety, HTN and insomnia.  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  Had lumpectomy and about to start XRT.    HTN:  No device to check but I was able to look at blood pressure readings when she saw her other physicians within the health system recently Limits salt in foods. Reports compliance with atenolol No CP/SOB/LE Reports good appetite.  Walks her dogs every evening for 30 mins twice a day.   Anxiety:  Out of Prozac for a while. She does not feel she needs to be back on it.  Doing Okay on Buspar and requests refill on this.  Patient Active Problem List   Diagnosis Date Noted  . Genetic testing 10/25/2018  . Family history of breast cancer   . Family history of lung cancer   . Ductal carcinoma in situ (DCIS) of left breast 09/07/2018  . Intention tremor 10/15/2017  . Diabetes mellitus screening 06/10/2017  . Breast nodule 03/02/2016  . Seasonal allergies 01/07/2016  . Right shoulder pain 09/28/2015  . Insomnia 07/06/2015  . Tinea pedis of both feet 07/06/2015  . Nocturia 08/25/2014  . Chronic anxiety 08/25/2014  . Heme positive stool 08/16/2014  . HTN (hypertension) 08/16/2014     Current Outpatient Medications on File Prior to Visit  Medication Sig Dispense Refill  . atenolol (TENORMIN) 25 MG tablet Take 1 tablet (25 mg total) by mouth daily. 30 tablet 0  . busPIRone (BUSPAR) 15 MG tablet Take 1 tablet (15 mg total) by mouth 2 (two) times daily. MUST MAKE APPT FOR FURTHER REFILLS 60 tablet 0  . FLUoxetine (PROZAC) 20 MG tablet Take 1 tablet (20 mg total) by mouth daily. MUST MAKE APPT FOR FURTHER REFILLS (Patient not taking: Reported on 11/02/2018) 30 tablet 0  . ibuprofen (ADVIL,MOTRIN) 200 MG tablet Take 200 mg by mouth every 6  (six) hours as needed.    . Multiple Vitamin (MULTIVITAMIN WITH MINERALS) TABS tablet Take 1 tablet by mouth daily.     . traZODone (DESYREL) 50 MG tablet Take 1 tablet (50 mg total) by mouth at bedtime as needed for sleep. MUST MAKE APPT FOR FURTHER REFILLS (Patient not taking: Reported on 09/27/2018) 30 tablet 0   No current facility-administered medications on file prior to visit.     Allergies  Allergen Reactions  . Codeine Other (See Comments)    Cold sweats    Social History   Socioeconomic History  . Marital status: Single    Spouse name: Not on file  . Number of children: Not on file  . Years of education: Not on file  . Highest education level: Not on file  Occupational History  . Not on file  Social Needs  . Financial resource strain: Not on file  . Food insecurity:    Worry: Not on file    Inability: Not on file  . Transportation needs:    Medical: Not on file    Non-medical: Not on file  Tobacco Use  . Smoking status: Never Smoker  . Smokeless tobacco: Never Used  Substance and Sexual Activity  . Alcohol use: No    Alcohol/week: 0.0 standard drinks  . Drug use: No  . Sexual activity: Yes  Birth control/protection: Post-menopausal  Lifestyle  . Physical activity:    Days per week: Not on file    Minutes per session: Not on file  . Stress: Not on file  Relationships  . Social connections:    Talks on phone: Not on file    Gets together: Not on file    Attends religious service: Not on file    Active member of club or organization: Not on file    Attends meetings of clubs or organizations: Not on file    Relationship status: Not on file  . Intimate partner violence:    Fear of current or ex partner: Not on file    Emotionally abused: Not on file    Physically abused: Not on file    Forced sexual activity: Not on file  Other Topics Concern  . Not on file  Social History Narrative  . Not on file    Family History  Problem Relation Age of Onset   . Breast cancer Mother 3  . Cancer Maternal Grandmother        thinks it was braest, but not sure what type of cancer, dx >50  . Heart disease Paternal Grandfather   . Lung cancer Maternal Aunt   . Colon cancer Neg Hx     Past Surgical History:  Procedure Laterality Date  . APPENDECTOMY    . bowel obstruction    . BREAST LUMPECTOMY WITH RADIOACTIVE SEED LOCALIZATION Left 10/14/2018   Procedure: BREAST LUMPECTOMY WITH RADIOACTIVE SEED LOCALIZATION X2;  Surgeon: Erroll Luna, MD;  Location: [facility];  Service: General;  Laterality: Left;  . CHOLECYSTECTOMY    . COLONOSCOPY WITH PROPOFOL N/A 08/05/2018   Procedure: COLONOSCOPY WITH PROPOFOL;  Surgeon: Daneil Dolin, MD;  [facility];  Service: Endoscopy;  Laterality: N/A;  12:45pm  . TONSILLECTOMY      ROS: Review of Systems Negative except as above PHYSICAL EXAM: BP 135/80   Pulse 60   Temp 98.2 F (36.8 C) (Oral)   Resp 16   Wt 171 lb 6.4 oz (77.7 kg)   SpO2 98%   BMI 28.52 kg/m   Physical Exam General appearance - alert, well appearing, and in no distress Mental status - normal mood, behavior, speech, dress, motor activity, and thought processes Neck - supple, no significant adenopathy Chest - clear to auscultation, no wheezes, rales or rhonchi, symmetric air entry Heart - normal rate, regular rhythm, normal S1, S2, no murmurs, rubs, clicks or gallops Extremities - peripheral pulses normal, no pedal edema, no clubbing or cyanosis   ASSESSMENT AND PLAN: 1. Essential hypertension Not at goal.  Continue atenolol.  Add low-dose Norvasc. - atenolol (TENORMIN) 25 MG tablet; Take 1 tablet (25 mg total) by mouth daily.  Dispense: 90 tablet; Refill: 3 - amLODipine (NORVASC) 5 MG tablet; Take 1 tablet (5 mg total) by mouth daily.  Dispense: 90 tablet; Refill: 3  2. Intention tremor - atenolol (TENORMIN) 25 MG tablet; Take 1 tablet (25 mg total) by mouth daily.  Dispense: 90 tablet; Refill: 3  3. Chronic  anxiety - busPIRone (BUSPAR) 15 MG tablet; Take 1 tablet (15 mg total) by mouth 2 (two) times daily.  Dispense: 180 tablet; Refill: 3  4. Ductal carcinoma in situ (DCIS) of left breast  5.  Due for flu shot.  We are currently out of the vaccine.  I have encouraged her to have it done at any local pharmacy.  Patient states that she can get  it free at her church and will do that.    Patient was given the opportunity to ask questions.  Patient verbalized understanding of the plan and was able to repeat key elements of the plan.   No orders of the defined types were placed in this encounter.    Requested Prescriptions   Pending Prescriptions Disp Refills  . atenolol (TENORMIN) 25 MG tablet 90 tablet 3    Sig: Take 1 tablet (25 mg total) by mouth daily.  . busPIRone (BUSPAR) 15 MG tablet 180 tablet 3    Sig: Take 1 tablet (15 mg total) by mouth 2 (two) times daily.  . traZODone (DESYREL) 50 MG tablet 30 tablet 3    Sig: Take 1 tablet (50 mg total) by mouth at bedtime as needed for sleep.  Marland Kitchen FLUoxetine (PROZAC) 20 MG tablet 90 tablet 3    Sig: Take 1 tablet (20 mg total) by mouth daily.    No follow-ups on file.  Karle Plumber, MD, FACP

## 2018-11-11 NOTE — Patient Instructions (Addendum)
Your blood pressure is not at goal of 130/80 or lower.  Please continue taking the atenolol.  We have added a new blood pressure medication called amlodipine which she will also take once a day.  Continue to limit salt in the foods.  Do not forget to get your flu vaccine at an outside pharmacy.

## 2018-11-12 ENCOUNTER — Ambulatory Visit
Admission: RE | Admit: 2018-11-12 | Discharge: 2018-11-12 | Disposition: A | Discharge status: Home / Self Care | Payer: Medicaid Other | Source: Ambulatory Visit | Attending: Radiation Oncology | Admitting: Radiation Oncology

## 2018-11-12 DIAGNOSIS — Z510 Encounter for antineoplastic radiation therapy: Secondary | ICD-10-CM | POA: Diagnosis not present

## 2018-11-12 DIAGNOSIS — D0512 Intraductal carcinoma in situ of left breast: Secondary | ICD-10-CM | POA: Diagnosis not present

## 2018-11-15 NOTE — Progress Notes (Signed)
  Radiation Oncology         [phone] ________________________________  Name: Kimberly [name] MRN: [id number]  Date: 11/12/2018  DOB: 08-Dec-1955  Optical Surface Tracking Plan:  Since intensity modulated radiotherapy (IMRT) and 3D conformal radiation treatment methods are predicated on accurate and precise positioning for treatment, intrafraction motion monitoring is medically necessary to ensure accurate and safe treatment delivery.  The ability to quantify intrafraction motion without excessive ionizing radiation dose can only be performed with optical surface tracking. Accordingly, surface imaging offers the opportunity to obtain 3D measurements of patient position throughout IMRT and 3D treatments without excessive radiation exposure.  I am ordering optical surface tracking for this patient's upcoming course of radiotherapy. ________________________________  Kyung Rudd, MD 11/15/2018 8:11 AM    Reference:   Particia Jasper, et al. Surface imaging-based analysis of intrafraction motion for breast radiotherapy patients.Journal of [place], n. 6, nov. 2014. ISSN [id number].   Available at: <http://www.jacmp.org/index.php/jacmp/article/view/4957>.

## 2018-11-15 NOTE — Progress Notes (Signed)
  Radiation Oncology         [phone] ________________________________  Name: Kimberly [name] MRN: [id number]  Date: 11/12/2018  DOB: 1956-10-05   DIAGNOSIS:     ICD-10-CM   1. Ductal carcinoma in situ (DCIS) of left breast D05.12     SIMULATION AND TREATMENT PLANNING NOTE  The patient presented for simulation prior to beginning her course of radiation treatment for her diagnosis of left-sided breast cancer. The patient was placed in a supine position on a breast board. A customized vac-lock bag was constructed and this complex treatment device will be used on a daily basis during her treatment. In this fashion, a CT scan was obtained through the chest area and an isocenter was placed near the chest wall within the breast.  The patient will be planned to receive a course of radiation initially to a dose of 42.56 Gy. This will consist of a whole breast radiotherapy technique. To accomplish this, 2 customized blocks have been designed which will correspond to medial and lateral whole breast tangent fields. This treatment will be accomplished at 2.66 Gy per fraction. A forward planning technique will also be evaluated to determine if this approach improves the plan. It is anticipated that the patient will then receive a 8 Gy boost to the seroma cavity which has been contoured. This will be accomplished at 2 Gy per fraction.   This initial treatment will consist of a 3-D conformal technique. The seroma has been contoured as the primary target structure. Additionally, dose volume histograms of both this target as well as the lungs and heart will also be evaluated. Such an approach is necessary to ensure that the target area is adequately covered while the nearby critical  normal structures are adequately spared.  Plan:  The final anticipated total dose therefore will correspond to 50.56 Gy.    _______________________________   Jodelle Gross, MD, PhD

## 2018-11-16 ENCOUNTER — Telehealth: Payer: Self-pay | Admitting: Hematology and Oncology

## 2018-11-16 DIAGNOSIS — Z510 Encounter for antineoplastic radiation therapy: Secondary | ICD-10-CM | POA: Diagnosis not present

## 2018-11-16 DIAGNOSIS — D0512 Intraductal carcinoma in situ of left breast: Secondary | ICD-10-CM | POA: Insufficient documentation

## 2018-11-16 NOTE — Telephone Encounter (Signed)
Scheduled appt per 2/3 sch message - unable to reach patient - left message and sent reminder letter in the mail .

## 2018-11-19 ENCOUNTER — Telehealth: Payer: Self-pay | Admitting: General Practice

## 2018-11-19 NOTE — Telephone Encounter (Signed)
[facility] CSW Progress Notes  Patient called, concerned about transportation.  Has some help from friends, worried she will need rides to/from treatment at some point.  Geophysicist/field seismologist, asked that they connect w patient to enroll her in program and set up rides as needed.  Edwyna Shell, LCSW Clinical Social Worker Phone:  [phone]

## 2018-11-22 ENCOUNTER — Ambulatory Visit: Payer: Medicaid Other | Admitting: Critical Care Medicine

## 2018-11-22 ENCOUNTER — Ambulatory Visit
Admission: RE | Admit: 2018-11-22 | Discharge: 2018-11-22 | Disposition: A | Discharge status: Home / Self Care | Payer: Medicaid Other | Source: Ambulatory Visit | Attending: Radiation Oncology | Admitting: Radiation Oncology

## 2018-11-22 DIAGNOSIS — Z510 Encounter for antineoplastic radiation therapy: Secondary | ICD-10-CM | POA: Diagnosis not present

## 2018-11-23 ENCOUNTER — Telehealth: Payer: Self-pay | Admitting: Radiation Oncology

## 2018-11-23 ENCOUNTER — Ambulatory Visit
Admission: RE | Admit: 2018-11-23 | Discharge: 2018-11-23 | Disposition: A | Discharge status: Home / Self Care | Payer: Medicaid Other | Source: Ambulatory Visit | Attending: Radiation Oncology | Admitting: Radiation Oncology

## 2018-11-23 DIAGNOSIS — Z510 Encounter for antineoplastic radiation therapy: Secondary | ICD-10-CM | POA: Diagnosis not present

## 2018-11-23 NOTE — Telephone Encounter (Signed)
Spoke with patient and gave her my direct contact information for when she needs rides. Instructed her to call me whenever she will need a ride and I will set it up.

## 2018-11-24 ENCOUNTER — Ambulatory Visit
Admission: RE | Admit: 2018-11-24 | Discharge: 2018-11-24 | Disposition: A | Discharge status: Home / Self Care | Payer: Medicaid Other | Source: Ambulatory Visit | Attending: Radiation Oncology | Admitting: Radiation Oncology

## 2018-11-24 DIAGNOSIS — Z510 Encounter for antineoplastic radiation therapy: Secondary | ICD-10-CM | POA: Diagnosis not present

## 2018-11-25 ENCOUNTER — Ambulatory Visit
Admission: RE | Admit: 2018-11-25 | Discharge: 2018-11-25 | Disposition: A | Discharge status: Home / Self Care | Payer: Medicaid Other | Source: Ambulatory Visit | Attending: Radiation Oncology | Admitting: Radiation Oncology

## 2018-11-25 DIAGNOSIS — Z510 Encounter for antineoplastic radiation therapy: Secondary | ICD-10-CM | POA: Diagnosis not present

## 2018-11-26 ENCOUNTER — Ambulatory Visit
Admission: RE | Admit: 2018-11-26 | Discharge: 2018-11-26 | Disposition: A | Discharge status: Home / Self Care | Payer: Medicaid Other | Source: Ambulatory Visit | Attending: Radiation Oncology | Admitting: Radiation Oncology

## 2018-11-26 DIAGNOSIS — Z510 Encounter for antineoplastic radiation therapy: Secondary | ICD-10-CM | POA: Diagnosis not present

## 2018-11-26 DIAGNOSIS — D0512 Intraductal carcinoma in situ of left breast: Secondary | ICD-10-CM

## 2018-11-26 MED ORDER — ALRA NON-METALLIC DEODORANT (RAD-ONC)
1.0000 "application " | Freq: Once | TOPICAL | Status: AC
  Administered 2018-11-26: 1 via TOPICAL

## 2018-11-26 MED ORDER — RADIAPLEXRX EX GEL
Freq: Once | CUTANEOUS | Status: AC
  Administered 2018-11-26: 18:00:00 via TOPICAL

## 2018-11-26 NOTE — Progress Notes (Signed)
Pt here for patient teaching.  Pt given Radiation and You booklet, skin care instructions, Alra deodorant and Radiaplex gel.  Reviewed areas of pertinence such as fatigue, hair loss, skin changes, breast tenderness and breast swelling . Pt able to give teach back of to pat skin and use unscented/gentle soap,apply Radiaplex bid, avoid applying anything to skin within 4 hours of treatment, avoid wearing an under wire bra and to use an electric razor if they must shave. Pt verbalizes understanding of information given and will contact nursing with any questions or concerns.     Kyleigha Markert M. Callie Bunyard RN, BSN      

## 2018-11-29 ENCOUNTER — Ambulatory Visit
Admission: RE | Admit: 2018-11-29 | Discharge: 2018-11-29 | Disposition: A | Discharge status: Home / Self Care | Payer: Medicaid Other | Source: Ambulatory Visit | Attending: Radiation Oncology | Admitting: Radiation Oncology

## 2018-11-29 DIAGNOSIS — Z510 Encounter for antineoplastic radiation therapy: Secondary | ICD-10-CM | POA: Diagnosis not present

## 2018-11-30 ENCOUNTER — Ambulatory Visit
Admission: RE | Admit: 2018-11-30 | Discharge: 2018-11-30 | Disposition: A | Discharge status: Home / Self Care | Payer: Medicaid Other | Source: Ambulatory Visit | Attending: Radiation Oncology | Admitting: Radiation Oncology

## 2018-11-30 DIAGNOSIS — Z510 Encounter for antineoplastic radiation therapy: Secondary | ICD-10-CM | POA: Diagnosis not present

## 2018-11-30 MED FILL — ATENOLOL 25 MG TABLET: 25 | 30 days supply | Qty: 30 | Fill #0

## 2018-11-30 MED FILL — busPIRone HCL 15 MG TABS: 15 | 30 days supply | Qty: 60 | Fill #0

## 2018-12-01 ENCOUNTER — Ambulatory Visit
Admission: RE | Admit: 2018-12-01 | Discharge: 2018-12-01 | Disposition: A | Discharge status: Home / Self Care | Payer: Medicaid Other | Source: Ambulatory Visit | Attending: Radiation Oncology | Admitting: Radiation Oncology

## 2018-12-01 DIAGNOSIS — Z510 Encounter for antineoplastic radiation therapy: Secondary | ICD-10-CM | POA: Diagnosis not present

## 2018-12-02 ENCOUNTER — Ambulatory Visit
Admission: RE | Admit: 2018-12-02 | Discharge: 2018-12-02 | Disposition: A | Discharge status: Home / Self Care | Payer: Medicaid Other | Source: Ambulatory Visit | Attending: Radiation Oncology | Admitting: Radiation Oncology

## 2018-12-02 DIAGNOSIS — Z510 Encounter for antineoplastic radiation therapy: Secondary | ICD-10-CM | POA: Diagnosis not present

## 2018-12-03 ENCOUNTER — Ambulatory Visit
Admission: RE | Admit: 2018-12-03 | Discharge: 2018-12-03 | Disposition: A | Discharge status: Home / Self Care | Payer: Medicaid Other | Source: Ambulatory Visit | Attending: Radiation Oncology | Admitting: Radiation Oncology

## 2018-12-03 DIAGNOSIS — Z510 Encounter for antineoplastic radiation therapy: Secondary | ICD-10-CM | POA: Diagnosis not present

## 2018-12-06 ENCOUNTER — Ambulatory Visit
Admission: RE | Admit: 2018-12-06 | Discharge: 2018-12-06 | Disposition: A | Discharge status: Home / Self Care | Payer: Medicaid Other | Source: Ambulatory Visit | Attending: Radiation Oncology | Admitting: Radiation Oncology

## 2018-12-06 DIAGNOSIS — Z510 Encounter for antineoplastic radiation therapy: Secondary | ICD-10-CM | POA: Diagnosis not present

## 2018-12-07 ENCOUNTER — Ambulatory Visit
Admission: RE | Admit: 2018-12-07 | Discharge: 2018-12-07 | Disposition: A | Discharge status: Home / Self Care | Payer: Medicaid Other | Source: Ambulatory Visit | Attending: Radiation Oncology | Admitting: Radiation Oncology

## 2018-12-07 DIAGNOSIS — Z510 Encounter for antineoplastic radiation therapy: Secondary | ICD-10-CM | POA: Diagnosis not present

## 2018-12-08 ENCOUNTER — Ambulatory Visit
Admission: RE | Admit: 2018-12-08 | Discharge: 2018-12-08 | Disposition: A | Discharge status: Home / Self Care | Payer: Medicaid Other | Source: Ambulatory Visit | Attending: Radiation Oncology | Admitting: Radiation Oncology

## 2018-12-08 DIAGNOSIS — Z510 Encounter for antineoplastic radiation therapy: Secondary | ICD-10-CM | POA: Diagnosis not present

## 2018-12-09 ENCOUNTER — Ambulatory Visit
Admission: RE | Admit: 2018-12-09 | Discharge: 2018-12-09 | Disposition: A | Discharge status: Home / Self Care | Payer: Medicaid Other | Source: Ambulatory Visit | Attending: Radiation Oncology | Admitting: Radiation Oncology

## 2018-12-09 DIAGNOSIS — Z510 Encounter for antineoplastic radiation therapy: Secondary | ICD-10-CM | POA: Diagnosis not present

## 2018-12-09 MED FILL — OSELTAMIVIR PHOSPHATE 75 MG: 75 | 7 days supply | Qty: 7 | Fill #0

## 2018-12-09 NOTE — Progress Notes (Signed)
Patient Care Team: Ladell Pier, MD as PCP - General (Internal Medicine) Gala Romney Cristopher Estimable, MD as Consulting Physician (Gastroenterology) Erroll Luna, MD as Consulting Physician (General Surgery) Nicholas Lose, MD as Consulting Physician (Hematology and Oncology) Kyung Rudd, MD as Consulting Physician (Radiation Oncology)  DIAGNOSIS:    ICD-10-CM   1. Ductal carcinoma in situ (DCIS) of left breast D05.12     SUMMARY OF ONCOLOGIC HISTORY:   Ductal carcinoma in situ (DCIS) of left breast   09/07/2018 Initial Diagnosis    Bilateral masses and calcifications, right breast benign, left breast UOQ 3.2 cm biopsy revealed intermediate grade to high-grade DCIS ER 100%, PR 100%, 4 o'clock position retroareolar calcifications 2.2 cm biopsy revealed intermediate grade DCIS with [facility] ER 100%, PR 90%, both lesions are 5.5 cm apart, Tis NX stage 0    10/14/2018 Surgery    Left breast lumpectomy x2: 2 foci of DCIS intermediate grade 1.6 cm and 1.1 cm, LCIS, final margins negative.  The smaller DCIS 0.1 cm from inferior margin, ER 100%, PR 90%, Tis NX stage 0    10/27/2018 Cancer Staging    Staging form: Breast, AJCC 8th Edition - Pathologic: Stage 0 (pTis (DCIS), pN0, cM0, ER+, PR+) - Signed by Gardenia Phlegm, NP on 10/27/2018    11/23/2018 - 12/14/2018 Radiation Therapy    Adjuvant radiation     CHIEF COMPLIANT: Follow-up of radiation to begin anti-estrogen therapy  INTERVAL HISTORY: Kimberly [name] is a 63 y.o. with above-mentioned history of left breast DCIS underwent lumpectomy x2. She began radiation on 11/23/18. She presents to the clinic today to discuss adjuvant treatment plan.  She has 3 more days of radiation left.  She has mild radiation dermatitis.  REVIEW OF SYSTEMS:   Constitutional: Denies fevers, chills or abnormal weight loss Eyes: Denies blurriness of vision Ears, nose, mouth, throat, and face: Denies mucositis or sore throat Respiratory: Denies cough, dyspnea  or wheezes Cardiovascular: Denies palpitation, chest discomfort Gastrointestinal: Denies nausea, heartburn or change in bowel habits Skin: Denies abnormal skin rashes Lymphatics: Denies new lymphadenopathy or easy bruising Neurological: Denies numbness, tingling or new weaknesses Behavioral/Psych: Mood is stable, no new changes  Extremities: No lower extremity edema Breast: Mild radiation dermatitis.  Denies any pain or lumps or nodules in either breasts All other systems were reviewed with the patient and are negative.  I have reviewed the past medical history, past surgical history, social history and family history with the patient and they are unchanged from previous note.  ALLERGIES:  is allergic to codeine.  MEDICATIONS:  Current Outpatient Medications  Medication Sig Dispense Refill  . amLODipine (NORVASC) 5 MG tablet Take 1 tablet (5 mg total) by mouth daily. 90 tablet 3  . atenolol (TENORMIN) 25 MG tablet Take 1 tablet (25 mg total) by mouth daily. 90 tablet 3  . busPIRone (BUSPAR) 15 MG tablet Take 1 tablet (15 mg total) by mouth 2 (two) times daily. 180 tablet 3  . ibuprofen (ADVIL,MOTRIN) 200 MG tablet Take 200 mg by mouth every 6 (six) hours as needed.    . Multiple Vitamin (MULTIVITAMIN WITH MINERALS) TABS tablet Take 1 tablet by mouth daily.     . traZODone (DESYREL) 50 MG tablet Take 1 tablet (50 mg total) by mouth at bedtime as needed for sleep. MUST MAKE APPT FOR FURTHER REFILLS (Patient not taking: Reported on 09/27/2018) 30 tablet 0   No current facility-administered medications for this visit.     PHYSICAL EXAMINATION: ECOG  PERFORMANCE STATUS: 1 - Symptomatic but completely ambulatory  There were no vitals filed for this visit. There were no vitals filed for this visit.  GENERAL: alert, no distress and comfortable SKIN: skin color, texture, turgor are normal, no rashes or significant lesions EYES: normal, Conjunctiva are pink and non-injected, sclera  clear OROPHARYNX: no exudate, no erythema and lips, buccal mucosa, and tongue normal  NECK: supple, thyroid normal size, non-tender, without nodularity LYMPH: no palpable lymphadenopathy in the cervical, axillary or inguinal LUNGS: clear to auscultation and percussion with normal breathing effort HEART: regular rate & rhythm and no murmurs and no lower extremity edema ABDOMEN: abdomen soft, non-tender and normal bowel sounds MUSCULOSKELETAL: no cyanosis of digits and no clubbing  NEURO: alert & oriented x 3 with fluent speech, no focal motor/sensory deficits EXTREMITIES: No lower extremity edema   LABORATORY DATA:  I have reviewed the data as listed CMP Latest Ref Rng & Units 10/08/2018 09/15/2018 08/03/2018  Glucose 70 - 99 mg/dL 104(H) 104(H) 164(H)  BUN 8 - 23 mg/dL 15 15 10   Creatinine 0.44 - 1.00 mg/dL 0.95 0.92 0.87  Sodium 135 - 145 mmol/L 139 141 139  Potassium 3.5 - 5.1 mmol/L 4.6 4.8 3.6  Chloride 98 - 111 mmol/L 107 109 107  CO2 22 - 32 mmol/L 25 23 24   Calcium 8.9 - 10.3 mg/dL 9.9 9.8 9.3  Total Protein 6.5 - 8.1 g/dL 7.8 7.7 -  Total Bilirubin 0.3 - 1.2 mg/dL 0.4 0.4 -  Alkaline Phos 38 - 126 U/L 74 87 -  AST 15 - 41 U/L 19 16 -  ALT 0 - 44 U/L 15 11 -    Lab Results  Component Value Date   WBC 6.1 10/08/2018   HGB 14.7 10/08/2018   HCT 45.0 10/08/2018   MCV 93.0 10/08/2018   PLT 473 (H) 10/08/2018   NEUTROABS 3.3 10/08/2018    ASSESSMENT & PLAN:  Ductal carcinoma in situ (DCIS) of left breast 10/14/2018: Left breast lumpectomy x2: 2 foci of DCIS intermediate grade 1.6 cm and 1.1 cm, LCIS, final margins negative.  The smaller DCIS 0.1 cm from inferior margin, ER 100%, PR 90%, Tis NX stage 0  Treatment plan: 1.  Adjuvant radiation therapy 11/23/2018-12/14/2018 2.  Followed by adjuvant antiestrogen therapy with tamoxifen x5 years  Tamoxifen counseling:We discussed the risks and benefits of tamoxifen. These include but not limited to insomnia, hot flashes, mood  changes, vaginal dryness, and weight gain. Although rare, serious side effects including endometrial cancer, risk of blood clots were also discussed. We strongly believe that the benefits far outweigh the risks. Patient understands these risks and consented to starting treatment. Planned treatment duration is 5 years.  Return to clinic in 3 months for survivorship care plan visit   No orders of the defined types were placed in this encounter.  The patient has a good understanding of the overall plan. she agrees with it. she will call with any problems that may develop before the next visit here.  Nicholas Lose, MD 12/14/2018  Julious Oka Dorshimer am acting as scribe for Dr. Nicholas Lose.  I have reviewed the above documentation for accuracy and completeness, and I agree with the above.

## 2018-12-10 ENCOUNTER — Ambulatory Visit
Admission: RE | Admit: 2018-12-10 | Discharge: 2018-12-10 | Disposition: A | Discharge status: Home / Self Care | Payer: Medicaid Other | Source: Ambulatory Visit | Attending: Radiation Oncology | Admitting: Radiation Oncology

## 2018-12-10 ENCOUNTER — Ambulatory Visit: Payer: Medicaid Other | Admitting: Radiation Oncology

## 2018-12-10 DIAGNOSIS — Z510 Encounter for antineoplastic radiation therapy: Secondary | ICD-10-CM | POA: Diagnosis not present

## 2018-12-13 ENCOUNTER — Ambulatory Visit
Admission: RE | Admit: 2018-12-13 | Discharge: 2018-12-13 | Disposition: A | Discharge status: Home / Self Care | Payer: Medicaid Other | Source: Ambulatory Visit | Attending: Radiation Oncology | Admitting: Radiation Oncology

## 2018-12-13 DIAGNOSIS — D0512 Intraductal carcinoma in situ of left breast: Secondary | ICD-10-CM | POA: Diagnosis not present

## 2018-12-13 DIAGNOSIS — Z510 Encounter for antineoplastic radiation therapy: Secondary | ICD-10-CM | POA: Diagnosis present

## 2018-12-14 ENCOUNTER — Ambulatory Visit
Admission: RE | Admit: 2018-12-14 | Discharge: 2018-12-14 | Disposition: A | Discharge status: Home / Self Care | Payer: Medicaid Other | Source: Ambulatory Visit | Attending: Radiation Oncology | Admitting: Radiation Oncology

## 2018-12-14 ENCOUNTER — Telehealth: Payer: Self-pay | Admitting: Adult Health

## 2018-12-14 ENCOUNTER — Inpatient Hospital Stay: Payer: Medicaid Other | Attending: Hematology and Oncology | Admitting: Hematology and Oncology

## 2018-12-14 ENCOUNTER — Telehealth: Payer: Self-pay | Admitting: Hematology and Oncology

## 2018-12-14 DIAGNOSIS — D0512 Intraductal carcinoma in situ of left breast: Secondary | ICD-10-CM

## 2018-12-14 DIAGNOSIS — Z79899 Other long term (current) drug therapy: Secondary | ICD-10-CM | POA: Diagnosis not present

## 2018-12-14 DIAGNOSIS — Z923 Personal history of irradiation: Secondary | ICD-10-CM | POA: Diagnosis not present

## 2018-12-14 DIAGNOSIS — Z170 Estrogen receptor positive status [ER+]: Secondary | ICD-10-CM | POA: Insufficient documentation

## 2018-12-14 DIAGNOSIS — Z791 Long term (current) use of non-steroidal anti-inflammatories (NSAID): Secondary | ICD-10-CM | POA: Diagnosis not present

## 2018-12-14 DIAGNOSIS — Z510 Encounter for antineoplastic radiation therapy: Secondary | ICD-10-CM | POA: Diagnosis not present

## 2018-12-14 MED ORDER — TAMOXIFEN CITRATE 20 MG PO TABS: 20.0000 mg | ORAL_TABLET | Freq: Every day | ORAL | 3 refills | Status: DC

## 2018-12-14 MED FILL — TAMOXIFEN CITRATE 20 MG TAB: 20 | 90 days supply | Qty: 90 | Fill #0 | Status: TO

## 2018-12-14 NOTE — Telephone Encounter (Signed)
Erroneous encounter

## 2018-12-14 NOTE — Assessment & Plan Note (Signed)
10/14/2018: Left breast lumpectomy x2: 2 foci of DCIS intermediate grade 1.6 cm and 1.1 cm, LCIS, final margins negative.  The smaller DCIS 0.1 cm from inferior margin, ER 100%, PR 90%, Tis NX stage 0  Treatment plan: 1.  Adjuvant radiation therapy 11/23/2018-12/14/2018 2.  Followed by adjuvant antiestrogen therapy with tamoxifen x5 years  Tamoxifen counseling:We discussed the risks and benefits of tamoxifen. These include but not limited to insomnia, hot flashes, mood changes, vaginal dryness, and weight gain. Although rare, serious side effects including endometrial cancer, risk of blood clots were also discussed. We strongly believe that the benefits far outweigh the risks. Patient understands these risks and consented to starting treatment. Planned treatment duration is 5 years.  Return to clinic in 3 months for survivorship care plan visit

## 2018-12-14 NOTE — Telephone Encounter (Signed)
Gave avs and calendar ° °

## 2018-12-15 ENCOUNTER — Ambulatory Visit
Admission: RE | Admit: 2018-12-15 | Discharge: 2018-12-15 | Disposition: A | Discharge status: Home / Self Care | Payer: Medicaid Other | Source: Ambulatory Visit | Attending: Radiation Oncology | Admitting: Radiation Oncology

## 2018-12-15 DIAGNOSIS — Z510 Encounter for antineoplastic radiation therapy: Secondary | ICD-10-CM | POA: Diagnosis not present

## 2018-12-16 ENCOUNTER — Ambulatory Visit
Admission: RE | Admit: 2018-12-16 | Discharge: 2018-12-16 | Disposition: A | Discharge status: Home / Self Care | Payer: Medicaid Other | Source: Ambulatory Visit | Attending: Radiation Oncology | Admitting: Radiation Oncology

## 2018-12-16 DIAGNOSIS — Z510 Encounter for antineoplastic radiation therapy: Secondary | ICD-10-CM | POA: Diagnosis not present

## 2018-12-17 ENCOUNTER — Encounter: Payer: Self-pay | Admitting: Radiation Oncology

## 2018-12-17 ENCOUNTER — Ambulatory Visit
Admission: RE | Admit: 2018-12-17 | Discharge: 2018-12-17 | Disposition: A | Discharge status: Home / Self Care | Payer: Medicaid Other | Source: Ambulatory Visit | Attending: Radiation Oncology | Admitting: Radiation Oncology

## 2018-12-17 DIAGNOSIS — Z510 Encounter for antineoplastic radiation therapy: Secondary | ICD-10-CM | POA: Diagnosis not present

## 2018-12-21 ENCOUNTER — Encounter: Payer: Self-pay | Admitting: *Deleted

## 2019-01-03 ENCOUNTER — Telehealth: Payer: Self-pay | Admitting: *Deleted

## 2019-01-03 NOTE — Telephone Encounter (Signed)
Called about not being able to get her bld pressure medcicine through [facility].  She called last week and was told it would be ready today.  RN at the front door could not find where the refill had been done and told her that the medication would be mailed to her.  I called the Center back and was told that she would have to go to her pharmacy and get some of her medicine through the pharmacy until the mail order arrived.  She was not allowed to go into the center.  I called her back and instructed her to go the filling pharmacy and ask for a few of her bp pills so that she could wait until they arrived by mail.  Also instructed client not to wait until the medication was empty to ask for refills but to ask when she was down to one weeks sudpply.  She understands this and will go to pharmacy.

## 2019-01-11 ENCOUNTER — Telehealth: Payer: Self-pay | Admitting: Internal Medicine

## 2019-01-11 MED FILL — ATENOLOL 25 MG TABLET: 25 | 30 days supply | Qty: 30 | Fill #1 | Status: TO

## 2019-01-11 MED FILL — AMLODIPINE BESYLATE 5 MG TA: 5 | 30 days supply | Qty: 30 | Fill #1 | Status: TO

## 2019-01-11 MED FILL — busPIRone HCL 15 MG TABS: 15 | 30 days supply | Qty: 60 | Fill #1 | Status: TO

## 2019-01-11 NOTE — Telephone Encounter (Signed)
Pt called in stated she ordered her medication states its been since 3/19 and she still hasnt received her meds  -busPIRone (BUSPAR)  -amLODipine (NORVASC) 5 MG tablet since then pt verified her address is current she verified please follow up   [place] [address]

## 2019-01-11 NOTE — Telephone Encounter (Signed)
Pharmacy notified.

## 2019-01-19 NOTE — Progress Notes (Signed)
  Radiation Oncology         [phone] ________________________________  Name: Kimberly [name] MRN: [id number]  Date: 12/17/2018  DOB: 1956/10/04  End of Treatment Note  Diagnosis:   left-sided breast cancer     Indication for treatment:  Curative       Radiation treatment dates:   11/22/18 - 12/17/18  Site/dose:   The patient initially received a dose of 42.56 Gy in 16 fractions to the breast using whole-breast tangent fields. This was delivered using a 3-D conformal technique. The patient then received a boost to the seroma. This delivered an additional 8 Gy in 3 fractions using a 3 field photon technique due to the depth of the seroma. The total dose was 52.56 Gy.  Narrative: The patient tolerated radiation treatment relatively well.   The patient had some expected skin irritation as she progressed during treatment. Moist desquamation was not present at the end of treatment.  Plan: The patient has completed radiation treatment. The patient will return to radiation oncology clinic for routine followup in one month. I advised the patient to call or return sooner if they have any questions or concerns related to their recovery or treatment. ________________________________  Jodelle Gross, M.D., Ph.D.

## 2019-02-02 ENCOUNTER — Telehealth: Payer: Self-pay | Admitting: Radiation Oncology

## 2019-02-02 NOTE — Telephone Encounter (Signed)
  Radiation Oncology         [phone] ________________________________  Name: Kimberly [name] MRN: [id number]  Date of Service: 02/02/2019  DOB: Jun 07, 1956  Post Treatment Telephone Note  Diagnosis:  Multifocal ER/PR positive intermediate grade DCIS of the left breast.  Interval Since Last Radiation:  7 weeks   11/22/18 - 12/17/18: The patient initially received a dose of 42.56 Gy in 16 fractions to the breast using whole-breast tangent fields. This was delivered using a 3-D conformal technique. The patient then received a boost to the seroma. This delivered an additional 8 Gy in 3 fractions using a 3 field photon technique due to the depth of the seroma. The total dose was 52.56 Gy.  Narrative:  The patient was contacted today for routine follow-up. During treatment she did very well with radiotherapy and did not have significant desquamation. She reports she has some dryness of the skin and some .  Impression/Plan: 1. Multifocal ER/PR positive intermediate grade DCIS of the left breast. The patient has been doing well since completion of radiotherapy. We discussed that we would be happy to continue to follow her as needed, but she will also continue to follow up with Dr. Lindi Adie in medical oncology. She was counseled on skin care as well as measures to avoid sun exposure to this area.  2. Survivorship. We discussed the importance of survivorship evaluation and she was offered the phone number for Ottis Stain [phone]) to be added to the list to receive the monthly resource calendar for the cancer center but declined.    Carola Rhine, PAC

## 2019-02-07 ENCOUNTER — Ambulatory Visit: Payer: Medicaid Other | Admitting: Radiation Oncology

## 2019-02-11 ENCOUNTER — Ambulatory Visit: Payer: Medicaid Other | Attending: Internal Medicine | Admitting: Internal Medicine

## 2019-02-11 ENCOUNTER — Ambulatory Visit: Payer: Medicaid Other | Admitting: Internal Medicine

## 2019-02-11 ENCOUNTER — Encounter: Payer: Self-pay | Admitting: Internal Medicine

## 2019-02-11 ENCOUNTER — Other Ambulatory Visit: Payer: Self-pay

## 2019-02-11 DIAGNOSIS — F419 Anxiety disorder, unspecified: Secondary | ICD-10-CM

## 2019-02-11 DIAGNOSIS — I10 Essential (primary) hypertension: Secondary | ICD-10-CM

## 2019-02-11 DIAGNOSIS — G252 Other specified forms of tremor: Secondary | ICD-10-CM | POA: Diagnosis not present

## 2019-02-11 DIAGNOSIS — F5105 Insomnia due to other mental disorder: Secondary | ICD-10-CM

## 2019-02-11 DIAGNOSIS — D0512 Intraductal carcinoma in situ of left breast: Secondary | ICD-10-CM

## 2019-02-11 DIAGNOSIS — F99 Mental disorder, not otherwise specified: Secondary | ICD-10-CM

## 2019-02-11 MED ORDER — TRAZODONE HCL 50 MG PO TABS: 25.0000 mg | ORAL_TABLET | Freq: Every day | ORAL | 3 refills | Status: DC

## 2019-02-11 MED ORDER — BLOOD PRESSURE MONITOR DEVI: 0 refills | Status: AC

## 2019-02-11 NOTE — Progress Notes (Signed)
Pt. Is following up for hypertension.  Patient is requesting for PCP to prescribe Rx for sleeping.

## 2019-02-11 NOTE — Progress Notes (Signed)
Virtual Visit via Telephone Note  I connected with Kimberly [name] on 02/11/19 at 8:47 a.m EDT by telephone from my office and verified that I am speaking with the correct person using two identifiers.  The patient is at home.  Only the patient and myself participated in this encounter.   I discussed the limitations, risks, security and privacy concerns of performing an evaluation and management service by telephone and the availability of in person appointments. I also discussed with the patient that there may be a patient responsible charge related to this service. The patient expressed understanding and agreed to proceed.   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  Patient last seen 10/2018   DCIS LT breast: completed XRT and now on Tamoxifen which she will be on for 5 yrs.  Tolerating Tamoxifen okay.  Anxiety:  Reports she is doing well on Buspar.  Insomnia: Still has problems sleeping. Has problems falling and staying asleep.  Recently started listening to 70s music on the radio when she gets in bed.  She has not found it that much helpful with sleep initiation.  "I'm a worry wart."  Admits that sometimes she is delayed in falling asleep because she is worrying about different things. Gets in bed b/w 9-10 p.m. after falling asleep, she gets up to use bathroom b/w 12-1 a.m and around 3 a.m. most of the times she has problems falling back asleep and finds herself twisting and turning a lot in bed. Denies drinking caffeinated beverages in the evenings or at night. She walks with her dog every morning for about 30 minutes  Essential tremor: Feels that the tremor in the right hand is worse.  She has to use her left hand to steady the right hand when she eats to prevent the food from spilling off of the spoon..  She is on atenolol.  She had seen neurology Dr. Macario Carls last year.  Had MRI of the brain that did  not reveal any significant findings for causation.  HYPERTENSION Currently taking: see medication list Med Adherence: [x]  Yes    []  No Medication side effects: []  Yes    [x]  No Adherence with salt restriction: [x]  Yes    []  No Home Monitoring?: []  Yes    [x]  No, no device to check. Would like to get a device. Monitoring Frequency: []  Yes    []  No Home BP results range: []  Yes    []  No SOB? []  Yes    [x]  No Chest Pain?: []  Yes    [x]  No Leg swelling?: []  Yes    [x]  No Headaches?: []  Yes    [x]  No Dizziness? []  Yes    [x]  No Comments:     Observations/Objective: No direct observation done as this was a telephone encounter I reviewed vital signs from her visit last month with the specialist.  Blood pressure at that time was good.  Depression screen [facility] 2/9 02/11/2019 11/11/2018 03/02/2018  Decreased Interest 0 0 0  Down, Depressed, Hopeless 1 1 1   PHQ - 2 Score 1 1 1   Altered sleeping 1 - -  Tired, decreased energy 0 - -  Change in appetite 0 - -  Feeling bad or failure about yourself  0 - -  Trouble concentrating 0 - -  Moving slowly or fidgety/restless 0 - -  Suicidal thoughts 0 - -  PHQ-9 Score 2 - -  Some recent data might be hidden   GAD 7 : Generalized Anxiety Score 02/11/2019 11/11/2018 03/02/2018 12/03/2017  Nervous, Anxious, on Edge 1 3 1 2   Control/stop worrying 1 1 1 1   Worry too much - different things 1 1 1 1   Trouble relaxing 0 1 0 0  Restless 0 1 0 0  Easily annoyed or irritable 1 1 2 2   Afraid - awful might happen 0 0 0 1  Total GAD 7 Score 4 8 5 7      Assessment and Plan: 1. Essential hypertension Patient to continue amlodipine and low-salt diet.  Prescription sent to the pharmacy for blood pressure monitoring device for her to check blood pressure at home once a week.  I have given her the goal of 130/80 or lower. - Blood Pressure Monitor DEVI; Use as directed to check home blood pressure 2-3 times a week  Dispense: 1 Device; Refill: 0  2. Ductal carcinoma  in situ (DCIS) of left breast Treated with lumpectomy, XRT and now on tamoxifen.  Followed by oncology.  3. Chronic anxiety Continue BuSpar. Encourage meditation with deep breathing exercises  4. Insomnia due to other mental disorder Anxiety may be playing a role with me in sleep initiation.  Recommend that she try meditation and deep breathing exercises once she gets in bed.  Good sleep hygiene discussed and encouraged.  Advised to return of the radio and TV when she gets in bed. - traZODone (DESYREL) 50 MG tablet; Take 0.5 tablets (25 mg total) by mouth at bedtime.  Dispense: 30 tablet; Refill: 3  5. Intention tremor Referral given for follow-up with neurology since she feels that the tremor in the hand is getting worse. - Ambulatory referral to Neurology   Follow Up Instructions: Follow-up in 3 months   I discussed the assessment and treatment plan with the patient. The patient was provided an opportunity to ask questions and all were answered. The patient agreed with the plan and demonstrated an understanding of the instructions.   The patient was advised to call back or seek an in-person evaluation if the symptoms worsen or if the condition fails to improve as anticipated.  I provided 22 minutes of non-face-to-face time during this encounter.   Karle Plumber, MD

## 2019-03-17 ENCOUNTER — Ambulatory Visit: Payer: Medicaid Other | Attending: Internal Medicine | Admitting: Internal Medicine

## 2019-03-17 ENCOUNTER — Encounter: Payer: Self-pay | Admitting: Internal Medicine

## 2019-03-17 ENCOUNTER — Other Ambulatory Visit: Payer: Self-pay

## 2019-03-17 VITALS — BP 142/84 | HR 77 | Temp 98.4°F | Resp 16 | Wt 168.8 lb

## 2019-03-17 DIAGNOSIS — Z79899 Other long term (current) drug therapy: Secondary | ICD-10-CM | POA: Insufficient documentation

## 2019-03-17 DIAGNOSIS — Z7901 Long term (current) use of anticoagulants: Secondary | ICD-10-CM | POA: Diagnosis not present

## 2019-03-17 DIAGNOSIS — Z8679 Personal history of other diseases of the circulatory system: Secondary | ICD-10-CM | POA: Insufficient documentation

## 2019-03-17 DIAGNOSIS — I10 Essential (primary) hypertension: Secondary | ICD-10-CM | POA: Diagnosis present

## 2019-03-17 DIAGNOSIS — K029 Dental caries, unspecified: Secondary | ICD-10-CM

## 2019-03-17 MED ORDER — AMLODIPINE BESYLATE 10 MG PO TABS: 10.0000 mg | ORAL_TABLET | Freq: Every day | ORAL | 6 refills | Status: DC

## 2019-03-17 NOTE — Patient Instructions (Signed)
Increase Amlodipine to 10 mg daily.  Continue to monitor blood pressure.  The gold is 130/80 or lower.

## 2019-03-17 NOTE — Progress Notes (Signed)
Patient ID: Kimberly [name], female    DOB: Feb 12, 1956  MRN: [id number]  CC: Medical Clearance   Subjective: Kimberly [name] is a 63 y.o. female who presents for for medical clearance to have tooth pulled.   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   Pt has seen dentist Dr. Graciella Freer.  He plans to pull a decayed molar in the left upper jaw.  Patient told him that she had history of rheumatic fever when she was in the sixth grade and she thought that she may need antibiotics prior to the procedure because of this.  She does not have any history of valve surgery.no history of endocarditis.   The procedure would be done in his office.  Patient is not sure whether she would be put to sleep, she thinks he will just numb the gum as the tooth is already very loose. -He has had surgeries in the past under general anesthesia.  She has had no problems waking up from anesthesia.  HTN:  Did get BP monitoring device and has it with her today.  Check BP 1 x/wk.  Readings 133/86, 126/87, 134/82, 135/86, 126/81, 133/86 Ports compliance with medications and salt restriction. She walks up and down a hill close to her house almost daily.  No chest pains or shortness of breath.  No lower extremity edema.  Patient Active Problem List   Diagnosis Date Noted  . Genetic testing 10/25/2018  . Family history of breast cancer   . Family history of lung cancer   . Ductal carcinoma in situ (DCIS) of left breast 09/07/2018  . Intention tremor 10/15/2017  . Diabetes mellitus screening 06/10/2017  . Breast nodule 03/02/2016  . Seasonal allergies 01/07/2016  . Right shoulder pain 09/28/2015  . Insomnia 07/06/2015  . Tinea pedis of both feet 07/06/2015  . Nocturia 08/25/2014  . Chronic anxiety 08/25/2014  . Heme positive stool 08/16/2014  . HTN (hypertension) 08/16/2014     Current Outpatient Medications on File Prior  to Visit  Medication Sig Dispense Refill  . amLODipine (NORVASC) 5 MG tablet Take 1 tablet (5 mg total) by mouth daily. 90 tablet 3  . atenolol (TENORMIN) 25 MG tablet Take 1 tablet (25 mg total) by mouth daily. 90 tablet 3  . Blood Pressure Monitor DEVI Use as directed to check home blood pressure 2-3 times a week 1 Device 0  . busPIRone (BUSPAR) 15 MG tablet Take 1 tablet (15 mg total) by mouth 2 (two) times daily. 180 tablet 3  . ibuprofen (ADVIL,MOTRIN) 200 MG tablet Take 200 mg by mouth every 6 (six) hours as needed.    . Multiple Vitamin (MULTIVITAMIN WITH MINERALS) TABS tablet Take 1 tablet by mouth daily.     . tamoxifen (NOLVADEX) 20 MG tablet Take 1 tablet (20 mg total) by mouth daily. 90 tablet 3  . traZODone (DESYREL) 50 MG tablet Take 0.5 tablets (25 mg total) by mouth at bedtime. 30 tablet 3   No current facility-administered medications on file prior to visit.     Allergies  Allergen Reactions  . Codeine Other (See Comments)    Cold sweats    Social History   Socioeconomic History  . Marital status: Single    Spouse name: Not on file  . Number of children: Not on file  . Years of education: Not on file  . Highest education level: Not  on file  Occupational History  . Not on file  Social Needs  . Financial resource strain: Not on file  . Food insecurity:    Worry: Not on file    Inability: Not on file  . Transportation needs:    Medical: Not on file    Non-medical: Not on file  Tobacco Use  . Smoking status: Never Smoker  . Smokeless tobacco: Never Used  Substance and Sexual Activity  . Alcohol use: No    Alcohol/week: 0.0 standard drinks  . Drug use: No  . Sexual activity: Yes    Birth control/protection: Post-menopausal  Lifestyle  . Physical activity:    Days per week: Not on file    Minutes per session: Not on file  . Stress: Not on file  Relationships  . Social connections:    Talks on phone: Not on file    Gets together: Not on file     Attends religious service: Not on file    Active member of club or organization: Not on file    Attends meetings of clubs or organizations: Not on file    Relationship status: Not on file  . Intimate partner violence:    Fear of current or ex partner: Not on file    Emotionally abused: Not on file    Physically abused: Not on file    Forced sexual activity: Not on file  Other Topics Concern  . Not on file  Social History Narrative  . Not on file    Family History  Problem Relation Age of Onset  . Breast cancer Mother 78  . Cancer Maternal Grandmother        thinks it was braest, but not sure what type of cancer, dx >50  . Heart disease Paternal Grandfather   . Lung cancer Maternal Aunt   . Colon cancer Neg Hx     Past Surgical History:  Procedure Laterality Date  . APPENDECTOMY    . bowel obstruction    . BREAST LUMPECTOMY WITH RADIOACTIVE SEED LOCALIZATION Left 10/14/2018   Procedure: BREAST LUMPECTOMY WITH RADIOACTIVE SEED LOCALIZATION X2;  Surgeon: Erroll Luna, MD;  Location: [place];  Service: General;  Laterality: Left;  . CHOLECYSTECTOMY    . COLONOSCOPY WITH PROPOFOL N/A 08/05/2018   Procedure: COLONOSCOPY WITH PROPOFOL;  Surgeon: Daneil Dolin, MD;  Location: [facility];  Service: Endoscopy;  Laterality: N/A;  12:45pm  . TONSILLECTOMY      ROS: Review of Systems Negative except as stated above  PHYSICAL EXAM: BP (!) 142/84   Pulse 77   Temp 98.4 F (36.9 C) (Oral)   Resp 16   Wt 168 lb 12.8 oz (76.6 kg)   SpO2 97%   BMI 28.09 kg/m   Physical Exam General appearance -alert well-appearing older African-American female in NAD.  She has slight tremor of the head.  This is not new. Mental status - normal mood, behavior, speech, dress, motor activity, and thought processes Mouth - mucous membranes moist, pharynx normal without lesions decayed third molar noted left upper jaw Neck - supple, no significant adenopathy Chest - clear to auscultation, no  wheezes, rales or rhonchi, symmetric air entry Heart - 1/6 SEM, LSB.  Regular rate and rhythm.  No gallops Extremities - peripheral pulses normal, no pedal edema, no clubbing or cyanosis  CMP Latest Ref Rng & Units 10/08/2018 09/15/2018 08/03/2018  Glucose 70 - 99 mg/dL 104(H) 104(H) 164(H)  BUN 8 - 23 mg/dL 15 15  10  Creatinine 0.44 - 1.00 mg/dL 0.95 0.92 0.87  Sodium 135 - 145 mmol/L 139 141 139  Potassium 3.5 - 5.1 mmol/L 4.6 4.8 3.6  Chloride 98 - 111 mmol/L 107 109 107  CO2 22 - 32 mmol/L 25 23 24   Calcium 8.9 - 10.3 mg/dL 9.9 9.8 9.3  Total Protein 6.5 - 8.1 g/dL 7.8 7.7 -  Total Bilirubin 0.3 - 1.2 mg/dL 0.4 0.4 -  Alkaline Phos 38 - 126 U/L 74 87 -  AST 15 - 41 U/L 19 16 -  ALT 0 - 44 U/L 15 11 -   Lipid Panel     Component Value Date/Time   CHOL 192 03/02/2018 1212   TRIG 114 03/02/2018 1212   HDL 64 03/02/2018 1212   CHOLHDL 3.0 03/02/2018 1212   CHOLHDL 2.7 11/13/2016 1102   VLDL 25 11/13/2016 1102   LDLCALC 105 (H) 03/02/2018 1212    CBC    Component Value Date/Time   WBC 6.1 10/08/2018 0919   RBC 4.84 10/08/2018 0919   HGB 14.7 10/08/2018 0919   HGB 13.6 09/15/2018 0820   HGB 15.1 03/02/2018 1212   HCT 45.0 10/08/2018 0919   HCT 44.7 03/02/2018 1212   PLT 473 (H) 10/08/2018 0919   PLT 423 (H) 09/15/2018 0820   PLT 418 03/02/2018 1212   MCV 93.0 10/08/2018 0919   MCV 90 03/02/2018 1212   MCH 30.4 10/08/2018 0919   MCHC 32.7 10/08/2018 0919   RDW 12.7 10/08/2018 0919   RDW 14.1 03/02/2018 1212   LYMPHSABS 1.9 10/08/2018 0919   MONOABS 0.6 10/08/2018 0919   EOSABS 0.2 10/08/2018 0919   BASOSABS 0.1 10/08/2018 0919    ASSESSMENT AND PLAN: 1. Essential hypertension Not at goal.  Increase amlodipine to 10 mg daily. - amLODipine (NORVASC) 10 MG tablet; Take 1 tablet (10 mg total) by mouth daily.  Dispense: 30 tablet; Refill: 6  2. Dental cavity 3. History of rheumatic fever as a child -Based on current guidelines for endocarditis prophylaxis, she  does not need antibiotic prophylaxis.  Letter written to her dentist letting him know that patient is medically optimized to have extraction    Patient was given the opportunity to ask questions.  Patient verbalized understanding of the plan and was able to repeat key elements of the plan.   No orders of the defined types were placed in this encounter.    Requested Prescriptions    No prescriptions requested or ordered in this encounter    No follow-ups on file.  Karle Plumber, MD, FACP

## 2019-03-24 ENCOUNTER — Telehealth: Payer: Self-pay | Admitting: Adult Health

## 2019-03-24 NOTE — Telephone Encounter (Signed)
I talk with patient regarding phone visit  °

## 2019-04-07 ENCOUNTER — Encounter: Payer: Medicaid Other | Admitting: Adult Health

## 2019-04-08 ENCOUNTER — Telehealth: Payer: Self-pay | Admitting: Adult Health

## 2019-04-08 NOTE — Telephone Encounter (Signed)
Contacted patient to verify telephone visit for pre reg °

## 2019-04-11 ENCOUNTER — Inpatient Hospital Stay: Payer: Medicaid Other | Attending: Hematology and Oncology | Admitting: Adult Health

## 2019-04-11 ENCOUNTER — Encounter: Payer: Self-pay | Admitting: Adult Health

## 2019-04-11 DIAGNOSIS — Z79899 Other long term (current) drug therapy: Secondary | ICD-10-CM

## 2019-04-11 DIAGNOSIS — Z170 Estrogen receptor positive status [ER+]: Secondary | ICD-10-CM | POA: Diagnosis not present

## 2019-04-11 DIAGNOSIS — E2839 Other primary ovarian failure: Secondary | ICD-10-CM | POA: Diagnosis not present

## 2019-04-11 DIAGNOSIS — Z79810 Long term (current) use of selective estrogen receptor modulators (SERMs): Secondary | ICD-10-CM

## 2019-04-11 DIAGNOSIS — Z803 Family history of malignant neoplasm of breast: Secondary | ICD-10-CM

## 2019-04-11 DIAGNOSIS — Z801 Family history of malignant neoplasm of trachea, bronchus and lung: Secondary | ICD-10-CM

## 2019-04-11 DIAGNOSIS — D0512 Intraductal carcinoma in situ of left breast: Secondary | ICD-10-CM | POA: Diagnosis not present

## 2019-04-11 DIAGNOSIS — I10 Essential (primary) hypertension: Secondary | ICD-10-CM

## 2019-04-11 DIAGNOSIS — Z923 Personal history of irradiation: Secondary | ICD-10-CM | POA: Diagnosis not present

## 2019-04-11 NOTE — Progress Notes (Signed)
SURVIVORSHIP VIRTUAL VISIT:  I connected with Kimberly [name] on 04/11/19 at  2:00 PM EDT by telphone and verified that I am speaking with the correct person using two identifiers.   I discussed the limitations, risks, security and privacy concerns of performing an evaluation and management service by telephone and the availability of in person appointments. I also discussed with the patient that there may be a patient responsible charge related to this service. The patient expressed understanding and agreed to proceed.   BRIEF ONCOLOGIC HISTORY:  Oncology History  Ductal carcinoma in situ (DCIS) of left breast  09/07/2018 Initial Diagnosis   Bilateral masses and calcifications, right breast benign, left breast UOQ 3.2 cm biopsy revealed intermediate grade to high-grade DCIS ER 100%, PR 100%, 4 o'clock position retroareolar calcifications 2.2 cm biopsy revealed intermediate grade DCIS with [facility] ER 100%, PR 90%, both lesions are 5.5 cm apart, Tis NX stage 0   10/14/2018 Surgery   Left breast lumpectomy x2: 2 foci of DCIS intermediate grade 1.6 cm and 1.1 cm, LCIS, final margins negative.  The smaller DCIS 0.1 cm from inferior margin, ER 100%, PR 90%, Tis NX stage 0   10/27/2018 Cancer Staging   Staging form: Breast, AJCC 8th Edition - Pathologic: Stage 0 (pTis (DCIS), pN0, cM0, ER+, PR+) - Signed by Gardenia Phlegm, NP on 10/27/2018   11/23/2018 - 12/14/2018 Radiation Therapy   Adjuvant radiation   01/2019 -  Anti-estrogen oral therapy   Tamoxifen daily     INTERVAL HISTORY:  [name] to review her survivorship care plan detailing her treatment course for breast cancer, as well as monitoring long-term side effects of that treatment, education regarding health maintenance, screening, and overall wellness and health promotion.     Overall, [name] reports feeling quite well.  She is taking Tamoxifen daily.  She says she is tolerating it well. She denies any arthralgias, hot  flashes, or vaginal discharge.   REVIEW OF SYSTEMS:  Review of Systems  Constitutional: Negative for appetite change, chills, fatigue, fever and unexpected weight change.  HENT:   Negative for hearing loss, lump/mass and sore throat.   Eyes: Negative for eye problems and icterus.  Respiratory: Negative for chest tightness, cough and shortness of breath.   Cardiovascular: Negative for chest pain, leg swelling and palpitations.  Gastrointestinal: Negative for abdominal distention, abdominal pain, constipation, diarrhea, nausea and vomiting.  Endocrine: Negative for hot flashes.  Genitourinary: Negative for difficulty urinating.   Musculoskeletal: Negative for arthralgias.  Skin: Negative for itching and rash.  Neurological: Negative for dizziness, extremity weakness, headaches and numbness.  Hematological: Negative for adenopathy. Does not bruise/bleed easily.  Psychiatric/Behavioral: Negative for depression. The patient is not nervous/anxious.   Breast: Denies any new nodularity, masses, tenderness, nipple changes, or nipple discharge.      ONCOLOGY TREATMENT TEAM:  1. Surgeon:  Dr. Brantley Stage at [facility] 2. Medical Oncologist: Dr. Lindi Adie  3. Radiation Oncologist: Dr. Lisbeth Renshaw    PAST MEDICAL/SURGICAL HISTORY:  Past Medical History:  Diagnosis Date  . Anxiety 1995  . Cancer [facility])    breast left  . Depression 1995  . Family history of breast cancer   . Family history of lung cancer   . History of kidney stones   . History of rheumatic fever as a child   . Hypertension Dx Dec 2015  . Panic attack 1995  . Tremor    Past Surgical History:  Procedure Laterality Date  . APPENDECTOMY    .  bowel obstruction    . BREAST LUMPECTOMY WITH RADIOACTIVE SEED LOCALIZATION Left 10/14/2018   Procedure: BREAST LUMPECTOMY WITH RADIOACTIVE SEED LOCALIZATION X2;  Surgeon: Erroll Luna, MD;  Location: [place];  Service: General;  Laterality: Left;  . CHOLECYSTECTOMY    .  COLONOSCOPY WITH PROPOFOL N/A 08/05/2018   Procedure: COLONOSCOPY WITH PROPOFOL;  Surgeon: Daneil Dolin, MD;  [facility];  Service: Endoscopy;  Laterality: N/A;  12:45pm  . TONSILLECTOMY       ALLERGIES:  Allergies  Allergen Reactions  . Codeine Other (See Comments)    Cold sweats     CURRENT MEDICATIONS:  Outpatient Encounter Medications as of 04/11/2019  Medication Sig  . amLODipine (NORVASC) 10 MG tablet Take 1 tablet (10 mg total) by mouth daily.  Marland Kitchen atenolol (TENORMIN) 25 MG tablet Take 1 tablet (25 mg total) by mouth daily.  . Blood Pressure Monitor DEVI Use as directed to check home blood pressure 2-3 times a week  . busPIRone (BUSPAR) 15 MG tablet Take 1 tablet (15 mg total) by mouth 2 (two) times daily.  Marland Kitchen ibuprofen (ADVIL,MOTRIN) 200 MG tablet Take 200 mg by mouth every 6 (six) hours as needed.  . Multiple Vitamin (MULTIVITAMIN WITH MINERALS) TABS tablet Take 1 tablet by mouth daily.   . tamoxifen (NOLVADEX) 20 MG tablet Take 1 tablet (20 mg total) by mouth daily.  . traZODone (DESYREL) 50 MG tablet Take 0.5 tablets (25 mg total) by mouth at bedtime.   No facility-administered encounter medications on file as of 04/11/2019.      ONCOLOGIC FAMILY HISTORY:  Family History  Problem Relation Age of Onset  . Breast cancer Mother 7  . Cancer Maternal Grandmother        thinks it was braest, but not sure what type of cancer, dx >50  . Heart disease Paternal Grandfather   . Lung cancer Maternal Aunt   . Colon cancer Neg Hx      GENETIC COUNSELING/TESTING: negative  SOCIAL HISTORY:  Social History   Socioeconomic History  . Marital status: Single    Spouse name: Not on file  . Number of children: Not on file  . Years of education: Not on file  . Highest education level: Not on file  Occupational History  . Not on file  Social Needs  . Financial resource strain: Not on file  . Food insecurity    Worry: Not on file    Inability: Not on file  .  Transportation needs    Medical: Not on file    Non-medical: Not on file  Tobacco Use  . Smoking status: Never Smoker  . Smokeless tobacco: Never Used  Substance and Sexual Activity  . Alcohol use: No    Alcohol/week: 0.0 standard drinks  . Drug use: No  . Sexual activity: Yes    Birth control/protection: Post-menopausal  Lifestyle  . Physical activity    Days per week: Not on file    Minutes per session: Not on file  . Stress: Not on file  Relationships  . Social Herbalist on phone: Not on file    Gets together: Not on file    Attends religious service: Not on file    Active member of club or organization: Not on file    Attends meetings of clubs or organizations: Not on file    Relationship status: Not on file  . Intimate partner violence    Fear of current or ex  partner: Not on file    Emotionally abused: Not on file    Physically abused: Not on file    Forced sexual activity: Not on file  Other Topics Concern  . Not on file  Social History Narrative  . Not on file     OBSERVATIONS/OBJECTIVE:  Patient sounds well.  She is in no apparent distress.  Her mood and behavior are normal.  Breathing is non labored.    LABORATORY DATA:  None for this visit.  DIAGNOSTIC IMAGING:  None for this visit.      ASSESSMENT AND PLAN:  Ms.. Tison is a pleasant 63 y.o. female with Stage 0 left breast DCIS, ER+/PR+, diagnosed in 08/2018, treated with lumpectomy, adjuvant radiation therapy, and anti-estrogen therapy with Tamoxifen beginning in 01/2019.  She presents to the Survivorship Clinic for our initial meeting and routine follow-up post-completion of treatment for breast cancer.    1. Stage 0 left breast cancer:  Ms. Pendry is continuing to recover from definitive treatment for breast cancer. She will follow-up with her medical oncologist, Dr. Lindi Adie in 08/2019 with history and physical exam per surveillance protocol.  She will continue her anti-estrogen therapy  with Tamoxifen. Thus far, she is tolerating the Tamoxifen well, with minimal side effects. She was instructed to make Dr. Lindi Adie or myself aware if she begins to experience any worsening side effects of the medication and I could see her back in clinic to help manage those side effects, as needed. Her mammogram is due 08/2019; orders placed today.  Today, a comprehensive survivorship care plan and treatment summary was reviewed with the patient today detailing her breast cancer diagnosis, treatment course, potential late/long-term effects of treatment, appropriate follow-up care with recommendations for the future, and patient education resources.  A copy of this summary, along with a letter will be sent to the patient's primary care provider via mail/fax/In Basket message after today's visit.    2. Sleep disturbance: She is taking Trazodone and wants to know if she can increase this.  I recommended she f/u with her PCP who prescribed this to her regarding guidance on the medication.    3. Bone health:  Given Ms. Rua's age/history of breast cancer, she is at risk for bone demineralization.  She has not yet undergone bone density testing.  I have ordered this for 08/2019 when she has her mammogram.  In the meantime, she was encouraged to increase her consumption of foods rich in calcium, as well as increase her weight-bearing activities.  She was given education on specific activities to promote bone health.  4. Cancer screening:  Due to Ms. Brevik's history and her age, she should receive screening for skin cancers, colon cancer, and gynecologic cancers.  The information and recommendations are listed on the patient's comprehensive care plan/treatment summary and were reviewed in detail with the patient.    5. Health maintenance and wellness promotion: Ms. Montelongo was encouraged to consume 5-7 servings of fruits and vegetables per day. We reviewed the "Nutrition Rainbow" handout, as well as the  handout "Take Control of Your Health and Reduce Your Cancer Risk" from the [employer].  She was also encouraged to engage in moderate to vigorous exercise for 30 minutes per day most days of the week. We discussed the [employer] fitness program, which is designed for cancer survivors to help them become more physically fit after cancer treatments.  She was instructed to limit her alcohol consumption and continue to abstain from tobacco  use.     6. Support services/counseling: It is not uncommon for this period of the patient's cancer care trajectory to be one of many emotions and stressors.  We discussed how this can be increasingly difficult during the times of quarantine and social distancing due to the COVID-19 pandemic.   She was given information regarding our available services and encouraged to contact me with any questions or for help enrolling in any of our support group/programs.    Follow up instructions:    -Return to cancer center 09/2019 for f/u with Dr. Lindi Adie  -Mammogram due in 08/2019 -Bone Density 08/2019 -Follow up with surgery June 2020. -She is welcome to return back to the Survivorship Clinic at any time; no additional follow-up needed at this time.  -Consider referral back to survivorship as a long-term survivor for continued surveillance  The patient was provided an opportunity to ask questions and all were answered. The patient agreed with the plan and demonstrated an understanding of the instructions.   The patient was advised to call back or seek an in-person evaluation if the symptoms worsen or if the condition fails to improve as anticipated.   I provided 21 minutes of non face-to-face telephone visit time during this encounter, and > 50% was spent counseling as documented under my assessment & plan.  Scot Dock, NP

## 2019-04-12 ENCOUNTER — Telehealth: Payer: Self-pay | Admitting: Hematology and Oncology

## 2019-04-12 NOTE — Telephone Encounter (Signed)
I talk with patient regarding schedule  

## 2019-07-19 ENCOUNTER — Encounter (HOSPITAL_COMMUNITY): Payer: Self-pay

## 2019-08-12 ENCOUNTER — Other Ambulatory Visit: Payer: Self-pay

## 2019-08-12 ENCOUNTER — Ambulatory Visit: Payer: Medicaid Other | Attending: Internal Medicine | Admitting: Internal Medicine

## 2019-08-12 ENCOUNTER — Encounter: Payer: Self-pay | Admitting: Internal Medicine

## 2019-08-12 VITALS — BP 118/78 | HR 83 | Temp 98.2°F | Resp 16 | Wt 155.8 lb

## 2019-08-12 DIAGNOSIS — Z803 Family history of malignant neoplasm of breast: Secondary | ICD-10-CM | POA: Diagnosis not present

## 2019-08-12 DIAGNOSIS — Z923 Personal history of irradiation: Secondary | ICD-10-CM | POA: Insufficient documentation

## 2019-08-12 DIAGNOSIS — G252 Other specified forms of tremor: Secondary | ICD-10-CM | POA: Insufficient documentation

## 2019-08-12 DIAGNOSIS — Z853 Personal history of malignant neoplasm of breast: Secondary | ICD-10-CM | POA: Insufficient documentation

## 2019-08-12 DIAGNOSIS — R21 Rash and other nonspecific skin eruption: Secondary | ICD-10-CM | POA: Diagnosis not present

## 2019-08-12 DIAGNOSIS — R351 Nocturia: Secondary | ICD-10-CM | POA: Diagnosis not present

## 2019-08-12 DIAGNOSIS — R358 Other polyuria: Secondary | ICD-10-CM | POA: Insufficient documentation

## 2019-08-12 DIAGNOSIS — Z885 Allergy status to narcotic agent status: Secondary | ICD-10-CM | POA: Insufficient documentation

## 2019-08-12 DIAGNOSIS — Z760 Encounter for issue of repeat prescription: Secondary | ICD-10-CM | POA: Insufficient documentation

## 2019-08-12 DIAGNOSIS — F419 Anxiety disorder, unspecified: Secondary | ICD-10-CM | POA: Diagnosis not present

## 2019-08-12 DIAGNOSIS — I10 Essential (primary) hypertension: Secondary | ICD-10-CM | POA: Diagnosis not present

## 2019-08-12 DIAGNOSIS — R3581 Nocturnal polyuria: Secondary | ICD-10-CM

## 2019-08-12 DIAGNOSIS — F99 Mental disorder, not otherwise specified: Secondary | ICD-10-CM

## 2019-08-12 DIAGNOSIS — Z9012 Acquired absence of left breast and nipple: Secondary | ICD-10-CM | POA: Insufficient documentation

## 2019-08-12 DIAGNOSIS — Z79899 Other long term (current) drug therapy: Secondary | ICD-10-CM | POA: Diagnosis not present

## 2019-08-12 DIAGNOSIS — F5105 Insomnia due to other mental disorder: Secondary | ICD-10-CM | POA: Insufficient documentation

## 2019-08-12 LAB — POCT GLYCOSYLATED HEMOGLOBIN (HGB A1C): Hemoglobin A1C: 5.6 % (ref 4.0–5.6)

## 2019-08-12 MED ORDER — TRAZODONE HCL 50 MG PO TABS: 50.0000 mg | ORAL_TABLET | Freq: Every day | ORAL | 3 refills | Status: DC

## 2019-08-12 MED ORDER — ATENOLOL 25 MG PO TABS: 25.0000 mg | ORAL_TABLET | Freq: Every day | ORAL | 3 refills | Status: DC

## 2019-08-12 MED ORDER — TRIAMCINOLONE ACETONIDE 0.1 % EX CREA: 1.0000 "application " | TOPICAL_CREAM | Freq: Two times a day (BID) | CUTANEOUS | 0 refills | Status: DC

## 2019-08-12 MED ORDER — BUSPIRONE HCL 15 MG PO TABS: 15.0000 mg | ORAL_TABLET | Freq: Two times a day (BID) | ORAL | 3 refills | Status: DC

## 2019-08-12 NOTE — Progress Notes (Signed)
Patient ID: Kimberly [name], female    DOB: 09/27/1956  MRN: [id number]  CC: Medication Refill   Subjective: [name] is a 63 y.o. 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  HM:  got free flu shot at church 07/14/2019  HYPERTENSION Currently taking: see medication list Med Adherence: [x]  Yes    []  No Medication side effects: []  Yes    [x]  No Adherence with salt restriction: [x]  Yes    []  No Home Monitoring?: []  Yes    [x]  No Monitoring Frequency: []  Yes    []  No Home BP results range: []  Yes    []  No SOB? []  Yes    [x]  No Chest Pain?: []  Yes    [x]  No Leg swelling?: []  Yes    [x]  No Headaches?: []  Yes    [x]  No Dizziness? []  Yes    [x]  No Comments:   C/o Rash on both legs over shin x 1 mth Itches at times No initiatng factors Using OTC Shea Butter on it  Insomnia:  Would like trazodone increase to 50 mg instead of half a pill.  C/o frequent urination at nights x 1-2 yrs.  More frequent more recently Wakes  Up 3-4 x at nights. No dysuria.  No incontinence Drinks some water to take Trazodone and Buspar.  She does not drink  coffeee or sodas.    Patient Active Problem List   Diagnosis Date Noted  . History of rheumatic fever as a child 03/17/2019  . Genetic testing 10/25/2018  . Family history of breast cancer   . Family history of lung cancer   . Ductal carcinoma in situ (DCIS) of left breast 09/07/2018  . Intention tremor 10/15/2017  . Diabetes mellitus screening 06/10/2017  . Breast nodule 03/02/2016  . Seasonal allergies 01/07/2016  . Right shoulder pain 09/28/2015  . Insomnia 07/06/2015  . Tinea pedis of both feet 07/06/2015  . Nocturia 08/25/2014  . Chronic anxiety 08/25/2014  . Heme positive stool 08/16/2014  . HTN (hypertension) 08/16/2014     Current Outpatient Medications on File Prior to Visit   Medication Sig Dispense Refill  . amLODipine (NORVASC) 10 MG tablet Take 1 tablet (10 mg total) by mouth daily. 30 tablet 6  . atenolol (TENORMIN) 25 MG tablet Take 1 tablet (25 mg total) by mouth daily. 90 tablet 3  . Blood Pressure Monitor DEVI Use as directed to check home blood pressure 2-3 times a week 1 Device 0  . busPIRone (BUSPAR) 15 MG tablet Take 1 tablet (15 mg total) by mouth 2 (two) times daily. 180 tablet 3  . ibuprofen (ADVIL,MOTRIN) 200 MG tablet Take 200 mg by mouth every 6 (six) hours as needed.    . Multiple Vitamin (MULTIVITAMIN WITH MINERALS) TABS tablet Take 1 tablet by mouth daily.     . tamoxifen (NOLVADEX) 20 MG tablet Take 1 tablet (20 mg total) by mouth daily. 90 tablet 3  . traZODone (DESYREL) 50 MG tablet Take 0.5 tablets (25 mg total) by mouth at bedtime. 30 tablet 3   No current facility-administered medications on file prior to visit.     Allergies  Allergen Reactions  . Codeine Other (See Comments)    Cold sweats    Social History   Socioeconomic History  . Marital status: Single    Spouse name: Not on file  .  Number of children: Not on file  . Years of education: Not on file  . Highest education level: Not on file  Occupational History  . Not on file  Social Needs  . Financial resource strain: Not on file  . Food insecurity    Worry: Not on file    Inability: Not on file  . Transportation needs    Medical: Not on file    Non-medical: Not on file  Tobacco Use  . Smoking status: Never Smoker  . Smokeless tobacco: Never Used  Substance and Sexual Activity  . Alcohol use: No    Alcohol/week: 0.0 standard drinks  . Drug use: No  . Sexual activity: Yes    Birth control/protection: Post-menopausal  Lifestyle  . Physical activity    Days per week: Not on file    Minutes per session: Not on file  . Stress: Not on file  Relationships  . Social Herbalist on phone: Not on file    Gets together: Not on file    Attends religious  service: Not on file    Active member of club or organization: Not on file    Attends meetings of clubs or organizations: Not on file    Relationship status: Not on file  . Intimate partner violence    Fear of current or ex partner: Not on file    Emotionally abused: Not on file    Physically abused: Not on file    Forced sexual activity: Not on file  Other Topics Concern  . Not on file  Social History Narrative  . Not on file    Family History  Problem Relation Age of Onset  . Breast cancer Mother 60  . Cancer Maternal Grandmother        thinks it was braest, but not sure what type of cancer, dx >50  . Heart disease Paternal Grandfather   . Lung cancer Maternal Aunt   . Colon cancer Neg Hx     Past Surgical History:  Procedure Laterality Date  . APPENDECTOMY    . bowel obstruction    . BREAST LUMPECTOMY WITH RADIOACTIVE SEED LOCALIZATION Left 10/14/2018   Procedure: BREAST LUMPECTOMY WITH RADIOACTIVE SEED LOCALIZATION X2;  Surgeon: Erroll Luna, MD;  Location: [place];  Service: General;  Laterality: Left;  . CHOLECYSTECTOMY    . COLONOSCOPY WITH PROPOFOL N/A 08/05/2018   Procedure: COLONOSCOPY WITH PROPOFOL;  Surgeon: Daneil Dolin, MD;  [facility];  Service: Endoscopy;  Laterality: N/A;  12:45pm  . TONSILLECTOMY      ROS: Review of Systems Negative except as stated above  PHYSICAL EXAM: BP 118/78   Pulse 83   Temp 98.2 F (36.8 C) (Oral)   Resp 16   Wt 155 lb 12.8 oz (70.7 kg)   SpO2 98%   BMI 25.93 kg/m   Physical Exam  General appearance - alert, well appearing, and in no distress Mental status - normal mood, behavior, speech, dress, motor activity, and thought processes Neck - supple, no significant adenopathy Chest - clear to auscultation, no wheezes, rales or rhonchi, symmetric air entry Heart - normal rate, regular rhythm, normal S1, S2, no murmurs, rubs, clicks or gallops Neurological -mild tremor of the head Abdomen: Nondistended,  nontender, no organomegaly Extremities - peripheral pulses normal, no pedal edema, no clubbing or cyanosis Skin -hyperpigmented scaly rash over the mid shin bilaterally.  Please see photo below      CMP Latest Ref  Rng & Units 10/08/2018 09/15/2018 08/03/2018  Glucose 70 - 99 mg/dL 104(H) 104(H) 164(H)  BUN 8 - 23 mg/dL 15 15 10   Creatinine 0.44 - 1.00 mg/dL 0.95 0.92 0.87  Sodium 135 - 145 mmol/L 139 141 139  Potassium 3.5 - 5.1 mmol/L 4.6 4.8 3.6  Chloride 98 - 111 mmol/L 107 109 107  CO2 22 - 32 mmol/L 25 23 24   Calcium 8.9 - 10.3 mg/dL 9.9 9.8 9.3  Total Protein 6.5 - 8.1 g/dL 7.8 7.7 -  Total Bilirubin 0.3 - 1.2 mg/dL 0.4 0.4 -  Alkaline Phos 38 - 126 U/L 74 87 -  AST 15 - 41 U/L 19 16 -  ALT 0 - 44 U/L 15 11 -   Lipid Panel     Component Value Date/Time   CHOL 192 03/02/2018 1212   TRIG 114 03/02/2018 1212   HDL 64 03/02/2018 1212   CHOLHDL 3.0 03/02/2018 1212   CHOLHDL 2.7 11/13/2016 1102   VLDL 25 11/13/2016 1102   LDLCALC 105 (H) 03/02/2018 1212    CBC    Component Value Date/Time   WBC 6.1 10/08/2018 0919   RBC 4.84 10/08/2018 0919   HGB 14.7 10/08/2018 0919   HGB 13.6 09/15/2018 0820   HGB 15.1 03/02/2018 1212   HCT 45.0 10/08/2018 0919   HCT 44.7 03/02/2018 1212   PLT 473 (H) 10/08/2018 0919   PLT 423 (H) 09/15/2018 0820   PLT 418 03/02/2018 1212   MCV 93.0 10/08/2018 0919   MCV 90 03/02/2018 1212   MCH 30.4 10/08/2018 0919   MCHC 32.7 10/08/2018 0919   RDW 12.7 10/08/2018 0919   RDW 14.1 03/02/2018 1212   LYMPHSABS 1.9 10/08/2018 0919   MONOABS 0.6 10/08/2018 0919   EOSABS 0.2 10/08/2018 0919   BASOSABS 0.1 10/08/2018 0919   Results for orders placed or performed in visit on 08/12/19  POCT glycosylated hemoglobin (Hb A1C)  Result Value Ref Range   Hemoglobin A1C 5.6 4.0 - 5.6 %   HbA1c POC (<> result, manual entry)     HbA1c, POC (prediabetic range)     HbA1c, POC (controlled diabetic range)      ASSESSMENT AND PLAN:  1. Essential  hypertension At goal.  Continue amlodipine and atenolol - atenolol (TENORMIN) 25 MG tablet; Take 1 tablet (25 mg total) by mouth daily.  Dispense: 90 tablet; Refill: 3  2. Insomnia due to other mental disorder  - traZODone (DESYREL) 50 MG tablet; Take 1 tablet (50 mg total) by mouth at bedtime.  Dispense: 30 tablet; Refill: 3  3. Intention tremor - atenolol (TENORMIN) 25 MG tablet; Take 1 tablet (25 mg total) by mouth daily.  Dispense: 90 tablet; Refill: 3  4. Chronic anxiety  - busPIRone (BUSPAR) 15 MG tablet; Take 1 tablet (15 mg total) by mouth 2 (two) times daily.  Dispense: 180 tablet; Refill: 3  5. Rash and nonspecific skin eruption Of questionable etiology. - Ambulatory referral to Dermatology - triamcinolone cream (KENALOG) 0.1 %; Apply 1 application topically 2 (two) times daily.  Dispense: 45 g; Refill: 0  6. Nocturnal polyuria - Urinalysis - POCT glycosylated hemoglobin (Hb A1C)    Patient was given the opportunity to ask questions.  Patient verbalized understanding of the plan and was able to repeat key elements of the plan.   No orders of the defined types were placed in this encounter.    Requested Prescriptions   Pending Prescriptions Disp Refills  . atenolol (TENORMIN) 25 MG  tablet 90 tablet 3    Sig: Take 1 tablet (25 mg total) by mouth daily.  . busPIRone (BUSPAR) 15 MG tablet 180 tablet 3    Sig: Take 1 tablet (15 mg total) by mouth 2 (two) times daily.  . traZODone (DESYREL) 50 MG tablet 30 tablet 3    Sig: Take 0.5 tablets (25 mg total) by mouth at bedtime.    No follow-ups on file.  Karle Plumber, MD, FACP

## 2019-09-11 NOTE — Progress Notes (Signed)
Patient Care Team: Ladell Pier, MD as PCP - General (Internal Medicine) Gala Romney Cristopher Estimable, MD as Consulting Physician (Gastroenterology) Erroll Luna, MD as Consulting Physician (General Surgery) Nicholas Lose, MD as Consulting Physician (Hematology and Oncology) Kyung Rudd, MD as Consulting Physician (Radiation Oncology)  DIAGNOSIS:    ICD-10-CM   1. Ductal carcinoma in situ (DCIS) of left breast  D05.12     SUMMARY OF ONCOLOGIC HISTORY: Oncology History  Ductal carcinoma in situ (DCIS) of left breast  09/07/2018 Initial Diagnosis   Bilateral masses and calcifications, right breast benign, left breast UOQ 3.2 cm biopsy revealed intermediate grade to high-grade DCIS ER 100%, PR 100%, 4 o'clock position retroareolar calcifications 2.2 cm biopsy revealed intermediate grade DCIS with [facility] ER 100%, PR 90%, both lesions are 5.5 cm apart, Tis NX stage 0   10/14/2018 Surgery   Left breast lumpectomy x2: 2 foci of DCIS intermediate grade 1.6 cm and 1.1 cm, LCIS, final margins negative.  The smaller DCIS 0.1 cm from inferior margin, ER 100%, PR 90%, Tis NX stage 0   10/27/2018 Cancer Staging   Staging form: Breast, AJCC 8th Edition - Pathologic: Stage 0 (pTis (DCIS), pN0, cM0, ER+, PR+) - Signed by Gardenia Phlegm, NP on 10/27/2018   11/23/2018 - 12/17/2018 Radiation Therapy   Adjuvant radiation   01/2019 -  Anti-estrogen oral therapy   Tamoxifen daily     CHIEF COMPLIANT: Follow-up of left breast DCIS on tamoxifen  INTERVAL HISTORY: Kimberly [name] is a 63 y.o. with above-mentioned history of left breast DCIS treated with a lumpectomy x2, radiation, and is currently on tamoxifen. She presents to the clinic today for follow-up.    REVIEW OF SYSTEMS:   Constitutional: Denies fevers, chills or abnormal weight loss Eyes: Denies blurriness of vision Ears, nose, mouth, throat, and face: Denies mucositis or sore throat Respiratory: Denies cough, dyspnea or wheezes  Cardiovascular: Denies palpitation, chest discomfort Gastrointestinal: Denies nausea, heartburn or change in bowel habits Skin: Denies abnormal skin rashes Lymphatics: Denies new lymphadenopathy or easy bruising Neurological: Denies numbness, tingling or new weaknesses Behavioral/Psych: Mood is stable, no new changes  Extremities: No lower extremity edema Breast: denies any pain or lumps or nodules in either breasts All other systems were reviewed with the patient and are negative.  I have reviewed the past medical history, past surgical history, social history and family history with the patient and they are unchanged from previous note.  ALLERGIES:  is allergic to codeine.  MEDICATIONS:  Current Outpatient Medications  Medication Sig Dispense Refill  . amLODipine (NORVASC) 10 MG tablet Take 1 tablet (10 mg total) by mouth daily. 30 tablet 6  . atenolol (TENORMIN) 25 MG tablet Take 1 tablet (25 mg total) by mouth daily. 90 tablet 3  . Blood Pressure Monitor DEVI Use as directed to check home blood pressure 2-3 times a week 1 Device 0  . busPIRone (BUSPAR) 15 MG tablet Take 1 tablet (15 mg total) by mouth 2 (two) times daily. 180 tablet 3  . ibuprofen (ADVIL,MOTRIN) 200 MG tablet Take 200 mg by mouth every 6 (six) hours as needed.    . Multiple Vitamin (MULTIVITAMIN WITH MINERALS) TABS tablet Take 1 tablet by mouth daily.     . tamoxifen (NOLVADEX) 20 MG tablet Take 1 tablet (20 mg total) by mouth daily. 90 tablet 3  . traZODone (DESYREL) 50 MG tablet Take 1 tablet (50 mg total) by mouth at bedtime. 30 tablet 3  . triamcinolone  cream (KENALOG) 0.1 % Apply 1 application topically 2 (two) times daily. 45 g 0   No current facility-administered medications for this visit.     PHYSICAL EXAMINATION: ECOG PERFORMANCE STATUS: 1 - Symptomatic but completely ambulatory  Vitals:   09/12/19 1146  BP: 117/74  Pulse: 70  Resp: 18  Temp: 98 F (36.7 C)  SpO2: 99%   Filed Weights    09/12/19 1146  Weight: 152 lb 11.2 oz (69.3 kg)    GENERAL: alert, no distress and comfortable SKIN: skin color, texture, turgor are normal, no rashes or significant lesions EYES: normal, Conjunctiva are pink and non-injected, sclera clear OROPHARYNX: no exudate, no erythema and lips, buccal mucosa, and tongue normal  NECK: supple, thyroid normal size, non-tender, without nodularity LYMPH: no palpable lymphadenopathy in the cervical, axillary or inguinal LUNGS: clear to auscultation and percussion with normal breathing effort HEART: regular rate & rhythm and no murmurs and no lower extremity edema ABDOMEN: abdomen soft, non-tender and normal bowel sounds MUSCULOSKELETAL: no cyanosis of digits and no clubbing  NEURO: alert & oriented x 3 with fluent speech, no focal motor/sensory deficits EXTREMITIES: No lower extremity edema  LABORATORY DATA:  I have reviewed the data as listed CMP Latest Ref Rng & Units 10/08/2018 09/15/2018 08/03/2018  Glucose 70 - 99 mg/dL 104(H) 104(H) 164(H)  BUN 8 - 23 mg/dL 15 15 10   Creatinine 0.44 - 1.00 mg/dL 0.95 0.92 0.87  Sodium 135 - 145 mmol/L 139 141 139  Potassium 3.5 - 5.1 mmol/L 4.6 4.8 3.6  Chloride 98 - 111 mmol/L 107 109 107  CO2 22 - 32 mmol/L 25 23 24   Calcium 8.9 - 10.3 mg/dL 9.9 9.8 9.3  Total Protein 6.5 - 8.1 g/dL 7.8 7.7 -  Total Bilirubin 0.3 - 1.2 mg/dL 0.4 0.4 -  Alkaline Phos 38 - 126 U/L 74 87 -  AST 15 - 41 U/L 19 16 -  ALT 0 - 44 U/L 15 11 -    Lab Results  Component Value Date   WBC 6.1 10/08/2018   HGB 14.7 10/08/2018   HCT 45.0 10/08/2018   MCV 93.0 10/08/2018   PLT 473 (H) 10/08/2018   NEUTROABS 3.3 10/08/2018    ASSESSMENT & PLAN:  Ductal carcinoma in situ (DCIS) of left breast 10/14/2018:Left breast lumpectomy x2: 2 foci of DCIS intermediate grade 1.6 cm and 1.1 cm, LCIS, final margins negative. The smaller DCIS 0.1 cm from inferior margin, ER 100%, PR 90%, Tis NX stage 0  Treatment plan: 1.Adjuvant  radiation therapy 11/23/2018-12/14/2018 2.Followed by adjuvant antiestrogen therapy with tamoxifen x5 years  Tamoxifen toxicities: Occasional hot flashes but no other side effects.  Breast cancer surveillance: 1.  Breast exam 09/12/2019: Benign 2.  Mammogram scheduled for 09/14/2019  I renewed her prescription for tamoxifen. Return to clinic in 1 year for follow-up    No orders of the defined types were placed in this encounter.  The patient has a good understanding of the overall plan. she agrees with it. she will call with any problems that may develop before the next visit here.  Nicholas Lose, MD 09/12/2019  Julious Oka Dorshimer, am acting as scribe for Dr. Nicholas Lose.  I have reviewed the above documentation for accuracy and completeness, and I agree with the above.

## 2019-09-12 ENCOUNTER — Inpatient Hospital Stay: Payer: Medicaid Other | Attending: Hematology and Oncology | Admitting: Hematology and Oncology

## 2019-09-12 ENCOUNTER — Other Ambulatory Visit: Payer: Self-pay

## 2019-09-12 ENCOUNTER — Telehealth: Payer: Self-pay | Admitting: Hematology and Oncology

## 2019-09-12 DIAGNOSIS — Z923 Personal history of irradiation: Secondary | ICD-10-CM | POA: Diagnosis not present

## 2019-09-12 DIAGNOSIS — D0512 Intraductal carcinoma in situ of left breast: Secondary | ICD-10-CM

## 2019-09-12 DIAGNOSIS — Z79810 Long term (current) use of selective estrogen receptor modulators (SERMs): Secondary | ICD-10-CM | POA: Insufficient documentation

## 2019-09-12 MED ORDER — TAMOXIFEN CITRATE 20 MG PO TABS: 20.0000 mg | ORAL_TABLET | Freq: Every day | ORAL | 3 refills | Status: DC

## 2019-09-12 NOTE — Assessment & Plan Note (Signed)
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 The smaller DCIS 0.1 cm from inferior margin, ER 100%, PR 90%, Tis NX stage 0  Treatment plan: 1.Adjuvant radiation therapy 11/23/2018-12/14/2018 2.Followed by adjuvant antiestrogen therapy with tamoxifen x5 years  Tamoxifen toxicities:  Breast cancer surveillance: 1.  Breast exam 09/12/2019: Benign 2.  Mammogram scheduled for 09/14/2019  I renewed her prescription for tamoxifen. Return to clinic in 1 year for follow-up

## 2019-09-12 NOTE — Telephone Encounter (Signed)
I talk with patient regarding schedule  

## 2019-09-14 ENCOUNTER — Telehealth: Payer: Self-pay | Admitting: *Deleted

## 2019-09-14 ENCOUNTER — Other Ambulatory Visit: Payer: Self-pay

## 2019-09-14 ENCOUNTER — Ambulatory Visit
Admission: RE | Admit: 2019-09-14 | Discharge: 2019-09-14 | Disposition: A | Discharge status: Home / Self Care | Payer: Medicare Other | Source: Ambulatory Visit | Attending: Adult Health | Admitting: Adult Health

## 2019-09-14 DIAGNOSIS — D0512 Intraductal carcinoma in situ of left breast: Secondary | ICD-10-CM

## 2019-09-14 DIAGNOSIS — E2839 Other primary ovarian failure: Secondary | ICD-10-CM

## 2019-09-14 NOTE — Telephone Encounter (Signed)
[id number]/tcf-Mckala/wanting to know about hud housing for her son Kimberly [name].  Instructed her to with him and take all his financial record to the dept of social services. In order to see if he qualifies and to get him some helo with medical problems, a regular md and an orange card.  Stated that she will do this and call back if she has any problems.

## 2019-10-10 ENCOUNTER — Other Ambulatory Visit: Payer: Medicaid Other

## 2019-10-28 ENCOUNTER — Other Ambulatory Visit: Payer: Self-pay | Admitting: Internal Medicine

## 2019-10-28 DIAGNOSIS — I10 Essential (primary) hypertension: Secondary | ICD-10-CM

## 2019-11-12 ENCOUNTER — Other Ambulatory Visit: Payer: Self-pay | Admitting: Internal Medicine

## 2019-11-12 DIAGNOSIS — I10 Essential (primary) hypertension: Secondary | ICD-10-CM

## 2019-11-12 DIAGNOSIS — G252 Other specified forms of tremor: Secondary | ICD-10-CM

## 2019-11-14 NOTE — Telephone Encounter (Signed)
They were requesting refills. 90-day supply with 3 refills was sent 07/2019 so the refill was too soon.

## 2019-11-14 NOTE — Telephone Encounter (Signed)
May you look into this please

## 2019-11-14 NOTE — Telephone Encounter (Signed)
Patient called and requested for pcp to give authorization to [facility] on [address] for listed medication. Please fu at your earliest convenience. Patient stated she had enough medication to last her through tomorrow.    Atenolol 25mg .

## 2019-11-15 NOTE — Telephone Encounter (Signed)
Contacted [facility] and spoke with Imani. Per imani the last rx they received for Atenolol was back in April. I made imani aware that we have sent a rx for Atenolol back on 08/12/19 with a 90 supply with 3 refills Per Imani they never received rx. Contacted pt to get more clarification per pt she has the medication there was a issues with [facility] but she has the medication and doesn't have any questions or concerns

## 2019-11-28 ENCOUNTER — Ambulatory Visit: Payer: Medicare Other | Attending: Internal Medicine

## 2019-11-28 ENCOUNTER — Other Ambulatory Visit: Payer: Self-pay

## 2019-11-28 DIAGNOSIS — Z23 Encounter for immunization: Secondary | ICD-10-CM

## 2019-11-28 NOTE — Progress Notes (Signed)
   Covid-19 Vaccination Clinic  Name:  Kimberly [name]    MRN: [id number] DOB: Apr 11, 1956  11/28/2019  [name] was observed post Covid-19 immunization for 30 minutes based on pre-vaccination screening without incidence. She was provided with Vaccine Information Sheet and instruction to access the V-Safe system.   [name] was instructed to call 911 with any severe reactions post vaccine: Marland Kitchen Difficulty breathing  . Swelling of your face and throat  . A fast heartbeat  . A bad rash all over your body  . Dizziness and weakness    Immunizations Administered    Name Date Dose VIS Date Route   Moderna COVID-19 Vaccine 11/28/2019  9:35 AM 0.5 mL 09/13/2019 Intramuscular   Manufacturer: Moderna   Lot: [id number]   [id number]

## 2019-12-05 ENCOUNTER — Telehealth: Payer: Self-pay | Admitting: *Deleted

## 2019-12-05 NOTE — Telephone Encounter (Signed)
[id number]/tcf-Kimberly [name], was exposed to someone with positive covid last week, not showing any symtpoms, suggested that she wear a mask at all times around her family.  Discussed s&s to watch for.  She has had her first covid vaccine on [id number]. Stated that if she starts running a fever or any congestion and body aches that she will contact her md.

## 2019-12-08 ENCOUNTER — Other Ambulatory Visit: Payer: Self-pay | Admitting: Internal Medicine

## 2019-12-08 DIAGNOSIS — F5105 Insomnia due to other mental disorder: Secondary | ICD-10-CM

## 2019-12-13 ENCOUNTER — Other Ambulatory Visit: Payer: Medicare Other

## 2019-12-15 ENCOUNTER — Other Ambulatory Visit: Payer: Self-pay

## 2019-12-15 ENCOUNTER — Ambulatory Visit: Payer: Medicare Other | Attending: Internal Medicine | Admitting: Internal Medicine

## 2019-12-15 DIAGNOSIS — F5105 Insomnia due to other mental disorder: Secondary | ICD-10-CM

## 2019-12-15 DIAGNOSIS — F419 Anxiety disorder, unspecified: Secondary | ICD-10-CM | POA: Diagnosis not present

## 2019-12-15 DIAGNOSIS — R739 Hyperglycemia, unspecified: Secondary | ICD-10-CM | POA: Diagnosis not present

## 2019-12-15 DIAGNOSIS — I10 Essential (primary) hypertension: Secondary | ICD-10-CM

## 2019-12-15 DIAGNOSIS — F99 Mental disorder, not otherwise specified: Secondary | ICD-10-CM

## 2019-12-15 MED ORDER — TRAZODONE HCL 50 MG PO TABS: ORAL_TABLET | ORAL | 5 refills | Status: DC

## 2019-12-15 MED ORDER — AMLODIPINE BESYLATE 10 MG PO TABS: ORAL_TABLET | ORAL | 2 refills | Status: DC

## 2019-12-15 NOTE — Progress Notes (Signed)
Patient has been called and DOB has been verified. Patient has been screened and transferred to PCP to start phone visit.     

## 2019-12-15 NOTE — Progress Notes (Addendum)
Virtual Visit via Telephone Note Due to current restrictions/limitations of in-office visits due to the COVID-19 pandemic, this scheduled clinical appointment was converted to a telehealth visit  I connected with Kimberly [name] on 12/15/19 at 11:53 a.m by telephone and verified that I am speaking with the correct person using two identifiers. I am in my office.  The patient is at home.  Only the patient and myself participated in this encounter.  I discussed the limitations, risks, security and privacy concerns of performing an evaluation and management service by telephone and the availability of in person appointments. I also discussed with the patient that there may be a patient responsible charge related to this service. The patient expressed understanding and agreed to proceed.   History of Present Illness: Pt with hx ofDCIS left breast positive ER/PR treated with lumpectomy and XRT,anxiety, HTN and insomnia.Last seen 07/2019.  Today is for chronic disease management.  Insomnia:  Still not able to get much sleep on the increase dose of Trazodone. Wants to try Melatonin with the trazodone but was not sure if it would interact with her other medications.   Feels anxiety about COVID, not getting out of the house much and "kids being home." Taking the Buspar BID as prescribed.  Tremor in RT hand gets worse when she gets upset.  She has received the first of the [place].  HTN:  Compliant with Atenolol and Norvasc.  She has a device but has not check BP in a while.  -no CP/SOB/LE edema  Rash on legs:  Saw dermatologist x 2 since last visit.  Given a cream and rash has resolved.   HM:  Had 1st of Moderna vaccine; second shot scheduled for 12/27/2019    Current Outpatient Medications on File Prior to Visit  Medication Sig Dispense Refill  . amLODipine (NORVASC) 10 MG tablet TAKE 1 TABLET(10 MG) BY MOUTH DAILY 90 tablet 0  . atenolol (TENORMIN) 25 MG tablet Take 1 tablet (25 mg  total) by mouth daily. 90 tablet 3  . Blood Pressure Monitor DEVI Use as directed to check home blood pressure 2-3 times a week 1 Device 0  . busPIRone (BUSPAR) 15 MG tablet Take 1 tablet (15 mg total) by mouth 2 (two) times daily. 180 tablet 3  . ibuprofen (ADVIL,MOTRIN) 200 MG tablet Take 200 mg by mouth every 6 (six) hours as needed.    . Multiple Vitamin (MULTIVITAMIN WITH MINERALS) TABS tablet Take 1 tablet by mouth daily.     . tamoxifen (NOLVADEX) 20 MG tablet Take 1 tablet (20 mg total) by mouth daily. 90 tablet 3  . traZODone (DESYREL) 50 MG tablet TAKE 1 TABLET(50 MG) BY MOUTH AT BEDTIME 30 tablet 0  . triamcinolone cream (KENALOG) 0.1 % Apply 1 application topically 2 (two) times daily. 45 g 0   No current facility-administered medications on file prior to visit.    Observations/Objective: No direct observation done as this was a telephone encounter.  Assessment and Plan: 1. Essential hypertension Encourage patient to check blood pressure a few times a week.  Continue atenolol and amlodipine. - CBC; Future - Comprehensive metabolic panel; Future - Lipid panel; Future - amLODipine (NORVASC) 10 MG tablet; TAKE 1 TABLET(10 MG) BY MOUTH DAILY  Dispense: 90 tablet; Refill: 2  2. Insomnia due to other mental disorder Advised patient that it is okay to try the melatonin with the trazodone.  Advised to purchase the 3 mg melatonin over-the-counter and take 1 at bedtime -  traZODone (DESYREL) 50 MG tablet; TAKE 1 TABLET(50 MG) BY MOUTH AT BEDTIME  Dispense: 30 tablet; Refill: 5  3. Chronic anxiety Continue BuSpar.  She will keep her follow-up appointment later this month to get the second shot of Covid vaccine   Follow Up Instructions: 4 months   I discussed the assessment and treatment plan with the patient. The patient was provided an opportunity to ask questions and all were answered. The patient agreed with the plan and demonstrated an understanding of the instructions.    The patient was advised to call back or seek an in-person evaluation if the symptoms worsen or if the condition fails to improve as anticipated.  I provided 12 minutes of non-face-to-face time during this encounter.  Addendum 12/21/2019: Patient with random blood sugar of 202 on chemistry.  A1c added to labs drawn.   Karle Plumber, MD

## 2019-12-20 ENCOUNTER — Ambulatory Visit: Payer: Medicare Other | Attending: Internal Medicine

## 2019-12-20 ENCOUNTER — Other Ambulatory Visit: Payer: Self-pay

## 2019-12-20 DIAGNOSIS — I10 Essential (primary) hypertension: Secondary | ICD-10-CM

## 2019-12-21 LAB — LIPID PANEL
Chol/HDL Ratio: 2.6 ratio (ref 0.0–4.4)
Cholesterol, Total: 153 mg/dL (ref 100–199)
HDL: 59 mg/dL (ref >39)
LDL Chol Calc (NIH): 76 mg/dL (ref 0–99)
Triglycerides: 98 mg/dL (ref 0–149)
VLDL Cholesterol Cal: 18 mg/dL (ref 5–40)

## 2019-12-21 LAB — CBC
Hematocrit: 41.2 % (ref 34.0–46.6)
Hemoglobin: 14.1 g/dL (ref 11.1–15.9)
MCH: 31.4 pg (ref 26.6–33.0)
MCHC: 34.2 g/dL (ref 31.5–35.7)
MCV: 92 fL (ref 79–97)
Platelets: 404 10*3/uL (ref 150–450)
RBC: 4.49 x10E6/uL (ref 3.77–5.28)
RDW: 12.3 % (ref 11.7–15.4)
WBC: 5.4 10*3/uL (ref 3.4–10.8)

## 2019-12-21 LAB — COMPREHENSIVE METABOLIC PANEL
ALT: 9 IU/L (ref 0–32)
AST: 14 IU/L (ref 0–40)
Albumin/Globulin Ratio: 1.4 (ref 1.2–2.2)
Albumin: 4.2 g/dL (ref 3.8–4.8)
Alkaline Phosphatase: 63 IU/L (ref 39–117)
BUN/Creatinine Ratio: 12 (ref 12–28)
BUN: 11 mg/dL (ref 8–27)
Bilirubin Total: 0.2 mg/dL (ref 0.0–1.2)
CO2: 21 mmol/L (ref 20–29)
Calcium: 9.9 mg/dL (ref 8.7–10.3)
Chloride: 102 mmol/L (ref 96–106)
Creatinine, Ser: 0.95 mg/dL (ref 0.57–1.00)
GFR calc Af Amer: 73 mL/min/{1.73_m2} (ref >59)
GFR calc non Af Amer: 63 mL/min/{1.73_m2} (ref >59)
Globulin, Total: 2.9 g/dL (ref 1.5–4.5)
Glucose: 202 mg/dL — ABNORMAL HIGH (ref 65–99)
Potassium: 4.6 mmol/L (ref 3.5–5.2)
Sodium: 139 mmol/L (ref 134–144)
Total Protein: 7.1 g/dL (ref 6.0–8.5)

## 2019-12-21 NOTE — Addendum Note (Signed)
Addended by: Karle Plumber B on: 12/21/2019 09:44 AM   Modules accepted: Orders

## 2019-12-23 ENCOUNTER — Telehealth: Payer: Self-pay

## 2019-12-23 LAB — HEMOGLOBIN A1C
Est. average glucose Bld gHb Est-mCnc: 120 mg/dL
Hgb A1c MFr Bld: 5.8 % — ABNORMAL HIGH (ref 4.8–5.6)

## 2019-12-23 LAB — SPECIMEN STATUS REPORT

## 2019-12-23 NOTE — Telephone Encounter (Signed)
Contacted pt to go over lab results pt is aware and pt states she will try to change her eating habits. Pt doesn't doesn't have any questions or concerns

## 2019-12-27 ENCOUNTER — Other Ambulatory Visit: Payer: Self-pay

## 2019-12-27 ENCOUNTER — Ambulatory Visit: Payer: Medicare Other | Attending: Internal Medicine

## 2019-12-27 DIAGNOSIS — Z23 Encounter for immunization: Secondary | ICD-10-CM

## 2019-12-27 NOTE — Progress Notes (Signed)
   Covid-19 Vaccination Clinic  Name:  Kimberly [name]    MRN: [id number] DOB: August 21, 1956  12/27/2019  Kimberly [name] was observed post Covid-19 immunization for 15 minutes without incident. She was provided with Vaccine Information Sheet and instruction to access the V-Safe system.   Kimberly [name] was instructed to call 911 with any severe reactions post vaccine: Marland Kitchen Difficulty breathing  . Swelling of face and throat  . A fast heartbeat  . A bad rash all over body  . Dizziness and weakness   Immunizations Administered    Name Date Dose VIS Date Route   Moderna COVID-19 Vaccine 12/27/2019  9:30 AM 0.5 mL 09/13/2019 Intramuscular   Manufacturer: Moderna   Lot: [id number]   [id number]

## 2020-04-19 ENCOUNTER — Encounter: Payer: Self-pay | Admitting: Internal Medicine

## 2020-04-19 ENCOUNTER — Other Ambulatory Visit: Payer: Self-pay

## 2020-04-19 ENCOUNTER — Ambulatory Visit: Payer: Medicare Other | Attending: Internal Medicine | Admitting: Internal Medicine

## 2020-04-19 VITALS — BP 132/80 | HR 70 | Temp 97.0°F | Resp 16 | Wt 142.8 lb

## 2020-04-19 DIAGNOSIS — I10 Essential (primary) hypertension: Secondary | ICD-10-CM

## 2020-04-19 DIAGNOSIS — M25571 Pain in right ankle and joints of right foot: Secondary | ICD-10-CM | POA: Insufficient documentation

## 2020-04-19 DIAGNOSIS — M25561 Pain in right knee: Secondary | ICD-10-CM | POA: Diagnosis not present

## 2020-04-19 DIAGNOSIS — G252 Other specified forms of tremor: Secondary | ICD-10-CM

## 2020-04-19 DIAGNOSIS — Z885 Allergy status to narcotic agent status: Secondary | ICD-10-CM | POA: Diagnosis not present

## 2020-04-19 DIAGNOSIS — M25572 Pain in left ankle and joints of left foot: Secondary | ICD-10-CM | POA: Insufficient documentation

## 2020-04-19 DIAGNOSIS — R252 Cramp and spasm: Secondary | ICD-10-CM

## 2020-04-19 DIAGNOSIS — F419 Anxiety disorder, unspecified: Secondary | ICD-10-CM | POA: Diagnosis not present

## 2020-04-19 DIAGNOSIS — M25542 Pain in joints of left hand: Secondary | ICD-10-CM | POA: Insufficient documentation

## 2020-04-19 DIAGNOSIS — M79606 Pain in leg, unspecified: Secondary | ICD-10-CM | POA: Diagnosis not present

## 2020-04-19 DIAGNOSIS — Z8619 Personal history of other infectious and parasitic diseases: Secondary | ICD-10-CM | POA: Diagnosis not present

## 2020-04-19 DIAGNOSIS — M79672 Pain in left foot: Secondary | ICD-10-CM

## 2020-04-19 DIAGNOSIS — Z853 Personal history of malignant neoplasm of breast: Secondary | ICD-10-CM | POA: Diagnosis not present

## 2020-04-19 DIAGNOSIS — M25562 Pain in left knee: Secondary | ICD-10-CM | POA: Diagnosis not present

## 2020-04-19 DIAGNOSIS — R7303 Prediabetes: Secondary | ICD-10-CM | POA: Diagnosis not present

## 2020-04-19 DIAGNOSIS — M79671 Pain in right foot: Secondary | ICD-10-CM

## 2020-04-19 DIAGNOSIS — F5104 Psychophysiologic insomnia: Secondary | ICD-10-CM

## 2020-04-19 DIAGNOSIS — Z791 Long term (current) use of non-steroidal anti-inflammatories (NSAID): Secondary | ICD-10-CM | POA: Diagnosis not present

## 2020-04-19 DIAGNOSIS — Z79899 Other long term (current) drug therapy: Secondary | ICD-10-CM | POA: Insufficient documentation

## 2020-04-19 DIAGNOSIS — R634 Abnormal weight loss: Secondary | ICD-10-CM | POA: Insufficient documentation

## 2020-04-19 DIAGNOSIS — F959 Tic disorder, unspecified: Secondary | ICD-10-CM

## 2020-04-19 DIAGNOSIS — Z8249 Family history of ischemic heart disease and other diseases of the circulatory system: Secondary | ICD-10-CM | POA: Diagnosis not present

## 2020-04-19 DIAGNOSIS — M25541 Pain in joints of right hand: Secondary | ICD-10-CM | POA: Diagnosis not present

## 2020-04-19 NOTE — Progress Notes (Signed)
Patient ID: Kimberly [name], female    DOB: 1955-10-18  MRN: [id number]  CC: Hypertension   Subjective: Kimberly [name] is a 64 y.o. 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  Pt c/o pain in feet and legs.  Bottom of feet sore x few mths.  Worse when she does a lot of walking. She is flat footed. She wears well fitting shoes.  No numbness. Pain in legs last only a few seconds. Some cramps in legs when sitting. She shakes foot to get cramps to go away.   Loss 10 lbs since 08/2019.  Dx with PreDM 12/2019 based on labs and was referred to a nutritionist.  She declined nutritionist.  She cut back a lot on white carbs.  "I am scared to eat this and that. I don't know what is good and what isn't" she would like to eat fruits but she is afraid to because she thinks it would make her become diabetic so she has only been eating apples.  Meats - mostly chicken, beef occasionally.  No palpitations or hair loss.  She feels her weight loss is due to her significant change in dietary habits and fear of eating because of the diagnosis of prediabetes -she walks with her dog every morning for 30 mins.  Walks up 3 hills.  also started going to the [facility]  Joints hurt "like when I had rheumatic fever."  Pain in hands, knees and feet that comes on intermittently and lasts a few minutes.  She has not had any swelling of the joints.  No fever or night sweats.Marland Kitchen   HTN:  Compliant with meds and salt restriction.  She has not checked BP in a while but does have a device at home.  No shortness of breath, chest pain or lower extremity edema  Insomnia: This is still a major issue for her.  Some nights she is only able to sleep for about 3 hours.  She drinks sleepy time tea which helps a little.  On last visit we talked about trying the Melatonin with the Trazodone.  She has not tried it.   She is afraid to.    Tremors worse.  She sometimes has to use her left hand to hold her right hand steady when she tries to feed herself. Patient Active Problem List   Diagnosis Date Noted  . History of rheumatic fever as a child 03/17/2019  . Genetic testing 10/25/2018  . Family history of breast cancer   . Family history of lung cancer   . Ductal carcinoma in situ (DCIS) of left breast 09/07/2018  . Intention tremor 10/15/2017  . Diabetes mellitus screening 06/10/2017  . Breast nodule 03/02/2016  . Seasonal allergies 01/07/2016  . Right shoulder pain 09/28/2015  . Insomnia 07/06/2015  . Tinea pedis of both feet 07/06/2015  . Nocturia 08/25/2014  . Chronic anxiety 08/25/2014  . Heme positive stool 08/16/2014  . HTN (hypertension) 08/16/2014     Current Outpatient Medications on File Prior to Visit  Medication Sig Dispense Refill  . amLODipine (NORVASC) 10 MG tablet TAKE 1 TABLET(10 MG) BY MOUTH DAILY 90 tablet 2  . atenolol (TENORMIN) 25 MG tablet Take 1 tablet (25 mg total) by mouth daily. 90 tablet 3  . Blood Pressure Monitor DEVI Use as directed to check home blood pressure 2-3 times a week 1 Device  0  . busPIRone (BUSPAR) 15 MG tablet Take 1 tablet (15 mg total) by mouth 2 (two) times daily. 180 tablet 3  . ibuprofen (ADVIL,MOTRIN) 200 MG tablet Take 200 mg by mouth every 6 (six) hours as needed.    . Multiple Vitamin (MULTIVITAMIN WITH MINERALS) TABS tablet Take 1 tablet by mouth daily.     . tamoxifen (NOLVADEX) 20 MG tablet Take 1 tablet (20 mg total) by mouth daily. 90 tablet 3  . traZODone (DESYREL) 50 MG tablet TAKE 1 TABLET(50 MG) BY MOUTH AT BEDTIME 30 tablet 5  . triamcinolone cream (KENALOG) 0.1 % Apply 1 application topically 2 (two) times daily. 45 g 0   No current facility-administered medications on file prior to visit.    Allergies  Allergen Reactions  . Codeine Other (See Comments)    Cold sweats    Social History   Socioeconomic History  . Marital  status: Single    Spouse name: Not on file  . Number of children: Not on file  . Years of education: Not on file  . Highest education level: Not on file  Occupational History  . Not on file  Tobacco Use  . Smoking status: Never Smoker  . Smokeless tobacco: Never Used  Vaping Use  . Vaping Use: Never used  Substance and Sexual Activity  . Alcohol use: No    Alcohol/week: 0.0 standard drinks  . Drug use: No  . Sexual activity: Yes    Birth control/protection: Post-menopausal  Other Topics Concern  . Not on file  Social History Narrative  . Not on file   Social Determinants of Health   Financial Resource Strain:   . Difficulty of Paying Living Expenses:   Food Insecurity:   . Worried About Charity fundraiser in the Last Year:   . Arboriculturist in the Last Year:   Transportation Needs:   . Film/video editor (Medical):   Marland Kitchen Lack of Transportation (Non-Medical):   Physical Activity:   . Days of Exercise per Week:   . Minutes of Exercise per Session:   Stress:   . Feeling of Stress :   Social Connections:   . Frequency of Communication with Friends and Family:   . Frequency of Social Gatherings with Friends and Family:   . Attends Religious Services:   . Active Member of Clubs or Organizations:   . Attends Archivist Meetings:   Marland Kitchen Marital Status:   Intimate Partner Violence:   . Fear of Current or Ex-Partner:   . Emotionally Abused:   Marland Kitchen Physically Abused:   . Sexually Abused:     Family History  Problem Relation Age of Onset  . Breast cancer Mother 77  . Cancer Maternal Grandmother        thinks it was braest, but not sure what type of cancer, dx >50  . Heart disease Paternal Grandfather   . Lung cancer Maternal Aunt   . Colon cancer Neg Hx     Past Surgical History:  Procedure Laterality Date  . APPENDECTOMY    . bowel obstruction    . 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 BREAST LUMPECTOMY WITH RADIOACTIVE SEED LOCALIZATION Left  10/14/2018   Procedure: BREAST LUMPECTOMY WITH RADIOACTIVE SEED LOCALIZATION X2;  Surgeon: Erroll Luna, MD;  Location: [facility];  Service: General;  Laterality: Left;  . CHOLECYSTECTOMY    . COLONOSCOPY WITH PROPOFOL N/A 08/05/2018   Procedure: COLONOSCOPY WITH PROPOFOL;  Surgeon: Daneil Dolin, MD;  [facility];  Service: Endoscopy;  Laterality: N/A;  12:45pm  . TONSILLECTOMY      ROS: Review of Systems Negative except as stated above  PHYSICAL EXAM: BP 132/80   Pulse 70   Temp (!) 97 F (36.1 C)   Resp 16   Wt 142 lb 12.8 oz (64.8 kg)   SpO2 97%   BMI 23.76 kg/m   Wt Readings from Last 3 Encounters:  04/19/20 142 lb 12.8 oz (64.8 kg)  09/12/19 152 lb 11.2 oz (69.3 kg)  08/12/19 155 lb 12.8 oz (70.7 kg)    Physical Exam  General appearance - alert, well appearing, elderly African-American female and in no distress Mental status - normal mood, behavior, speech, dress, motor activity, and thought processes Mouth - mucous membranes moist, pharynx normal without lesions Neck - supple, no significant adenopathy Chest - clear to auscultation, no wheezes, rales or rhonchi, symmetric air entry Heart - normal rate, regular rhythm, normal S1, S2, no murmurs, rubs, clicks or gallops Musculoskeletal -she has no signs of swelling or significant deformity in the hands, wrists, knees or ankles.  She has good range of motion in these joints.  No point tenderness.   Extremities - peripheral pulses normal this includes dorsalis pedis, posterior tibialis and popliteal, no pedal edema, no clubbing or cyanosis Neuro: She has intention tremors in both hands right worse than the left.  She also has tremor of the head.  The head tremor seems to be worse  CMP Latest Ref Rng & Units 12/20/2019 10/08/2018 09/15/2018  Glucose 65 - 99 mg/dL 202(H) 104(H) 104(H)  BUN 8 - 27 mg/dL 11 15 15   Creatinine 0.57 - 1.00 mg/dL 0.95 0.95 0.92  Sodium 134 - 144 mmol/L 139 139 141  Potassium 3.5 - 5.2  mmol/L 4.6 4.6 4.8  Chloride 96 - 106 mmol/L 102 107 109  CO2 20 - 29 mmol/L 21 25 23   Calcium 8.7 - 10.3 mg/dL 9.9 9.9 9.8  Total Protein 6.0 - 8.5 g/dL 7.1 7.8 7.7  Total Bilirubin 0.0 - 1.2 mg/dL 0.2 0.4 0.4  Alkaline Phos 39 - 117 IU/L 63 74 87  AST 0 - 40 IU/L 14 19 16   ALT 0 - 32 IU/L 9 15 11    Lipid Panel     Component Value Date/Time   CHOL 153 12/20/2019 1029   TRIG 98 12/20/2019 1029   HDL 59 12/20/2019 1029   CHOLHDL 2.6 12/20/2019 1029   CHOLHDL 2.7 11/13/2016 1102   VLDL 25 11/13/2016 1102   LDLCALC 76 12/20/2019 1029    CBC    Component Value Date/Time   WBC 5.4 12/20/2019 1029   WBC 6.1 10/08/2018 0919   RBC 4.49 12/20/2019 1029   RBC 4.84 10/08/2018 0919   HGB 14.1 12/20/2019 1029   HCT 41.2 12/20/2019 1029   PLT 404 12/20/2019 1029   MCV 92 12/20/2019 1029   MCH 31.4 12/20/2019 1029   MCH 30.4 10/08/2018 0919   MCHC 34.2 12/20/2019 1029   MCHC 32.7 10/08/2018 0919   RDW 12.3 12/20/2019 1029   LYMPHSABS 1.9 10/08/2018 0919   MONOABS 0.6 10/08/2018 0919   EOSABS 0.2 10/08/2018 0919   BASOSABS 0.1 10/08/2018 0919   Lab Results  Component Value Date   HGBA1C 5.8 (H) 12/20/2019     ASSESSMENT AND PLAN: 1. Essential hypertension Close to goal.  To new amlodipine and atenolol.  Advised to check blood pressure at least  twice a week with goal being 130/80 or lower.  2. Chronic insomnia Advised patient that it is okay to try the 3 mg melatonin.  If she is still not able to sleep then we will have no other choice and to try her with a low-dose of Ambien  3. Unintended weight loss Seem to be related to her fear of eating because of diagnosis of prediabetes.  Dietary counseling given.  She now is agreeable to seeing a nutritionist.  If weight loss persists, she will follow-up.  She is up-to-date with age-appropriate cancer screenings. -We will check TSH and other baseline blood tests including CBC and chemistry  4. Intention tremor 5. Tic disorder I  will get her back to see the neurologist whom she has not seen in a while.  The head tremors/tics seem to be worse - TSH - Ambulatory referral to Neurology - CBC - Basic Metabolic Panel   6. Pain in both feet - Ambulatory referral to Podiatry  7. Muscle cramps Patient prefers home remedies.  I told her that she can try eating a little mustard when she gets the cramps.  Cramps are not consistent with claudication as they occur mainly at rest.  She does have good pulses in the legs and feet - Basic Metabolic Panel  8. Prediabetes With the weight loss, I suspect she is no longer prediabetic. - Amb ref to Medical Nutrition Therapy-MNT - Hemoglobin A1c    Patient was given the opportunity to ask questions.  Patient verbalized understanding of the plan and was able to repeat key elements of the plan.   Orders Placed This Encounter  Procedures  . TSH  . Hemoglobin A1c  . CBC  . Basic Metabolic Panel  . Amb ref to Medical Nutrition Therapy-MNT  . Ambulatory referral to Podiatry  . Ambulatory referral to Neurology     Requested Prescriptions    No prescriptions requested or ordered in this encounter    Return in about 3 months (around 07/20/2020).  Karle Plumber, MD, FACP

## 2020-04-19 NOTE — Patient Instructions (Signed)
I have referred you to the podiatrist and the nutritionist.  It is okay for you to try the melatonin 3 mg at bedtime.  I have submitted a referral for you to follow-up with the neurologist for the tremors.  Okay to use Tylenol as needed when you get muscle or joint pains.

## 2020-04-20 LAB — BASIC METABOLIC PANEL
BUN/Creatinine Ratio: 17 (ref 12–28)
BUN: 17 mg/dL (ref 8–27)
CO2: 26 mmol/L (ref 20–29)
Calcium: 10 mg/dL (ref 8.7–10.3)
Chloride: 105 mmol/L (ref 96–106)
Creatinine, Ser: 1 mg/dL (ref 0.57–1.00)
GFR calc Af Amer: 68 mL/min/{1.73_m2} (ref >59)
GFR calc non Af Amer: 59 mL/min/{1.73_m2} — ABNORMAL LOW (ref >59)
Glucose: 97 mg/dL (ref 65–99)
Potassium: 4.4 mmol/L (ref 3.5–5.2)
Sodium: 141 mmol/L (ref 134–144)

## 2020-04-20 LAB — HEMOGLOBIN A1C
Est. average glucose Bld gHb Est-mCnc: 114 mg/dL
Hgb A1c MFr Bld: 5.6 % (ref 4.8–5.6)

## 2020-04-20 LAB — CBC
Hematocrit: 40.2 % (ref 34.0–46.6)
Hemoglobin: 13.7 g/dL (ref 11.1–15.9)
MCH: 31.2 pg (ref 26.6–33.0)
MCHC: 34.1 g/dL (ref 31.5–35.7)
MCV: 92 fL (ref 79–97)
Platelets: 396 10*3/uL (ref 150–450)
RBC: 4.39 x10E6/uL (ref 3.77–5.28)
RDW: 12.2 % (ref 11.7–15.4)
WBC: 6.4 10*3/uL (ref 3.4–10.8)

## 2020-04-20 LAB — TSH: TSH: 1.91 u[IU]/mL (ref 0.450–4.500)

## 2020-05-02 ENCOUNTER — Ambulatory Visit: Payer: Medicare Other | Admitting: Podiatry

## 2020-05-14 ENCOUNTER — Ambulatory Visit (INDEPENDENT_AMBULATORY_CARE_PROVIDER_SITE_OTHER): Payer: Medicare Other | Admitting: Podiatry

## 2020-05-14 ENCOUNTER — Other Ambulatory Visit: Payer: Self-pay

## 2020-05-14 ENCOUNTER — Encounter: Payer: Self-pay | Admitting: Podiatry

## 2020-05-14 DIAGNOSIS — M2142 Flat foot [pes planus] (acquired), left foot: Secondary | ICD-10-CM | POA: Diagnosis not present

## 2020-05-14 DIAGNOSIS — M79671 Pain in right foot: Secondary | ICD-10-CM | POA: Diagnosis not present

## 2020-05-14 DIAGNOSIS — M79672 Pain in left foot: Secondary | ICD-10-CM | POA: Diagnosis not present

## 2020-05-14 DIAGNOSIS — M2141 Flat foot [pes planus] (acquired), right foot: Secondary | ICD-10-CM | POA: Diagnosis not present

## 2020-05-14 MED ORDER — MELOXICAM 15 MG PO TABS: 15.0000 mg | ORAL_TABLET | Freq: Every day | ORAL | 1 refills | Status: DC

## 2020-05-14 NOTE — Progress Notes (Signed)
   HPI: 64 y.o. female presenting today as a new patient for evaluation of bilateral foot pain.  Patient states that she gets chronic bilateral foot pain this been going on for the past year.  The pain is on the bottoms of the feet.  She has not done anything for treatment.  She denies having any history of injury.  She presents for further treatment evaluation  Past Medical History:  Diagnosis Date  . Anxiety 1995  . Cancer [facility])    breast left  . Depression 1995  . Family history of breast cancer   . Family history of lung cancer   . History of kidney stones   . History of rheumatic fever as a child   . Hypertension Dx Dec 2015  . Panic attack 1995  . Tremor      Physical Exam: General: The patient is alert and oriented x3 in no acute distress.  Dermatology: Skin is warm, dry and supple bilateral lower extremities. Negative for open lesions or macerations.  Vascular: Palpable pedal pulses bilaterally. No edema or erythema noted. Capillary refill within normal limits.  Neurological: Epicritic and protective threshold grossly intact bilaterally.   Musculoskeletal Exam: Range of motion within normal limits to all pedal and ankle joints bilateral. Muscle strength 5/5 in all groups bilateral.  There is some diffuse pain on palpation along the weightbearing surfaces of the feet bilateral.  With the foot in a loaded position there is some medial longitudinal arch collapse consistent with pes planus  Assessment: 1.  Pes planus bilateral 2.  Generalized diffuse foot pain bilateral   Plan of Care:  1. Patient evaluated.  2.  Prescription for meloxicam 15 mg daily as needed 3.  Recommend OTC power step arch supports available on [employer] 4.  Recommend good supportive shoes 5.  Return to clinic as needed      Edrick Kins, DPM [facility]  Dr. Edrick Kins, DPM    [address], [place] [address]                  Office [phone]  Fax [phone]

## 2020-05-14 NOTE — Patient Instructions (Signed)
Recommend 'Powerstep Arch Supports' available on [employer]

## 2020-05-30 ENCOUNTER — Ambulatory Visit (INDEPENDENT_AMBULATORY_CARE_PROVIDER_SITE_OTHER): Payer: Medicare Other | Admitting: Neurology

## 2020-05-30 ENCOUNTER — Encounter: Payer: Self-pay | Admitting: Neurology

## 2020-05-30 VITALS — BP 124/78 | HR 63 | Ht 64.0 in | Wt 145.0 lb

## 2020-05-30 DIAGNOSIS — G250 Essential tremor: Secondary | ICD-10-CM | POA: Diagnosis not present

## 2020-05-30 MED ORDER — PRIMIDONE 50 MG PO TABS: ORAL_TABLET | ORAL | 5 refills | Status: DC

## 2020-05-30 NOTE — Progress Notes (Signed)
Subjective:    Patient ID: Kimberly [name] is a 64 y.o. female.  HPI     Interim history:   Dear Dr. 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  The patient is unaccompanied today. As you know, [name] is a 64 year old right-handed woman with an underlying medical history of hypertension, rheumatic fever as a child, kidney stones, breast cancer, depression, and anxiety, who reports worsening tremors affecting her hands, particularly her right hand and also her neck and head area.  She has not had any recent falls.  She does have some issues with her balance however.  She tries to hydrate well with water but sometimes forgets to drink water.  She does not drink any caffeine or alcohol on a regular basis.  I have evaluated her a little over 2 years ago for hand tremors, at which time her tremors were mild.  She had a nonfocal neurologic exam.  I suggested we proceed with a brain MRI to rule out a structural cause.  She had a brain MRI with and without contrast on 03/15/2018 and I reviewed the results: IMPRESSION: This MRI of the brain with and without contrast shows the following: 1.   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  It is next to the head of the right caudate but does not appear to involve the basal ganglia. 2.   Scattered foci in the subcortical and deep white matter of the hemispheres consistent with chronic microvascular ischemic changes.  None of the foci appear to be acute. 3.   There is asymmetry of the pituitary gland.  The gland appears to enhance homogenously.  The pituitary stalk is midline.  This could be an incidental finding or due to a microadenoma.  If clinically indicated, consider dedicated pituitary imaging. 4.   Chronic ethmoid and right frontal sinusitis. 5.   There are no acute findings.   We called her with her  test results and she was advised to proceed with a dedicated pituitary gland MRI.  She was agreeable to this.  She had a repeat brain MRI with special attention to the pituitary gland on 03/31/2018 with and without contrast and I reviewed the results:  IMPRESSION:    MRI brain and pituitary (with and without) demonstrating: 1.  Mild scattered periventricular subcortical foci of nonspecific gliosis. 2.  Dedicated pituitary gland imaging appears normal enhancing pituitary gland tissue.  No evidence of underlying pituitary mass. 3.  Incidental right frontal developmental venous anomaly.   She reports no other new symptoms, reports no pain no weakness, no facial symptoms otherwise.  She does have some voice tremor.  She reports difficulty with sleep, she takes trazodone for sleep but is often still awake at night and tosses and turns.  She is not sure that the tremor is affecting her sleep.  Previously:   02/23/18: 64 year old right-handed woman with an underlying medical history of anxiety, hypertension, seasonal allergies, insomnia, and overweight state, who reports a bilateral hand tremor for the past 1-2 years. I reviewed your office note from 12/03/2017. She has been on atenolol, 25 mg once daily, she has not noticed much in the way of tremor benefit from it. She admits that she does not drink enough water, sometimes only 1 or 2 cups per day, does not drink soda daily, and does not like caffeine in the  form of tea or coffee. Sometimes she drinks some juice.  She is single and lives with her daughter, she has 1 grown daughter. She is a nonsmoker and does not utilize alcohol. She had routine blood work last year in February which I reviewed, TSH was normal at the time. She has a follow-up appointment next week. She is not familiar with her father side of her family history. She has 1 brother and 1 sister, neither one with tremors. Her mother did not have a tremor, she died at 4 from cancer. 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  Her Past Medical History Is Significant For: Past Medical History:  Diagnosis Date  . Anxiety 1995  . Cancer [facility])    breast left  . Depression 1995  . Family history of breast cancer   . Family history of lung cancer   . History of kidney stones   . History of rheumatic fever as a child   . Hypertension Dx Dec 2015  . Panic attack 1995  . Tremor     Her Past Surgical History Is Significant For: Past Surgical History:  Procedure Laterality Date  . APPENDECTOMY    . bowel obstruction    . 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 BREAST LUMPECTOMY WITH RADIOACTIVE SEED LOCALIZATION Left 10/14/2018   Procedure: BREAST LUMPECTOMY WITH RADIOACTIVE SEED LOCALIZATION X2;  Surgeon: Erroll Luna, MD;  Location: [facility];  Service: General;  Laterality: Left;  . CHOLECYSTECTOMY    . COLONOSCOPY WITH PROPOFOL N/A 08/05/2018   Procedure: COLONOSCOPY WITH PROPOFOL;  Surgeon: Daneil Dolin, MD;  [facility];  Service: Endoscopy;  Laterality: N/A;  12:45pm  . TONSILLECTOMY      Her Family History Is Significant For: Family History  Problem Relation Age of Onset  . Breast cancer Mother 20  . Cancer Maternal Grandmother        thinks it was braest, but not sure what type of cancer, dx >50  . Heart disease Paternal Grandfather   . Lung cancer Maternal Aunt   . Colon cancer Neg Hx     Her Social History Is Significant For: Social History   Socioeconomic History  . Marital status: Single    Spouse name: Not on file  . Number of children: Not on file  . Years of education: Not on file  . Highest education level: Not on file  Occupational History  . Not on file  Tobacco Use  . Smoking status: Never Smoker  . Smokeless tobacco: Never Used  Vaping Use  . Vaping Use: Never used  Substance and Sexual Activity  . Alcohol use: No    Alcohol/week: 0.0 standard drinks  . Drug use: No  . Sexual activity: Yes    Birth control/protection:  Post-menopausal  Other Topics Concern  . Not on file  Social History Narrative  . Not on file   Social Determinants of Health   Financial Resource Strain:   . Difficulty of Paying Living Expenses:   Food Insecurity:   . Worried About Charity fundraiser in the Last Year:   . Arboriculturist in the Last Year:   Transportation Needs:   . Film/video editor (Medical):   Marland Kitchen Lack of Transportation (Non-Medical):   Physical Activity:   . Days of Exercise per Week:   . Minutes of Exercise per Session:   Stress:   . Feeling of Stress :   Social Connections:   .  Frequency of Communication with Friends and Family:   . Frequency of Social Gatherings with Friends and Family:   . Attends Religious Services:   . Active Member of Clubs or Organizations:   . Attends Archivist Meetings:   Marland Kitchen Marital Status:     Her Allergies Are:  Allergies  Allergen Reactions  . Codeine Other (See Comments)    Cold sweats  :   Her Current Medications Are:  Outpatient Encounter Medications as of 05/30/2020  Medication Sig  . amLODipine (NORVASC) 10 MG tablet TAKE 1 TABLET(10 MG) BY MOUTH DAILY  . atenolol (TENORMIN) 25 MG tablet Take 1 tablet (25 mg total) by mouth daily.  . Blood Pressure Monitor DEVI Use as directed to check home blood pressure 2-3 times a week  . busPIRone (BUSPAR) 15 MG tablet Take 1 tablet (15 mg total) by mouth 2 (two) times daily.  Marland Kitchen ibuprofen (ADVIL,MOTRIN) 200 MG tablet Take 200 mg by mouth every 6 (six) hours as needed.  . meloxicam (MOBIC) 15 MG tablet Take 1 tablet (15 mg total) by mouth daily.  . Multiple Vitamin (MULTIVITAMIN WITH MINERALS) TABS tablet Take 1 tablet by mouth daily.   . tamoxifen (NOLVADEX) 20 MG tablet Take 1 tablet (20 mg total) by mouth daily.  . traZODone (DESYREL) 50 MG tablet TAKE 1 TABLET(50 MG) BY MOUTH AT BEDTIME  . triamcinolone cream (KENALOG) 0.1 % Apply 1 application topically 2 (two) times daily.   No facility-administered  encounter medications on file as of 05/30/2020.  :  Review of Systems:  Out of a complete 14 point review of systems, all are reviewed and negative with the exception of these symptoms as listed below:  Review of Systems  Neurological:       Pt here to discuss worsening tremors. Pt reports tremors are located in her head, hands and legs.  Pt reports her tremors are keeping her up at night. Pt reports right hand tremors are worse than the left.    Objective:  Neurological Exam  Physical Exam Physical Examination:   Vitals:   05/30/20 0910  BP: 124/78  Pulse: 63  SpO2: 100%    General Examination: The patient is a very pleasant 64 y.o. female in no acute distress. She appears well-developed and well-nourished and well groomed.   HEENT: Normocephalic, atraumatic, pupils are equal, round and reactive to light and accommodation. Corrective eyeglasses in place. Hearing is grossly intact. Extraocular tracking is fairly good. Face is symmetric, no facial masking is noted, she does have a mild voice tremor.  She also has an intermittent stutter/stammer.  She has a mild to moderate head tremor, forward and yes-yes type typically.  Airway examination reveals moderate mouth dryness, adequate dental hygiene with several missing teeth, tongue protrudes centrally in palate elevates symmetrically.  Neck is supple with full range of motion.    Chest: Clear to auscultation without wheezing, rhonchi or crackles noted.  Heart: S1+S2+0, regular and normal without murmurs, rubs or gallops noted.   Abdomen: Soft, non-tender and non-distended with normal bowel sounds appreciated on auscultation.  Extremities: There is no pitting edema in the distal lower extremities bilaterally. Pedal pulses are intact.  Skin: Warm and dry without trophic changes noted.  Musculoskeletal: exam reveals no obvious joint deformities, tenderness or joint swelling or erythema.   Neurologically:  Mental status: The  patient is awake, alert and oriented in all 4 spheres. Her immediate and remote memory, attention, language skills and fund of knowledge are  appropriate. There is no evidence of aphasia, agnosia, apraxia or anomia. Speech is as described above.  Thought process is linear. Mood is normal and affect is normal.  Cranial nerves II - XII are as described above under HEENT exam. In addition: shoulder shrug is normal with equal shoulder height noted. Motor exam: Normal bulk, strength and tone is noted. There is no drift, resting tremor or rebound.   (On 02/23/2018: on Archimedes spiral drawing she has mild trembling with both left and right hand. Handwriting is minimally tremulous, legible, not micrographic. She has a very minute postural tremor, no action tremor, no resting or intention tremor.)  On 05/30/2020: On Archimedes spiral drawing she has mild trembling and insecurity with the left hand, moderate trembling was noted with the right hand, handwriting is larger and tremulous, legible. She has a slight postural tremor in the right upper extremity, nothing obvious in the left upper extremity, with finger-to-nose testing she has a slow and deliberate movement, particularly on the right side but no obvious intention tremor, slight action tremor noted in the right hand, not so much in the left hand.  She has no lower extremity tremors.  Romberg is negative with the exception of slight sway.  Reflexes are 1+ throughout and toes are downgoing bilaterally.  Fine motor skills are intact with finger taps and foot taps, she is slightly slow and deliberate with her foot taps bilaterally but no decrement in amplitude is noted.  Cerebellar testing: No dysmetria or intention tremor on finger to nose testing. Heel to shin is unremarkable bilaterally, with the exception of mildly slow movements. There is no truncal or gait ataxia.  Sensory exam: intact to light touch in the upper and lower extremities.  Gait, station and  balance: She stands easily. No veering to one side is noted. No leaning to one side is noted. Posture is age-appropriate and mildly stooped in the upper back. Tandem walk is challenging for her.          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  She has had a tremor affecting her hands for the past 2 to 3 years approximately.  She has also had a head tremor which has become worse.  She has noticed progression of her tremors over time.  She does have mild progression in her examination for me as well.  We talked about symptomatic treatment options.  She has already been on a beta-blocker and low-dose, atenolol 25 mg strength.  She is encouraged to talk to you about potentially increasing this if possible.  She has a lower pulse rate in the lower 60s today.  In addition, we could potentially have success with low-dose Mysoline.  My only concern is that there may be a drug drug interaction with tamoxifen.  If her oncologist is okay with her starting low-dose Mysoline at half a pill at night, 50 mg strength, we can certainly try this.  It is considered generally speaking first-line alongside a trial of a beta-blocker.  If she cannot be safely on Mysoline we may have to forego it altogether.  If she can stay on a low dose such as 25 mg, it may be worth trying, I suggested she start off with half a pill for the first 2 weeks and increase it  to 1 pill daily if possible.  I will also copy her oncologist on my note today.  She is advised to follow-up routinely in this office in about 3 months to see one of our nurse practitioners.   She has undergone a brain MRI in 2019.  We also did a dedicated pituitary MRI which turned out to be benign.  We talked about Mysoline, expectations and  benefits and potential side effects of this medication.  In particular, she is advised to be cautious about sedating properties of this medication and impaired balance.  She does report difficulty with her sleep at night.  She is on low-dose trazodone.  She is advised that she could have more potentiated sleepiness if she also adds Mysoline at night.  She is in fact hoping that she may be able to sleep a little better.  She is advised to be mindful that if she were to have to get up at night to go to the bathroom that she may have more grogginess or difficulty with her balance if she starts the Mysoline. I answered all her questions today and the patient was in agreement with the plan.  Thank you very much for allowing me to participate in the care of this nice patient. If I can be of any further assistance to you please do not hesitate to call me at [phone].  Sincerely,   Star Age, MD, PhD

## 2020-05-30 NOTE — Patient Instructions (Addendum)
I agree with you that your tremor has become worse.  Unfortunately, there is no cure for tremors.  We can try you on a medication called Mysoline (primidone) 50 mg strength: Take 1/2 pill each bedtime for 2 weeks, then 1 pill each bedtime thereafter. Common side effects reported are: Sleepiness, drowsiness, balance problems, confusion, and GI related symptoms.  Please also note that the Mysoline may interfere with the drug level of your tamoxifen.  I am not sure that a low dose would interfere with the tamoxifen but I would like to make sure that your oncologist is okay with this.  Prior to starting the Mysoline, please let me know if the Mysoline is okay from the oncologist standpoint as you are also on tamoxifen.  I will also copy your oncologist on my note today.   If your oncologist, Dr. Lindi Adie has concerns about you taking the Mysoline, we may have to stop it or lower it or forego it altogether, unfortunately.  Please let me know if you have any additional questions, follow-up in 3 months to see one of our nurse practitioners. As an alternative, please talk to your primary care physician about the possibility of increasing your atenolol, which is a beta-blocker and often is utilized for tremor control.

## 2020-05-31 ENCOUNTER — Telehealth: Payer: Self-pay | Admitting: *Deleted

## 2020-05-31 NOTE — Telephone Encounter (Signed)
Received call from pt stating PCP has prescribed her Primidone 50 mg.  Pt instructed by pcp to take 1/2 tabled (25 mg total) daily.  Pt requesting advice from MD regarding interaction between Primidone 25 mg daily and Tamoxifen.  Per Dr. Lindi Adie, okay for pt to continue primidone at 25 mg daily.  If pt needs to increase dosage of Primidone, pt to call the office and alert MD.  Pt verbalized understanding.

## 2020-06-05 ENCOUNTER — Encounter: Payer: Self-pay | Admitting: Dietician

## 2020-06-05 ENCOUNTER — Encounter: Payer: Medicare Other | Attending: Internal Medicine | Admitting: Dietician

## 2020-06-05 ENCOUNTER — Other Ambulatory Visit: Payer: Self-pay

## 2020-06-05 DIAGNOSIS — R7303 Prediabetes: Secondary | ICD-10-CM | POA: Insufficient documentation

## 2020-06-05 DIAGNOSIS — Z713 Dietary counseling and surveillance: Secondary | ICD-10-CM | POA: Insufficient documentation

## 2020-06-05 NOTE — Progress Notes (Signed)
Medical Nutrition Therapy   Primary concerns today: prediabetes  Referral diagnosis: R73.03- prediabetes Preferred learning style: no preference indicated Learning readiness: ready   NUTRITION ASSESSMENT   Clinical Medical Hx: prediabetes, HTN, anxiety, depression, cancer, kidney stones Medications: see list Labs: A1c: 5.6% (04/19/20)  Lifestyle & Dietary Hx Typical meal pattern is 2 meals per day plus snacks. Does not eat breakfast, first meal is lunch around noon which may just be a snack such as apple with peanut butter. States she is scared to eat foods, especially carbohydrates, because she has hx of prediabetes (A1c of 5.8% in March 2021). States she used to eat sweets and enjoys them but will now do snacks such as keto cookies or keto cheesecake. Tries to avoid grains/carbs but may have a slice or bread or brown rice or sweet potatoes. Asked about appropriate amount of carbs to eat at a time, good low sugar beverages, and if certain foods are okay to eat.  Per chart, recent weight loss of about 10 lbs between 08/2019 and 04/2020. Patient states her weight loss is related to being scared to eat foods and cutting out most carbs/sugars.   Supplements: MVI  Stress: worries and is often stressed  Sleep: takes medication which doesn't always help her sleep, thinks it is nervous/stress related Current average weekly physical activity: walks dog, walks up and down stairs   24-Hr Dietary Recall First Meal: - Snack: - Second Meal: salad (or apple + peanut butter)  Snack: fruit (or nuts)  Third Meal: meat + vegetable  Snack: -  Beverages: water   NUTRITION DIAGNOSIS  Predicted inadequate nutrient intake (NI-5.11.1) related to lack of prediabetes nutrition knowledge as evidenced by patient reported decreased food intake.   NUTRITION INTERVENTION  Nutrition education (E-1) on the following topics:  . Prediabetes nutrition and blood sugar management: balance of carbohydrate intake and  best sources, importance of fiber and protein, stress management, etc.   Handouts Provided Include   MyPlate & Meal Ideas  Balanced Snacks   Learning Style & Readiness for Change Teaching method utilized: Visual & Auditory  Demonstrated degree of understanding via: Teach Back  Barriers to learning/adherence to lifestyle change: None Identified   Goals Established by Pt . Include a source of carbohydrates and protein with snacks . Use the Meal Ideas sheet to build grocery list  . Stay at 45 grams of carbohydrates or less per meal   MONITORING & EVALUATION Dietary intake, weekly physical activity, and goals in 6 weeks.  Next Steps  Patient is to return to [facility] for follow up visit in about 6 weeks. Patient may contact via phone or email in the meantime with questions/concerns.

## 2020-06-05 NOTE — Patient Instructions (Signed)
Include a source of carbohydrates and protein with snacks for balance.   Use the Meal Ideas sheet to help write out your grocery list.   Remember to stick with about 45 grams of carbohydrates or less per meal.

## 2020-07-05 ENCOUNTER — Other Ambulatory Visit: Payer: Self-pay | Admitting: Internal Medicine

## 2020-07-05 DIAGNOSIS — F99 Mental disorder, not otherwise specified: Secondary | ICD-10-CM

## 2020-07-05 NOTE — Telephone Encounter (Signed)
Requested Prescriptions  Pending Prescriptions Disp Refills   traZODone (DESYREL) 50 MG tablet [Pharmacy Med Name: TRAZODONE 50MG  TABLETS] 30 tablet 0    Sig: TAKE 1 TABLET(50 MG) BY MOUTH AT BEDTIME     Psychiatry: Antidepressants - Serotonin Modulator Passed - 07/05/2020  9:19 AM      Passed - Valid encounter within last 6 months    Recent Outpatient Visits          2 months ago Essential hypertension   [place], MD   6 months ago Essential hypertension   [place], MD   10 months ago Rash and nonspecific skin eruption   [place], Deborah B, MD   1 year ago Essential hypertension   [place], Deborah B, MD   1 year ago Essential hypertension   [place], MD      Future Appointments            In 2 weeks Ladell Pier, MD [place]

## 2020-07-07 ENCOUNTER — Other Ambulatory Visit: Payer: Self-pay | Admitting: Podiatry

## 2020-07-18 ENCOUNTER — Encounter: Payer: Self-pay | Admitting: Dietician

## 2020-07-18 ENCOUNTER — Encounter: Payer: Medicare Other | Attending: Internal Medicine | Admitting: Dietician

## 2020-07-18 ENCOUNTER — Other Ambulatory Visit: Payer: Self-pay

## 2020-07-18 DIAGNOSIS — R7303 Prediabetes: Secondary | ICD-10-CM | POA: Insufficient documentation

## 2020-07-18 DIAGNOSIS — Z713 Dietary counseling and surveillance: Secondary | ICD-10-CM | POA: Diagnosis not present

## 2020-07-18 NOTE — Patient Instructions (Signed)
   Try some new breakfast ideas, such as including grits, adding nuts to your oatmeal, or making a breakfast burrito.   Try taking your melatonin before bed and see if it helps you fall asleep and with your nerves.   Consider trying Protein2O protein drink for a low sugar/ high protein drink option.

## 2020-07-18 NOTE — Progress Notes (Signed)
Medical Nutrition Therapy  Follow-Up Visit Appt Start Time: 11:15am    End Time: 11:45am  Patient was originally seen on 06/05/2020 for prediabetes.  Primary concerns today: continued healthy eating for prediabetes management    NUTRITION ASSESSMENT   Clinical Medical Hx: prediabetes, HTN, anxiety, depression, cancer, kidney stones Medications: see list Labs: A1c: 5.6% (04/19/20)  Lifestyle & Dietary Hx Patient states she is getting better at including breakfast and eating a little more at a time. States she sometimes has a hard time "filling up" at meals since she avoids most carbohydrate (and therefore some fiber-containing) foods. States she really likes apples with peanut butter and enjoys fruit and nuts for snacks. Often eats dinner with her daughter and grandchildren, states her granddaughter is vegan and often eats lots of vegetables so she will also have these for her dinner along with some meal/protein. States she still experiences lots of stress/nervous problems, and would like to speak with her neurologist about this. States she was prescribed melatonin but has not tried it yet, so we discussed how this may help with sleeping.   Estimated daily fluid intake: 64 oz Supplements: MVI  Sleep: still not sleeping well, stress related, going to try melatonin  Stress / self-care: worries and is often stressed  Current average weekly physical activity: walks the dog   24-Hr Dietary Recall First Meal: 2 eggs + toast + sausage  Snack: - Second Meal: apple + peanut butter + oatmeal (or grilled chicken salad) Snack: nuts Third Meal: baked meat + vegetables (or stir fry)  Snack: fruit + yogurt  Beverages: water, sugar free aloe vera water   NUTRITION DIAGNOSIS  06/05/2020: Predicted inadequate nutrient intake (NI-5.11.1) related to lack of prediabetes nutrition knowledge as evidenced by patient reported decreased food intake. Status: Continue   NUTRITION INTERVENTION  Nutrition  Education   Handouts Provided Include   Breakfast Ideas  Learning Style & Readiness for Change Teaching method utilized: Visual & Auditory  Demonstrated degree of understanding via: Teach Back  Barriers to learning/adherence to lifestyle change: None Identified   Progress/ Updates on Pt's Goals . Including sources of carbohydrates and protein with meals . Staying within appropriate amounts of carbohydrates at meals  . NEW: Try taking melatonin to help fall asleep at night  . NEW: Try new breakfast ideas  . NEW: Try Protein2O or other sugar-free drink for something with flavor    MONITORING & EVALUATION Dietary intake, weekly physical activity, and goals in 6 weeks.  Next Steps  Patient is to follow up at [facility] in 6 weeks.

## 2020-07-20 ENCOUNTER — Ambulatory Visit: Payer: Medicare Other | Attending: Internal Medicine | Admitting: Internal Medicine

## 2020-07-20 ENCOUNTER — Other Ambulatory Visit: Payer: Self-pay

## 2020-07-20 DIAGNOSIS — F99 Mental disorder, not otherwise specified: Secondary | ICD-10-CM

## 2020-07-20 DIAGNOSIS — I10 Essential (primary) hypertension: Secondary | ICD-10-CM | POA: Diagnosis not present

## 2020-07-20 DIAGNOSIS — F5105 Insomnia due to other mental disorder: Secondary | ICD-10-CM

## 2020-07-20 DIAGNOSIS — G252 Other specified forms of tremor: Secondary | ICD-10-CM | POA: Diagnosis not present

## 2020-07-20 DIAGNOSIS — F419 Anxiety disorder, unspecified: Secondary | ICD-10-CM

## 2020-07-20 MED ORDER — BUSPIRONE HCL 15 MG PO TABS: 15.0000 mg | ORAL_TABLET | Freq: Two times a day (BID) | ORAL | 3 refills | Status: DC

## 2020-07-20 MED ORDER — TRAZODONE HCL 50 MG PO TABS: ORAL_TABLET | ORAL | 4 refills | Status: DC

## 2020-07-20 NOTE — Progress Notes (Signed)
Virtual Visit via Telephone Note Due to current restrictions/limitations of in-office visits due to the COVID-19 pandemic, this scheduled clinical appointment was converted to a telehealth visit  I connected with Kimberly [name] on 07/20/20 at 12:29 p.m by telephone and verified that I am speaking with the correct person using two identifiers. I am in my office.  The patient is at home.  Only the patient, my CMA Sallyanne Havers and myself participated in this encounter.  I discussed the limitations, risks, security and privacy concerns of performing an evaluation and management service by telephone and the availability of in person appointments. I also discussed with the patient that there may be a patient responsible charge related to this service. The patient expressed understanding and agreed to proceed.   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  Last seen 04/2020.  Today is routine f/u.  Saw RD since last visit and found it helpful.  Less fearful about eating now.  No scale at home but feels wgh about same  Tremors:  Saw Dr. Rexene Alberts since last visit with me.  Started on Mysoline 50 mg 1/2 pill.  Feels it has helped a little.  Has f/u appt schedule   Prescribed Mobic by her podiatrist for the foot pain.  Also has new inserts for her shoes.  She finds both helpful  HTN: she has not checked BP recently Compliant with meds.  No chest pains or shortness of breath.  She limits salt in the foods.  She requests refill on trazodone and BuSpar.  HM:  Due for Prevnar 13.  Prefers to wait until next visit Outpatient Encounter Medications as of 07/20/2020  Medication Sig  . amLODipine (NORVASC) 10 MG tablet TAKE 1 TABLET(10 MG) BY MOUTH DAILY  . atenolol (TENORMIN) 25 MG tablet Take 1 tablet (25 mg total) by mouth daily.  . Blood Pressure Monitor DEVI Use as directed to check home blood pressure 2-3 times a week  .  busPIRone (BUSPAR) 15 MG tablet Take 1 tablet (15 mg total) by mouth 2 (two) times daily.  Marland Kitchen ibuprofen (ADVIL,MOTRIN) 200 MG tablet Take 200 mg by mouth every 6 (six) hours as needed.  . meloxicam (MOBIC) 15 MG tablet TAKE 1 TABLET(15 MG) BY MOUTH DAILY  . Multiple Vitamin (MULTIVITAMIN WITH MINERALS) TABS tablet Take 1 tablet by mouth daily.   . primidone (MYSOLINE) 50 MG tablet 1/2 pill each bedtime x 2 weeks, then 1 pill nightly thereafter.  . tamoxifen (NOLVADEX) 20 MG tablet Take 1 tablet (20 mg total) by mouth daily.  . traZODone (DESYREL) 50 MG tablet TAKE 1 TABLET(50 MG) BY MOUTH AT BEDTIME  . triamcinolone cream (KENALOG) 0.1 % Apply 1 application topically 2 (two) times daily.   No facility-administered encounter medications on file as of 07/20/2020.    Observations/Objective: Results for orders placed or performed in visit on 04/19/20  TSH  Result Value Ref Range   TSH 1.910 0.450 - 4.500 uIU/mL  Hemoglobin A1c  Result Value Ref Range   Hgb A1c MFr Bld 5.6 4.8 - 5.6 %   Est. average glucose Bld gHb Est-mCnc 114 mg/dL  CBC  Result Value Ref Range   WBC 6.4 3.4 - 10.8 x10E3/uL   RBC 4.39 3.77 - 5.28 x10E6/uL   Hemoglobin 13.7 11.1 - 15.9 g/dL   Hematocrit 40.2 34.0 - 46.6 %   MCV 92 79 - 97 fL   MCH 31.2  26.6 - 33.0 pg   MCHC 34.1 31 - 35 g/dL   RDW 12.2 11.7 - 15.4 %   Platelets 396 150 - 450 [id number]  Basic Metabolic Panel  Result Value Ref Range   Glucose 97 65 - 99 mg/dL   BUN 17 8 - 27 mg/dL   Creatinine, Ser 1.00 0.57 - 1.00 mg/dL   GFR calc non Af Amer 59 (L) >59 mL/min/1.73   GFR calc Af Amer 68 >59 mL/min/1.73   BUN/Creatinine Ratio 17 12 - 28   Sodium 141 134 - 144 mmol/L   Potassium 4.4 3.5 - 5.2 mmol/L   Chloride 105 96 - 106 mmol/L   CO2 26 20 - 29 mmol/L   Calcium 10.0 8.7 - 10.3 mg/dL     Assessment and Plan: 1. Essential hypertension Patient to continue current medications and low-salt diet.  Advised to check blood pressure at least twice a  week with goal being 130/80 or lower.  2. Chronic anxiety - busPIRone (BUSPAR) 15 MG tablet; Take 1 tablet (15 mg total) by mouth 2 (two) times daily.  Dispense: 180 tablet; Refill: 3  3. Insomnia due to other mental disorder - traZODone (DESYREL) 50 MG tablet; TAKE 1 TABLET(50 MG) BY MOUTH AT BEDTIME  Dispense: 30 tablet; Refill: 4  4. Intention tremor Being tried with low-dose of primidone by the neurologist.  She will keep her follow-up appointment.   Follow Up Instructions: 4 mths   I discussed the assessment and treatment plan with the patient. The patient was provided an opportunity to ask questions and all were answered. The patient agreed with the plan and demonstrated an understanding of the instructions.   The patient was advised to call back or seek an in-person evaluation if the symptoms worsen or if the condition fails to improve as anticipated.  I provided 10 minutes of non-face-to-face time during this encounter.   Karle Plumber, MD

## 2020-08-09 ENCOUNTER — Other Ambulatory Visit: Payer: Self-pay | Admitting: Hematology and Oncology

## 2020-08-09 DIAGNOSIS — Z9889 Other specified postprocedural states: Secondary | ICD-10-CM

## 2020-08-13 ENCOUNTER — Other Ambulatory Visit: Payer: Self-pay | Admitting: Internal Medicine

## 2020-08-13 DIAGNOSIS — F419 Anxiety disorder, unspecified: Secondary | ICD-10-CM

## 2020-08-23 ENCOUNTER — Ambulatory Visit: Payer: Self-pay

## 2020-08-23 NOTE — Telephone Encounter (Signed)
Pt has an appt with you for tomorrow 11/12

## 2020-08-23 NOTE — Telephone Encounter (Signed)
°   EH 2  Kimberly [name] Female, 64 y.o., [birth year] MRN:  [id number] Phone:  [phone] Kimberly [name]) PCP:  Ladell Pier, MD Primary Cvg:  Medicare/Medicare Part A And B Next Appt With Nutrition & Diabetics 08/30/2020 at 11:30 AM Message from Scherrie Gerlach sent at 08/23/2020 9:09 AM EST  Pt has a rash on both outer arms. It can barely be seen. Bothers/itches.her more at night. Sometimes it itches during the day, Wants to know is she should have it looked at. She has had 2 - 3 weeks   Call History   Type Contact Phone User  08/23/2020 09:06 AM EST Phone (Incoming) [name], Kimberly D (Self) [phone] (H) Scherrie Gerlach   Pt. Reports she noticed a rash to both outer arms. Small, fine bumps. No redness. Itchy, mainly at night.No new soaps or lotions. Appointment made for tomorrow. Reason for Disposition  Localized rash present > 7 days  Answer Assessment - Initial Assessment Questions 1. APPEARANCE of RASH: "Describe the rash."      Fine rash 2. LOCATION: "Where is the rash located?"      Outer arms 3. NUMBER: "How many spots are there?"      Lots 4. SIZE: "How big are the spots?" (Inches, centimeters or compare to size of a coin)      [place] point 5. ONSET: "When did the rash start?"      2 weeks ago 6. ITCHING: "Does the rash itch?" If Yes, ask: "How bad is the itch?"  (Scale 1-10; or mild, moderate, severe)     5-6 7. PAIN: "Does the rash hurt?" If Yes, ask: "How bad is the pain?"  (Scale 1-10; or mild, moderate, severe)     No 8. OTHER SYMPTOMS: "Do you have any other symptoms?" (e.g., fever)     No 9. PREGNANCY: "Is there any chance you are pregnant?" "When was your last menstrual period?"     No  Protocols used: RASH OR REDNESS - LOCALIZED-A-AH

## 2020-08-24 ENCOUNTER — Other Ambulatory Visit: Payer: Self-pay

## 2020-08-24 ENCOUNTER — Encounter: Payer: Self-pay | Admitting: Family Medicine

## 2020-08-24 ENCOUNTER — Ambulatory Visit: Payer: Medicare Other | Attending: Family Medicine | Admitting: Family Medicine

## 2020-08-24 VITALS — BP 115/77 | HR 70 | Wt 140.8 lb

## 2020-08-24 DIAGNOSIS — Z791 Long term (current) use of non-steroidal anti-inflammatories (NSAID): Secondary | ICD-10-CM | POA: Insufficient documentation

## 2020-08-24 DIAGNOSIS — F419 Anxiety disorder, unspecified: Secondary | ICD-10-CM | POA: Diagnosis not present

## 2020-08-24 DIAGNOSIS — R21 Rash and other nonspecific skin eruption: Secondary | ICD-10-CM | POA: Diagnosis not present

## 2020-08-24 DIAGNOSIS — Z79899 Other long term (current) drug therapy: Secondary | ICD-10-CM | POA: Insufficient documentation

## 2020-08-24 DIAGNOSIS — F329 Major depressive disorder, single episode, unspecified: Secondary | ICD-10-CM | POA: Insufficient documentation

## 2020-08-24 DIAGNOSIS — Z885 Allergy status to narcotic agent status: Secondary | ICD-10-CM | POA: Diagnosis not present

## 2020-08-24 MED ORDER — PERMETHRIN 5 % EX CREA: TOPICAL_CREAM | CUTANEOUS | 0 refills | Status: DC

## 2020-08-24 MED ORDER — TRIAMCINOLONE ACETONIDE 0.1 % EX CREA: 1.0000 "application " | TOPICAL_CREAM | Freq: Two times a day (BID) | CUTANEOUS | 0 refills | Status: DC

## 2020-08-24 MED ORDER — HYDROXYZINE HCL 25 MG PO TABS: 25.0000 mg | ORAL_TABLET | Freq: Three times a day (TID) | ORAL | 0 refills | Status: DC | PRN

## 2020-08-24 NOTE — Progress Notes (Signed)
Have not tried any otc cream Rash on arms itches at night  Cannot locate triamcinolone cream so not using

## 2020-08-24 NOTE — Progress Notes (Signed)
Established Patient Office Visit  Subjective:  Patient ID: Kimberly [name], female    DOB: 08-05-1956  Age: 64 y.o. MRN: [id number]  CC: No chief complaint on file.   HPI Kimberly [name], 64 yo female who is a patient of Dr. Wynetta Emery, who presents secondary to complaint of an itchy rash which originally started on her wrists and then spread up her arms on both sides and is now at the mid arm/elbow area.  Itching related to the rash is worse at night.  She reports that the rash is probably been present for 2 to 3 weeks and she did not initially seek medical attention as the rash was mild and located just near the wrist but after the rash spread and has not improved, she decided to seek medical attention.  She has also noticed a few spots of the rash on her legs as well.  Past Medical History:  Diagnosis Date  . Anxiety 1995  . Cancer [facility])    breast left  . Depression 1995  . Family history of breast cancer   . Family history of lung cancer   . History of kidney stones   . History of rheumatic fever as a child   . Hypertension Dx Dec 2015  . Panic attack 1995  . Tremor     Past Surgical History:  Procedure Laterality Date  . APPENDECTOMY    . bowel obstruction    . 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 BREAST LUMPECTOMY WITH RADIOACTIVE SEED LOCALIZATION Left 10/14/2018   Procedure: BREAST LUMPECTOMY WITH RADIOACTIVE SEED LOCALIZATION X2;  Surgeon: Erroll Luna, MD;  Location: [facility];  Service: General;  Laterality: Left;  . CHOLECYSTECTOMY    . COLONOSCOPY WITH PROPOFOL N/A 08/05/2018   Procedure: COLONOSCOPY WITH PROPOFOL;  Surgeon: Daneil Dolin, MD;  [facility];  Service: Endoscopy;  Laterality: N/A;  12:45pm  . TONSILLECTOMY      Family History  Problem Relation Age of Onset  . Breast cancer Mother 81  . Cancer Maternal Grandmother        thinks it was braest, but not sure what type of cancer, dx >50  . Heart disease Paternal Grandfather   .  Lung cancer Maternal Aunt   . Colon cancer Neg Hx     Social History   Socioeconomic History  . Marital status: Single    Spouse name: Not on file  . Number of children: Not on file  . Years of education: Not on file  . Highest education level: Not on file  Occupational History  . Not on file  Tobacco Use  . Smoking status: Never Smoker  . Smokeless tobacco: Never Used  Vaping Use  . Vaping Use: Never used  Substance and Sexual Activity  . Alcohol use: No    Alcohol/week: 0.0 standard drinks  . Drug use: No  . Sexual activity: Yes    Birth control/protection: Post-menopausal  Other Topics Concern  . Not on file  Social History Narrative  . Not on file   Social Determinants of Health   Financial Resource Strain:   . Difficulty of Paying Living Expenses: Not on file  Food Insecurity:   . Worried About Charity fundraiser in the Last Year: Not on file  . Ran Out of Food in the Last Year: Not on file  Transportation Needs:   . Lack of Transportation (Medical): Not on file  . Lack of Transportation (Non-Medical):  Not on file  Physical Activity:   . Days of Exercise per Week: Not on file  . Minutes of Exercise per Session: Not on file  Stress:   . Feeling of Stress : Not on file  Social Connections:   . Frequency of Communication with Friends and Family: Not on file  . Frequency of Social Gatherings with Friends and Family: Not on file  . Attends Religious Services: Not on file  . Active Member of Clubs or Organizations: Not on file  . Attends Archivist Meetings: Not on file  . Marital Status: Not on file  Intimate Partner Violence:   . Fear of Current or Ex-Partner: Not on file  . Emotionally Abused: Not on file  . Physically Abused: Not on file  . Sexually Abused: Not on file    Outpatient Medications Prior to Visit  Medication Sig Dispense Refill  . amLODipine (NORVASC) 10 MG tablet TAKE 1 TABLET(10 MG) BY MOUTH DAILY 90 tablet 2  . atenolol  (TENORMIN) 25 MG tablet Take 1 tablet (25 mg total) by mouth daily. 90 tablet 3  . Blood Pressure Monitor DEVI Use as directed to check home blood pressure 2-3 times a week 1 Device 0  . busPIRone (BUSPAR) 15 MG tablet Take 1 tablet (15 mg total) by mouth 2 (two) times daily. 180 tablet 3  . ibuprofen (ADVIL,MOTRIN) 200 MG tablet Take 200 mg by mouth every 6 (six) hours as needed.    . meloxicam (MOBIC) 15 MG tablet TAKE 1 TABLET(15 MG) BY MOUTH DAILY 90 tablet 0  . Multiple Vitamin (MULTIVITAMIN WITH MINERALS) TABS tablet Take 1 tablet by mouth daily.     . primidone (MYSOLINE) 50 MG tablet 1/2 pill each bedtime x 2 weeks, then 1 pill nightly thereafter. 30 tablet 5  . tamoxifen (NOLVADEX) 20 MG tablet Take 1 tablet (20 mg total) by mouth daily. 90 tablet 3  . traZODone (DESYREL) 50 MG tablet TAKE 1 TABLET(50 MG) BY MOUTH AT BEDTIME 30 tablet 4  . triamcinolone cream (KENALOG) 0.1 % Apply 1 application topically 2 (two) times daily. 45 g 0   No facility-administered medications prior to visit.    Allergies  Allergen Reactions  . Codeine Other (See Comments)    Cold sweats    ROS Review of Systems  Constitutional: Positive for fatigue. Negative for chills and fever.  Respiratory: Negative for cough and shortness of breath.   Cardiovascular: Negative for chest pain and palpitations.  Gastrointestinal: Negative for abdominal pain, constipation, diarrhea and nausea.  Genitourinary: Negative for dysuria and frequency.  Skin: Positive for rash. Negative for wound.  Neurological: Negative for dizziness and headaches.  Hematological: Negative for adenopathy. Does not bruise/bleed easily.  Psychiatric/Behavioral: Positive for sleep disturbance. The patient is not nervous/anxious.       Objective:    Physical Exam Vitals and nursing note reviewed.  Constitutional:      Appearance: Normal appearance.     Comments: Thin framed elderly female in no acute distress but with visible rash on  the bilateral forearms  Cardiovascular:     Rate and Rhythm: Normal rate and regular rhythm.  Pulmonary:     Effort: Pulmonary effort is normal.     Breath sounds: Normal breath sounds.  Skin:    General: Skin is warm and dry.     Findings: Rash present.     Comments: Patient with mixture of maculopapular rash with mild erythema of the rash which extends from the  dorsal wrist near the palm up the forearms bilaterally to the antecubital fossa/sides of the elbow  Neurological:     Mental Status: She is alert.  Psychiatric:        Mood and Affect: Mood normal.        Behavior: Behavior normal.     BP 115/77 (BP Location: Left Arm, Patient Position: Sitting)   Pulse 70   Wt 140 lb 12.8 oz (63.9 kg)   SpO2 97%   BMI 24.17 kg/m  Wt Readings from Last 3 Encounters:  05/30/20 145 lb (65.8 kg)  04/19/20 142 lb 12.8 oz (64.8 kg)  09/12/19 152 lb 11.2 oz (69.3 kg)     Health Maintenance Due  Topic Date Due  . PNA vac Low Risk Adult (1 of 2 - PCV13) 10/08/2019      Lab Results  Component Value Date   TSH 1.910 04/19/2020   Lab Results  Component Value Date   WBC 6.4 04/19/2020   HGB 13.7 04/19/2020   HCT 40.2 04/19/2020   MCV 92 04/19/2020   PLT 396 04/19/2020   Lab Results  Component Value Date   NA 141 04/19/2020   K 4.4 04/19/2020   CO2 26 04/19/2020   GLUCOSE 97 04/19/2020   BUN 17 04/19/2020   CREATININE 1.00 04/19/2020   BILITOT 0.2 12/20/2019   ALKPHOS 63 12/20/2019   AST 14 12/20/2019   ALT 9 12/20/2019   PROT 7.1 12/20/2019   ALBUMIN 4.2 12/20/2019   CALCIUM 10.0 04/19/2020   ANIONGAP 7 10/08/2018   Lab Results  Component Value Date   CHOL 153 12/20/2019   Lab Results  Component Value Date   HDL 59 12/20/2019   Lab Results  Component Value Date   LDLCALC 76 12/20/2019   Lab Results  Component Value Date   TRIG 98 12/20/2019   Lab Results  Component Value Date   CHOLHDL 2.6 12/20/2019   Lab Results  Component Value Date   HGBA1C 5.6  04/19/2020      Assessment & Plan:  1. Rash and nonspecific skin eruption Patient with complaint of rash on the bilateral arms that began at the wrist and has spread up the arms and rash is itchy at night.  She also reports some rash on the lower legs but this was not visualized at today's visit as patient could not pull her pants legs up sufficiently for rash to be reviewed.  Based on her description of the spread of the rash and increased itchiness at night, will have patient try Elimite cream in case she has infection related to scabies.  She is to use this prescription first and she was made aware that she should apply the cream, allowed to dry and wash off after 8 hours.  Prescription was also provided for hydroxyzine 25 mg to take every 8 hours as needed for itching or otherwise take at bedtime to help with itching which may be interfering with patient's sleep.  After use of Elimite cream, she may then use prescription triamcinolone cream once or twice daily as needed to help with rash and itching.  If she has had no improvement in rash she has been asked to return to clinic in approximately 2 weeks, sooner if there is any worsening of rash or other concerns. - permethrin (ELIMITE) 5 % cream; Apply to arms and legs, let dry and wash off after 8 hours; may repeat in 1 week if needed  Dispense: 60 g; Refill: 0 -  triamcinolone cream (KENALOG) 0.1 %; Apply 1 application topically 2 (two) times daily.  Dispense: 45 g; Refill: 0 - hydrOXYzine (ATARAX/VISTARIL) 25 MG tablet; Take 1 tablet (25 mg total) by mouth 3 (three) times daily as needed. For itching; may cause sleepiness  Dispense: 30 tablet; Refill: 0     Follow-up: Return for rash- follow-up in 1-2 weeks if not better.   Antony Blackbird, MD

## 2020-08-29 ENCOUNTER — Ambulatory Visit: Payer: Medicare Other | Admitting: Dietician

## 2020-08-29 NOTE — Progress Notes (Addendum)
Chief Complaint  Patient presents with  . Essential Tremors    rm 1  . Follow-up    pt said the tremors are no better.     HISTORY OF PRESENT ILLNESS: Today 08/30/20  Kimberly [name] is a 64 y.o. female here today for follow up for essential tremor. She was started on primidone 50mg  at bedtime following approval from oncology due to potential effects on tamoxifen levels. Oncology approved taking 1/2 tablet daily. She has continues 25mg  daily and feels it may help a little during the day but tremor worsens at night. She feels that inconsistent sleep patterns and anxiety contribute. She has follow up with oncology next month and PCP in February.    HISTORY (copied from Dr Guadelupe Sabin previous note)  Dear Dr. Wynetta Emery,  I saw your patient, Kimberly [name], upon your kind request in my neurologic clinic today for Reevaluation of her tremor disorder.  The patient is unaccompanied today. As you know, Ms. Jaroszewski is a 64 year old right-handed woman with an underlying medical history of hypertension, rheumatic fever as a child, kidney stones, breast cancer, depression, and anxiety, who reports worsening tremors affecting her hands, particularly her right hand and also her neck and head area.  She has not had any recent falls.  She does have some issues with her balance however.  She tries to hydrate well with water but sometimes forgets to drink water.  She does not drink any caffeine or alcohol on a regular basis.  I have evaluated her a little over 2 years ago for hand tremors, at which time her tremors were mild.  She had a nonfocal neurologic exam.  I suggested we proceed with a brain MRI to rule out a structural cause.  She had a brain MRI with and without contrast on 03/15/2018 and I reviewed the results: IMPRESSION: This MRI of the brain with and without contrast shows the following: 1.   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  It is  next to the head of the right caudate but does not appear to involve the basal ganglia. 2.   Scattered foci in the subcortical and deep white matter of the hemispheres consistent with chronic microvascular ischemic changes.  None of the foci appear to be acute. 3.   There is asymmetry of the pituitary gland.  The gland appears to enhance homogenously.  The pituitary stalk is midline.  This could be an incidental finding or due to a microadenoma.  If clinically indicated, consider dedicated pituitary imaging. 4.   Chronic ethmoid and right frontal sinusitis. 5.   There are no acute findings.  We called her with her test results and she was advised to proceed with a dedicated pituitary gland MRI.  She was agreeable to this.  She had a repeat brain MRI with special attention to the pituitary gland on 03/31/2018 with and without contrast and I reviewed the results:  IMPRESSION:   MRI brain and pituitary (with and without) demonstrating: 1.  Mild scattered periventricular subcortical foci of nonspecific gliosis. 2.  Dedicated pituitary gland imaging appears normal enhancing pituitary gland tissue.  No evidence of underlying pituitary mass. 3.  Incidental right frontal developmental venous anomaly.  She reports no other new symptoms, reports no pain no weakness, no facial symptoms otherwise.  She does have some voice tremor.  She reports difficulty with sleep, she takes trazodone for sleep but is often still awake at night and  tosses and turns.  She is not sure that the tremor is affecting her sleep.  Previously:   02/23/18: 64 year old right-handed woman with an underlying medical history of anxiety, hypertension, seasonal allergies, insomnia, and overweight state, who reports a bilateral hand tremor for the past 1-2 years. I reviewed your office note from 12/03/2017. She has been on atenolol, 25 mg once daily, she has not noticed much in the way of tremor benefit from it. She admits that she does not  drink enough water, sometimes only 1 or 2 cups per day, does not drink soda daily, and does not like caffeine in the form of tea or coffee. Sometimes she drinks some juice.  She is single and lives with her daughter, she has 1 grown daughter. She is a nonsmoker and does not utilize alcohol. She had routine blood work last year in February which I reviewed, TSH was normal at the time. She has a follow-up appointment next week. She is not familiar with her father side of her family history. She has 1 brother and 1 sister, neither one with tremors. Her mother did not have a tremor, she died at 27 from cancer. 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    REVIEW OF SYSTEMS: Out of a complete 14 system review of symptoms, the patient complains only of the following symptoms, tremor, anxiety, insomnia and all other reviewed systems are negative.   ALLERGIES: Allergies  Allergen Reactions  . Codeine Other (See Comments)    Cold sweats     HOME MEDICATIONS: Outpatient Medications Prior to Visit  Medication Sig Dispense Refill  . amLODipine (NORVASC) 10 MG tablet TAKE 1 TABLET(10 MG) BY MOUTH DAILY 90 tablet 2  . atenolol (TENORMIN) 25 MG tablet Take 1 tablet (25 mg total) by mouth daily. 90 tablet 3  . Blood Pressure Monitor DEVI Use as directed to check home blood pressure 2-3 times a week 1 Device 0  . busPIRone (BUSPAR) 15 MG tablet Take 1 tablet (15 mg total) by mouth 2 (two) times daily. 180 tablet 3  . hydrOXYzine (ATARAX/VISTARIL) 25 MG tablet Take 1 tablet (25 mg total) by mouth 3 (three) times daily as needed. For itching; may cause sleepiness 30 tablet 0  . ibuprofen (ADVIL,MOTRIN) 200 MG tablet Take 200 mg by mouth every 6 (six) hours as needed.    . meloxicam (MOBIC) 15 MG tablet TAKE 1 TABLET(15 MG) BY MOUTH DAILY 90 tablet 0  . Multiple Vitamin (MULTIVITAMIN WITH MINERALS) TABS tablet Take 1 tablet by mouth daily.     . permethrin (ELIMITE) 5 % cream Apply to arms and legs, let dry and  wash off after 8 hours; may repeat in 1 week if needed 60 g 0  . primidone (MYSOLINE) 50 MG tablet 1/2 pill each bedtime x 2 weeks, then 1 pill nightly thereafter. 30 tablet 5  . tamoxifen (NOLVADEX) 20 MG tablet Take 1 tablet (20 mg total) by mouth daily. 90 tablet 3  . traZODone (DESYREL) 50 MG tablet TAKE 1 TABLET(50 MG) BY MOUTH AT BEDTIME 30 tablet 4  . triamcinolone cream (KENALOG) 0.1 % Apply 1 application topically 2 (two) times daily. 45 g 0   No facility-administered medications prior to visit.     PAST MEDICAL HISTORY: Past Medical History:  Diagnosis Date  . Anxiety 1995  . Cancer [facility])    breast left  . Depression 1995  . Family history of breast cancer   . Family history of lung cancer   .  History of kidney stones   . History of rheumatic fever as a child   . Hypertension Dx Dec 2015  . Panic attack 1995  . Tremor      PAST SURGICAL HISTORY: Past Surgical History:  Procedure Laterality Date  . APPENDECTOMY    . bowel obstruction    . 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 BREAST LUMPECTOMY WITH RADIOACTIVE SEED LOCALIZATION Left 10/14/2018   Procedure: BREAST LUMPECTOMY WITH RADIOACTIVE SEED LOCALIZATION X2;  Surgeon: Erroll Luna, MD;  Location: [facility];  Service: General;  Laterality: Left;  . CHOLECYSTECTOMY    . COLONOSCOPY WITH PROPOFOL N/A 08/05/2018   Procedure: COLONOSCOPY WITH PROPOFOL;  Surgeon: Daneil Dolin, MD;  [facility];  Service: Endoscopy;  Laterality: N/A;  12:45pm  . TONSILLECTOMY       FAMILY HISTORY: Family History  Problem Relation Age of Onset  . Breast cancer Mother 33  . Cancer Maternal Grandmother        thinks it was braest, but not sure what type of cancer, dx >50  . Heart disease Paternal Grandfather   . Lung cancer Maternal Aunt   . Colon cancer Neg Hx      SOCIAL HISTORY: Social History   Socioeconomic History  . Marital status: Single    Spouse name: Not on file  . Number of children: Not on  file  . Years of education: Not on file  . Highest education level: Not on file  Occupational History  . Not on file  Tobacco Use  . Smoking status: Never Smoker  . Smokeless tobacco: Never Used  Vaping Use  . Vaping Use: Never used  Substance and Sexual Activity  . Alcohol use: No    Alcohol/week: 0.0 standard drinks  . Drug use: No  . Sexual activity: Yes    Birth control/protection: Post-menopausal  Other Topics Concern  . Not on file  Social History Narrative  . Not on file   Social Determinants of Health   Financial Resource Strain:   . Difficulty of Paying Living Expenses: Not on file  Food Insecurity:   . Worried About Charity fundraiser in the Last Year: Not on file  . Ran Out of Food in the Last Year: Not on file  Transportation Needs:   . Lack of Transportation (Medical): Not on file  . Lack of Transportation (Non-Medical): Not on file  Physical Activity:   . Days of Exercise per Week: Not on file  . Minutes of Exercise per Session: Not on file  Stress:   . Feeling of Stress : Not on file  Social Connections:   . Frequency of Communication with Friends and Family: Not on file  . Frequency of Social Gatherings with Friends and Family: Not on file  . Attends Religious Services: Not on file  . Active Member of Clubs or Organizations: Not on file  . Attends Archivist Meetings: Not on file  . Marital Status: Not on file  Intimate Partner Violence:   . Fear of Current or Ex-Partner: Not on file  . Emotionally Abused: Not on file  . Physically Abused: Not on file  . Sexually Abused: Not on file      PHYSICAL EXAM  Vitals:   08/30/20 1246  BP: 134/84  Pulse: 74  Weight: 145 lb (65.8 kg)  Height: 5\' 3"  (1.6 m)   Body mass index is 25.69 kg/m.   Generalized: Well developed, in no acute  distress  Cardiology: normal rate and rhythm, no murmur auscultated  Respiratory: clear to auscultation bilaterally    Neurological examination    Mentation: Alert oriented to time, place, history taking. Follows all commands speech and language fluent Cranial nerve II-XII: Pupils were equal round reactive to light. Extraocular movements were full, visual field were full on confrontational test. Facial sensation and strength were normal. Head turning and shoulder shrug  were normal and symmetric. Motor: The motor testing reveals 5 over 5 strength of all 4 extremities. Good symmetric motor tone is noted throughout. Moderate action tremor of bilateral hands and head noted during visit  Gait and station: Gait is normal.     DIAGNOSTIC DATA (LABS, IMAGING, TESTING) - I reviewed patient records, labs, notes, testing and imaging myself where available.  Lab Results  Component Value Date   WBC 6.4 04/19/2020   HGB 13.7 04/19/2020   HCT 40.2 04/19/2020   MCV 92 04/19/2020   PLT 396 04/19/2020      Component Value Date/Time   NA 141 04/19/2020 1432   K 4.4 04/19/2020 1432   CL 105 04/19/2020 1432   CO2 26 04/19/2020 1432   GLUCOSE 97 04/19/2020 1432   GLUCOSE 104 (H) 10/08/2018 0919   BUN 17 04/19/2020 1432   CREATININE 1.00 04/19/2020 1432   CREATININE 0.92 09/15/2018 0820   CREATININE 0.98 11/13/2016 1102   CALCIUM 10.0 04/19/2020 1432   PROT 7.1 12/20/2019 1029   ALBUMIN 4.2 12/20/2019 1029   AST 14 12/20/2019 1029   AST 16 09/15/2018 0820   ALT 9 12/20/2019 1029   ALT 11 09/15/2018 0820   ALKPHOS 63 12/20/2019 1029   BILITOT 0.2 12/20/2019 1029   BILITOT 0.4 09/15/2018 0820   GFRNONAA 59 (L) 04/19/2020 1432   GFRNONAA >60 09/15/2018 0820   GFRNONAA 62 11/13/2016 1102   GFRAA 68 04/19/2020 1432   GFRAA >60 09/15/2018 0820   GFRAA 71 11/13/2016 1102   Lab Results  Component Value Date   CHOL 153 12/20/2019   HDL 59 12/20/2019   LDLCALC 76 12/20/2019   TRIG 98 12/20/2019   CHOLHDL 2.6 12/20/2019   Lab Results  Component Value Date   HGBA1C 5.6 04/19/2020   No results found for: VITAMINB12 Lab Results   Component Value Date   TSH 1.910 04/19/2020      ASSESSMENT AND PLAN  64 y.o. year old female  has a past medical history of Anxiety (1995), Cancer ([facility]), Depression (1995), Family history of breast cancer, Family history of lung cancer, History of kidney stones, History of rheumatic fever as a child, Hypertension (Dx Dec 2015), Panic attack (1995), and Tremor. here with   Essential tremor  Angelice has tolerated primidone 25mg  daily. She feels that it has helped some with daytime management but she continues to have difficulty at night. She is not interested in surgical procedures. She is taking atenolol 25mg  daily. Oncology has approved primidone 25mg  for now. She wishes to talk to him about increasing dose to 50mg  to see if this will help. She also wishes to talk with PCP regarding management of anxiety as this significantly worsens tremor. She will work on sleep hygiene. Weighted utensils or wrists weights may be helpful/Healthy lifestyle habits encouraged. She will follow up in 6 months, sooner if needed.   I spent 20 minutes of face-to-face and non-face-to-face time with patient.  This included previsit chart review, lab review, study review, order entry, electronic health record documentation, patient education.  Debbora Presto, MSN, FNP-C 08/30/2020, 1:38 PM  [facility] [address] [place], [place] [address] [phone]   I reviewed the above note and documentation by the Nurse Practitioner and agree with the history, exam, assessment and plan as outlined above. I was available for consultation. Star Age, MD, PhD  [facility])

## 2020-08-29 NOTE — Patient Instructions (Addendum)
Below is our plan:  We will continue primidone 25mg  (1/2 tablet) for now. Please discuss increasing dose to 50mg  with your oncologist. I will send him our notes from today as well. Continue close follow up with PCP for management of anxiety. Sleep hygiene encouraged. Consider weighted utensils. Consider referral to movement specialist for discussion of procedures that may help with tremor.   Please make sure you are staying well hydrated. I recommend 50-60 ounces daily. Well balanced diet and regular exercise encouraged.    Please continue follow up with care team as directed.   Follow up in 6 months  You may receive a survey regarding today's visit. I encourage you to leave honest feed back as I do use this information to improve patient care. Thank you for seeing me today!     Essential Tremor A tremor is trembling or shaking that a person cannot control. Most tremors affect the hands or arms. Tremors can also affect the head, vocal cords, legs, and other parts of the body. Essential tremor is a tremor without a known cause. Usually, it occurs while a person is trying to perform an action. It tends to get worse gradually as a person ages. What are the causes? The cause of this condition is not known. What increases the risk? You are more likely to develop this condition if:  You have a family member with essential tremor.  You are age 94 or older.  You take certain medicines. What are the signs or symptoms? The main sign of a tremor is a rhythmic shaking of certain parts of your body that is uncontrolled and unintentional. You may:  Have difficulty eating with a spoon or fork.  Have difficulty writing.  Nod your head up and down or side to side.  Have a quivering voice. The shaking may:  Get worse over time.  Come and go.  Be more noticeable on one side of your body.  Get worse due to stress, fatigue, caffeine, and extreme heat or cold. How is this diagnosed? This  condition may be diagnosed based on:  Your symptoms and medical history.  A physical exam. There is no single test to diagnose an essential tremor. However, your health care provider may order tests to rule out other causes of your condition. These may include:  Blood and urine tests.  Imaging studies of your brain, such as CT scan and MRI.  A test that measures involuntary muscle movement (electromyogram). How is this treated? Treatment for essential tremor depends on the severity of the condition.  Some tremors may go away without treatment.  Mild tremors may not need treatment if they do not affect your day-to-day life.  Severe tremors may need to be treated using one or more of the following options: ? Medicines. ? Lifestyle changes. ? Occupational or physical therapy. Follow these instructions at home: Lifestyle   Do not use any products that contain nicotine or tobacco, such as cigarettes and e-cigarettes. If you need help quitting, ask your health care provider.  Limit your caffeine intake as told by your health care provider.  Try to get 8 hours of sleep each night.  Find ways to manage your stress that fits your lifestyle and personality. Consider trying meditation or yoga.  Try to anticipate stressful situations and allow extra time to manage them.  If you are struggling emotionally with the effects of your tremor, consider working with a mental health provider. General instructions  Take over-the-counter and prescription medicines  only as told by your health care provider.  Avoid extreme heat and extreme cold.  Keep all follow-up visits as told by your health care provider. This is important. Visits may include physical therapy visits. Contact a health care provider if:  You experience any changes in the location or intensity of your tremors.  You start having a tremor after starting a new medicine.  You have tremor with other symptoms, such  as: ? Numbness. ? Tingling. ? Pain. ? Weakness.  Your tremor gets worse.  Your tremor interferes with your daily life.  You feel down, blue, or sad for at least 2 weeks in a row.  Worrying about your tremor and what other people think about you interferes with your everyday life functions, including relationships, work, or school. Summary  Essential tremor is a tremor without a known cause. Usually, it occurs when you are trying to perform an action.  The cause of this condition is not known.  The main sign of a tremor is a rhythmic shaking of certain parts of your body that is uncontrolled and unintentional.  Treatment for essential tremor depends on the severity of the condition. This information is not intended to replace advice given to you by your health care provider. Make sure you discuss any questions you have with your health care provider. Document Revised: 10/09/2017 Document Reviewed: 10/09/2017 Elsevier Patient Education  2020 [employer].

## 2020-08-30 ENCOUNTER — Ambulatory Visit (INDEPENDENT_AMBULATORY_CARE_PROVIDER_SITE_OTHER): Payer: Medicare Other | Admitting: Family Medicine

## 2020-08-30 ENCOUNTER — Ambulatory Visit: Payer: Medicare Other | Admitting: Dietician

## 2020-08-30 ENCOUNTER — Encounter: Payer: Self-pay | Admitting: Family Medicine

## 2020-08-30 VITALS — BP 134/84 | HR 74 | Ht 63.0 in | Wt 145.0 lb

## 2020-08-30 DIAGNOSIS — G250 Essential tremor: Secondary | ICD-10-CM

## 2020-09-05 ENCOUNTER — Other Ambulatory Visit: Payer: Self-pay | Admitting: Hematology and Oncology

## 2020-09-10 NOTE — Assessment & Plan Note (Signed)
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 The smaller DCIS 0.1 cm from inferior margin, ER 100%, PR 90%, Tis NX stage 0  Treatment plan: 1.Adjuvant radiation therapy2/08/2019-12/14/2018 2.Followed by adjuvant antiestrogen therapy with tamoxifen x5 years  Tamoxifen toxicities: Occasional hot flashes but no other side effects.  Breast cancer surveillance: 1.  Breast exam 09/11/2020: Benign 2.  Mammogram scheduled for 09/14/2020  I renewed her prescription for tamoxifen. Return to clinic in 1 year for follow-up

## 2020-09-10 NOTE — Progress Notes (Signed)
Patient Care Team: Ladell Pier, MD as PCP - General (Internal Medicine) Gala Romney Cristopher Estimable, MD as Consulting Physician (Gastroenterology) Erroll Luna, MD as Consulting Physician (General Surgery) Nicholas Lose, MD as Consulting Physician (Hematology and Oncology) Kyung Rudd, MD as Consulting Physician (Radiation Oncology)  DIAGNOSIS:    ICD-10-CM   1. Ductal carcinoma in situ (DCIS) of left breast  D05.12     SUMMARY OF ONCOLOGIC HISTORY: Oncology History  Ductal carcinoma in situ (DCIS) of left breast  09/07/2018 Initial Diagnosis   Bilateral masses and calcifications, right breast benign, left breast UOQ 3.2 cm biopsy revealed intermediate grade to high-grade DCIS ER 100%, PR 100%, 4 o'clock position retroareolar calcifications 2.2 cm biopsy revealed intermediate grade DCIS with [facility] ER 100%, PR 90%, both lesions are 5.5 cm apart, Tis NX stage 0   10/14/2018 Surgery   Left breast lumpectomy x2: 2 foci of DCIS intermediate grade 1.6 cm and 1.1 cm, LCIS, final margins negative.  The smaller DCIS 0.1 cm from inferior margin, ER 100%, PR 90%, Tis NX stage 0   10/27/2018 Cancer Staging   Staging form: Breast, AJCC 8th Edition - Pathologic: Stage 0 (pTis (DCIS), pN0, cM0, ER+, PR+) - Signed by Gardenia Phlegm, NP on 10/27/2018   11/23/2018 - 12/17/2018 Radiation Therapy   Adjuvant radiation   01/2019 -  Anti-estrogen oral therapy   Tamoxifen daily     CHIEF COMPLIANT: Follow-up of left breast DCIS on tamoxifen  INTERVAL HISTORY: Kimberly [name] is a 64 y.o. with above-mentioned history of left breast DCIS treated with a lumpectomy x2, radiation, and is currently on tamoxifen. Mammogram on 09/14/19 showed no evidence of malignancy bilaterally. She presents to the clinic today for follow-up.   She reports no issues tolerating tamoxifen therapy.  Denies any hot flashes or muscle aches.  Occasional mild discomfort in the breast but otherwise without any problems or concerns.   Her mammogram is been scheduled for this Friday.    ALLERGIES:  is allergic to codeine.  MEDICATIONS:  Current Outpatient Medications  Medication Sig Dispense Refill  . amLODipine (NORVASC) 10 MG tablet TAKE 1 TABLET(10 MG) BY MOUTH DAILY 90 tablet 2  . atenolol (TENORMIN) 25 MG tablet Take 1 tablet (25 mg total) by mouth daily. 90 tablet 3  . Blood Pressure Monitor DEVI Use as directed to check home blood pressure 2-3 times a week 1 Device 0  . busPIRone (BUSPAR) 15 MG tablet Take 1 tablet (15 mg total) by mouth 2 (two) times daily. 180 tablet 3  . hydrOXYzine (ATARAX/VISTARIL) 25 MG tablet Take 1 tablet (25 mg total) by mouth 3 (three) times daily as needed. For itching; may cause sleepiness 30 tablet 0  . ibuprofen (ADVIL,MOTRIN) 200 MG tablet Take 200 mg by mouth every 6 (six) hours as needed.    . meloxicam (MOBIC) 15 MG tablet TAKE 1 TABLET(15 MG) BY MOUTH DAILY 90 tablet 0  . Multiple Vitamin (MULTIVITAMIN WITH MINERALS) TABS tablet Take 1 tablet by mouth daily.     . permethrin (ELIMITE) 5 % cream Apply to arms and legs, let dry and wash off after 8 hours; may repeat in 1 week if needed 60 g 0  . primidone (MYSOLINE) 50 MG tablet 1/2 pill each bedtime x 2 weeks, then 1 pill nightly thereafter. 30 tablet 5  . tamoxifen (NOLVADEX) 20 MG tablet TAKE 1 TABLET(20 MG) BY MOUTH DAILY 90 tablet 0  . traZODone (DESYREL) 50 MG tablet TAKE 1 TABLET(50  MG) BY MOUTH AT BEDTIME 30 tablet 4  . triamcinolone cream (KENALOG) 0.1 % Apply 1 application topically 2 (two) times daily. 45 g 0   No current facility-administered medications for this visit.    PHYSICAL EXAMINATION: ECOG PERFORMANCE STATUS: 1 - Symptomatic but completely ambulatory  Vitals:   09/11/20 1028  BP: 128/74  Pulse: 76  Resp: 17  Temp: 98.7 F (37.1 C)  SpO2: 99%   Filed Weights   09/11/20 1028  Weight: 145 lb 6.4 oz (66 kg)    BREAST: No palpable masses or nodules in either right or left breasts. No palpable  axillary supraclavicular or infraclavicular adenopathy no breast tenderness or nipple discharge. (exam performed in the presence of a chaperone)  LABORATORY DATA:  I have reviewed the data as listed CMP Latest Ref Rng & Units 04/19/2020 12/20/2019 10/08/2018  Glucose 65 - 99 mg/dL 97 202(H) 104(H)  BUN 8 - 27 mg/dL 17 11 15   Creatinine 0.57 - 1.00 mg/dL 1.00 0.95 0.95  Sodium 134 - 144 mmol/L 141 139 139  Potassium 3.5 - 5.2 mmol/L 4.4 4.6 4.6  Chloride 96 - 106 mmol/L 105 102 107  CO2 20 - 29 mmol/L 26 21 25   Calcium 8.7 - 10.3 mg/dL 10.0 9.9 9.9  Total Protein 6.0 - 8.5 g/dL - 7.1 7.8  Total Bilirubin 0.0 - 1.2 mg/dL - 0.2 0.4  Alkaline Phos 39 - 117 IU/L - 63 74  AST 0 - 40 IU/L - 14 19  ALT 0 - 32 IU/L - 9 15    Lab Results  Component Value Date   WBC 6.4 04/19/2020   HGB 13.7 04/19/2020   HCT 40.2 04/19/2020   MCV 92 04/19/2020   PLT 396 04/19/2020   NEUTROABS 3.3 10/08/2018    ASSESSMENT & PLAN:  Ductal carcinoma in situ (DCIS) of left breast 10/14/2018:Left breast lumpectomy x2: 2 foci of DCIS intermediate grade 1.6 cm and 1.1 cm, LCIS, final margins negative. The smaller DCIS 0.1 cm from inferior margin, ER 100%, PR 90%, Tis NX stage 0  Treatment plan: 1.Adjuvant radiation therapy2/08/2019-12/14/2018 2.Followed by adjuvant antiestrogen therapy with tamoxifen x5 years  Tamoxifen toxicities:  Denies any hot flashes or myalgias.  Breast cancer surveillance: 1.  Breast exam 09/11/2020: Benign 2.  Mammogram scheduled for 09/14/2020  I renewed her prescription for tamoxifen. Return to clinic in 1 year for follow-up    No orders of the defined types were placed in this encounter.  The patient has a good understanding of the overall plan. she agrees with it. she will call with any problems that may develop before the next visit here.  Total time spent: 20 mins including face to face time and time spent for planning, charting and coordination of care  Nicholas Lose, MD 09/11/2020  I, Cloyde Reams Dorshimer, am acting as scribe for Dr. Nicholas Lose.  I have reviewed the above documentation for accuracy and completeness, and I agree with the above.

## 2020-09-11 ENCOUNTER — Inpatient Hospital Stay: Payer: Medicare Other | Attending: Hematology and Oncology | Admitting: Hematology and Oncology

## 2020-09-11 ENCOUNTER — Other Ambulatory Visit: Payer: Self-pay

## 2020-09-11 ENCOUNTER — Telehealth: Payer: Self-pay | Admitting: *Deleted

## 2020-09-11 ENCOUNTER — Ambulatory Visit: Payer: Medicare Other

## 2020-09-11 ENCOUNTER — Telehealth: Payer: Self-pay | Admitting: Hematology and Oncology

## 2020-09-11 DIAGNOSIS — Z923 Personal history of irradiation: Secondary | ICD-10-CM | POA: Diagnosis not present

## 2020-09-11 DIAGNOSIS — Z79810 Long term (current) use of selective estrogen receptor modulators (SERMs): Secondary | ICD-10-CM | POA: Diagnosis not present

## 2020-09-11 DIAGNOSIS — D0512 Intraductal carcinoma in situ of left breast: Secondary | ICD-10-CM | POA: Insufficient documentation

## 2020-09-11 MED ORDER — TAMOXIFEN CITRATE 20 MG PO TABS: ORAL_TABLET | ORAL | 3 refills | Status: DC

## 2020-09-11 NOTE — Telephone Encounter (Signed)
tcf-Airel about taking her covid booster has been 45 days since she has rec'd her second injection.  Has a mammogram on Friday [id number]/advised that as long as she was masked and socially distancing that she could wait and get the booster after her mammogram on Friday.  Will set up appt for booster on Friday afternoon after the mammogram.

## 2020-09-11 NOTE — Telephone Encounter (Signed)
Scheduled appts per 11/30 los. Gave pt a print out of AVS.  

## 2020-09-13 ENCOUNTER — Telehealth: Payer: Self-pay

## 2020-09-13 NOTE — Telephone Encounter (Signed)
Hydroxyzine PA approved through 09/12/21

## 2020-09-14 ENCOUNTER — Ambulatory Visit
Admission: RE | Admit: 2020-09-14 | Discharge: 2020-09-14 | Disposition: A | Discharge status: Home / Self Care | Payer: Medicare Other | Source: Ambulatory Visit | Attending: Hematology and Oncology | Admitting: Hematology and Oncology

## 2020-09-14 ENCOUNTER — Other Ambulatory Visit: Payer: Self-pay

## 2020-09-14 DIAGNOSIS — Z9889 Other specified postprocedural states: Secondary | ICD-10-CM

## 2020-10-05 ENCOUNTER — Other Ambulatory Visit: Payer: Self-pay | Admitting: Internal Medicine

## 2020-10-05 DIAGNOSIS — I10 Essential (primary) hypertension: Secondary | ICD-10-CM

## 2020-10-07 NOTE — Telephone Encounter (Signed)
Requested Prescriptions  Pending Prescriptions Disp Refills  . amLODipine (NORVASC) 10 MG tablet [Pharmacy Med Name: AMLODIPINE BESYLATE 10MG  TABLETS] 90 tablet 1    Sig: TAKE 1 TABLET(10 MG) BY MOUTH DAILY     Cardiovascular:  Calcium Channel Blockers Passed - 10/05/2020  7:02 AM      Passed - Last BP in normal range    BP Readings from Last 1 Encounters:  09/11/20 128/74         Passed - Valid encounter within last 6 months    Recent Outpatient Visits          1 month ago Rash and nonspecific skin eruption   [place], MD   2 months ago Essential hypertension   [place], MD   5 months ago Essential hypertension   [place], MD   9 months ago Essential hypertension   [place], MD   1 year ago Rash and nonspecific skin eruption   [place], MD      Future Appointments            In 1 month Wynetta Emery, Dalbert Batman, MD [place]

## 2020-10-09 ENCOUNTER — Other Ambulatory Visit: Payer: Self-pay

## 2020-10-09 MED ORDER — MELOXICAM 15 MG PO TABS: ORAL_TABLET | ORAL | 0 refills | Status: DC

## 2020-10-19 ENCOUNTER — Ambulatory Visit: Payer: Medicare Other

## 2020-10-23 IMAGING — MG MM CLIP PLACEMENT
2 series · 2 of 2 positions shown · non-contrast
Comparison: Previous exam(s).

CLINICAL DATA: Bilateral stereotactic biopsies were performed of
bilateral breast calcifications.

EXAM:
DIAGNOSTIC BILATERAL MAMMOGRAM POST STEREOTACTIC BIOPSY

[R LM]
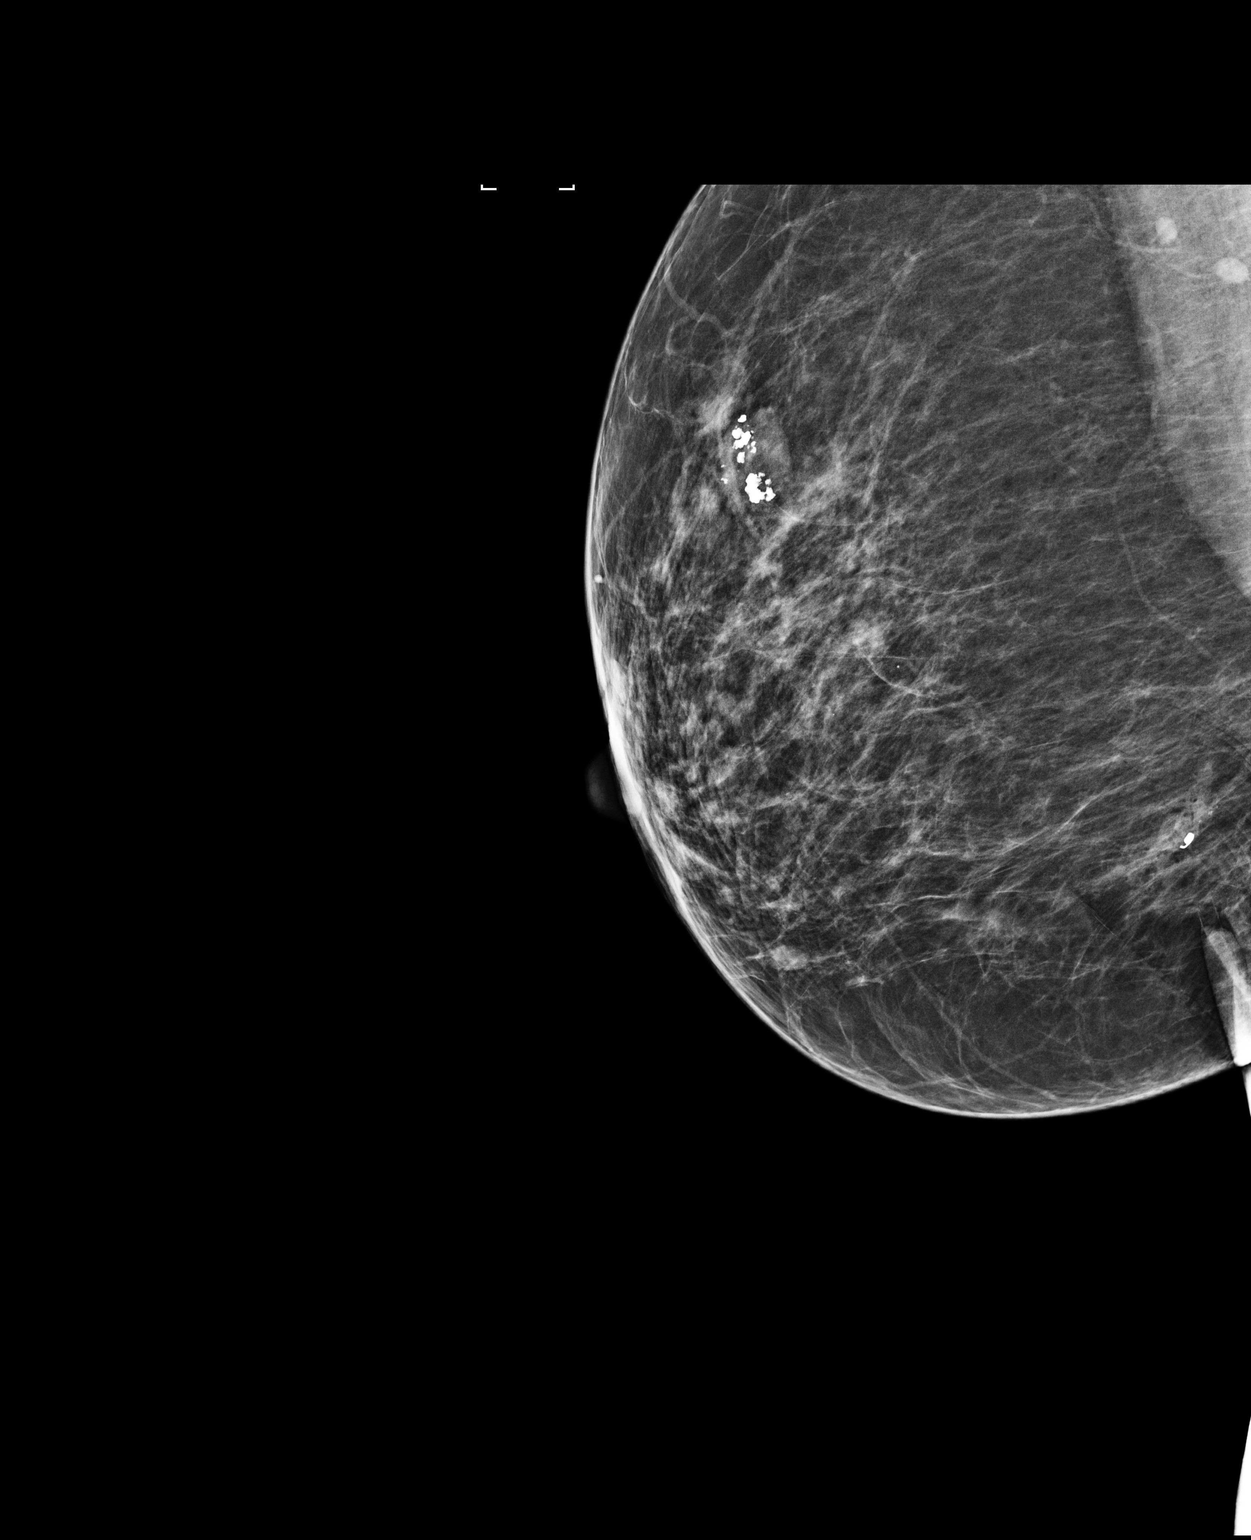

[R CC]
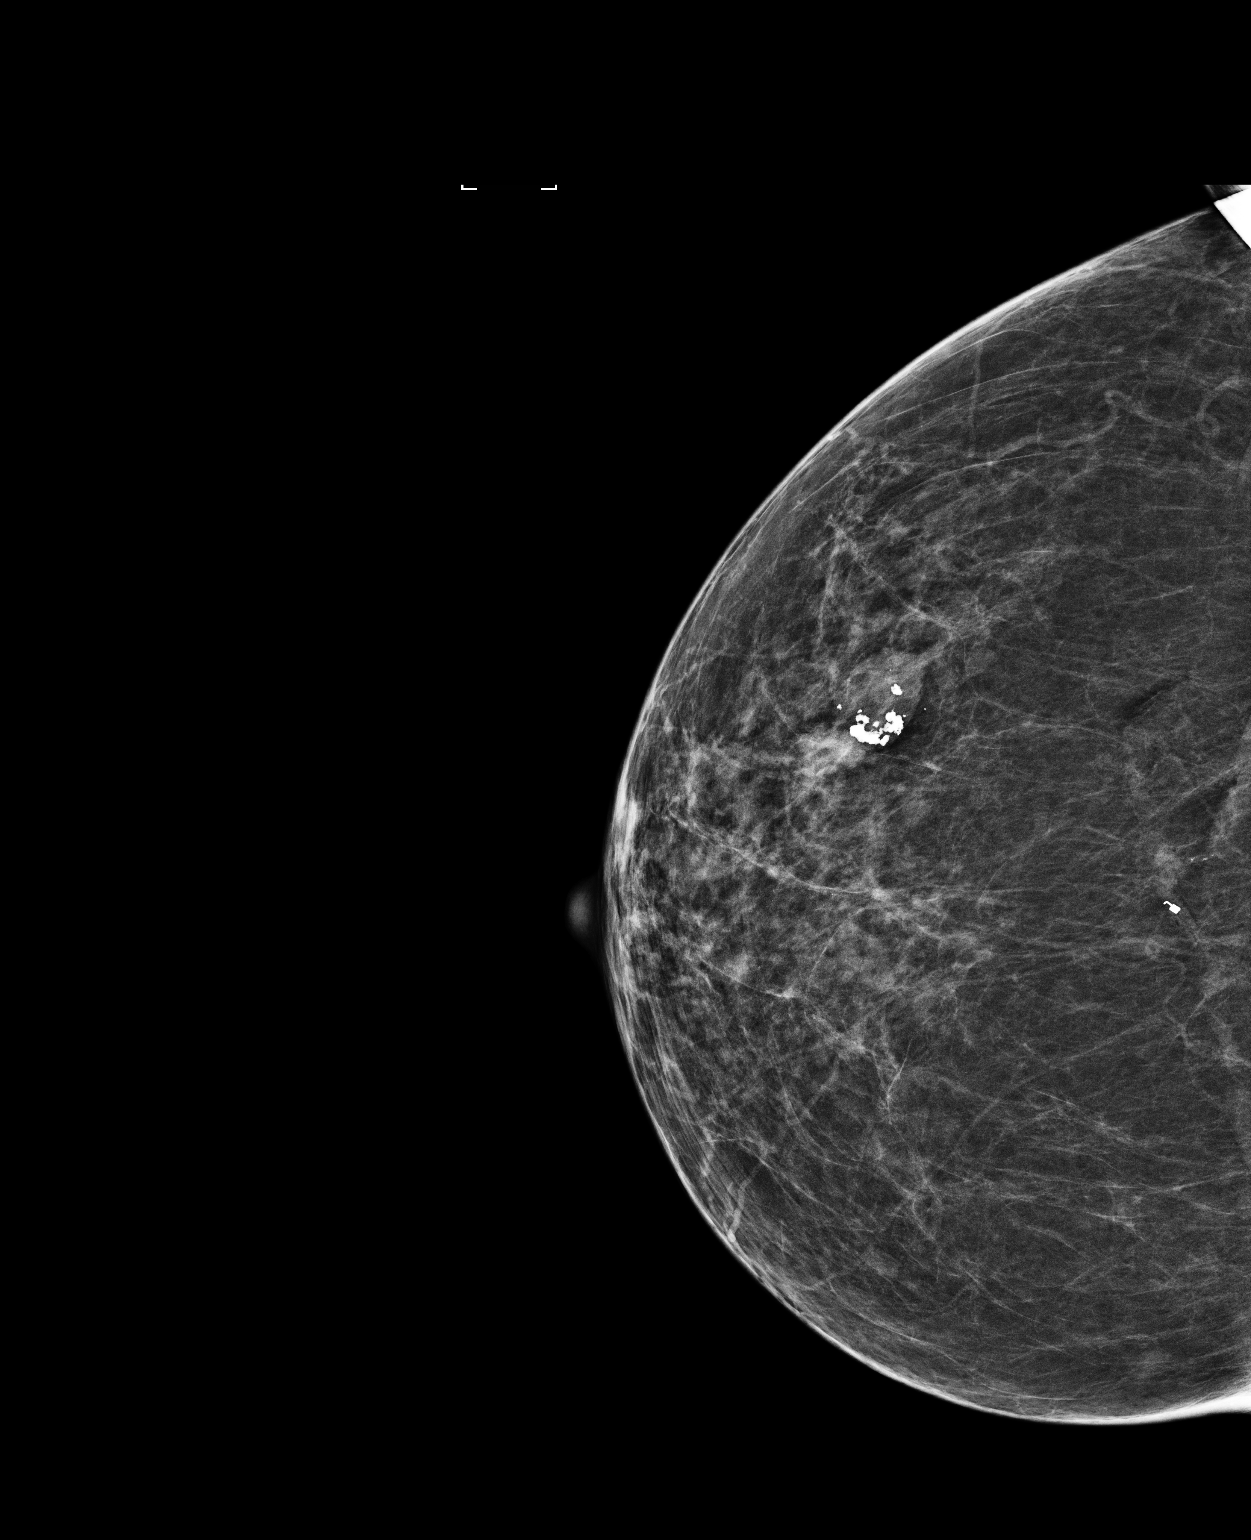

[2 of 2 positions shown; findings below may reference images not displayed]

FINDINGS: Mammographic images were obtained following stereotactic guided
biopsy of calcifications in the posterior third of the lower central
right breast. Coil shaped biopsy clip is satisfactorily positioned
in the right breast. Mammographic images were obtained following
stereotactic biopsy of calcifications in the outer and slightly
upper left breast, middle third. Coil shaped biopsy clip is
satisfactorily positioned in the left breast.
IMPRESSION: Satisfactory position of bilateral coil shaped biopsy clips.

Final Assessment: Post Procedure Mammograms for Marker Placement

## 2020-10-23 IMAGING — MG MM CLIP PLACEMENT
3 series · 3 of 3 positions shown · non-contrast
Comparison: Previous exam(s).

CLINICAL DATA: Bilateral stereotactic biopsies were performed of
bilateral breast calcifications.

EXAM:
DIAGNOSTIC BILATERAL MAMMOGRAM POST STEREOTACTIC BIOPSY

[L CC]
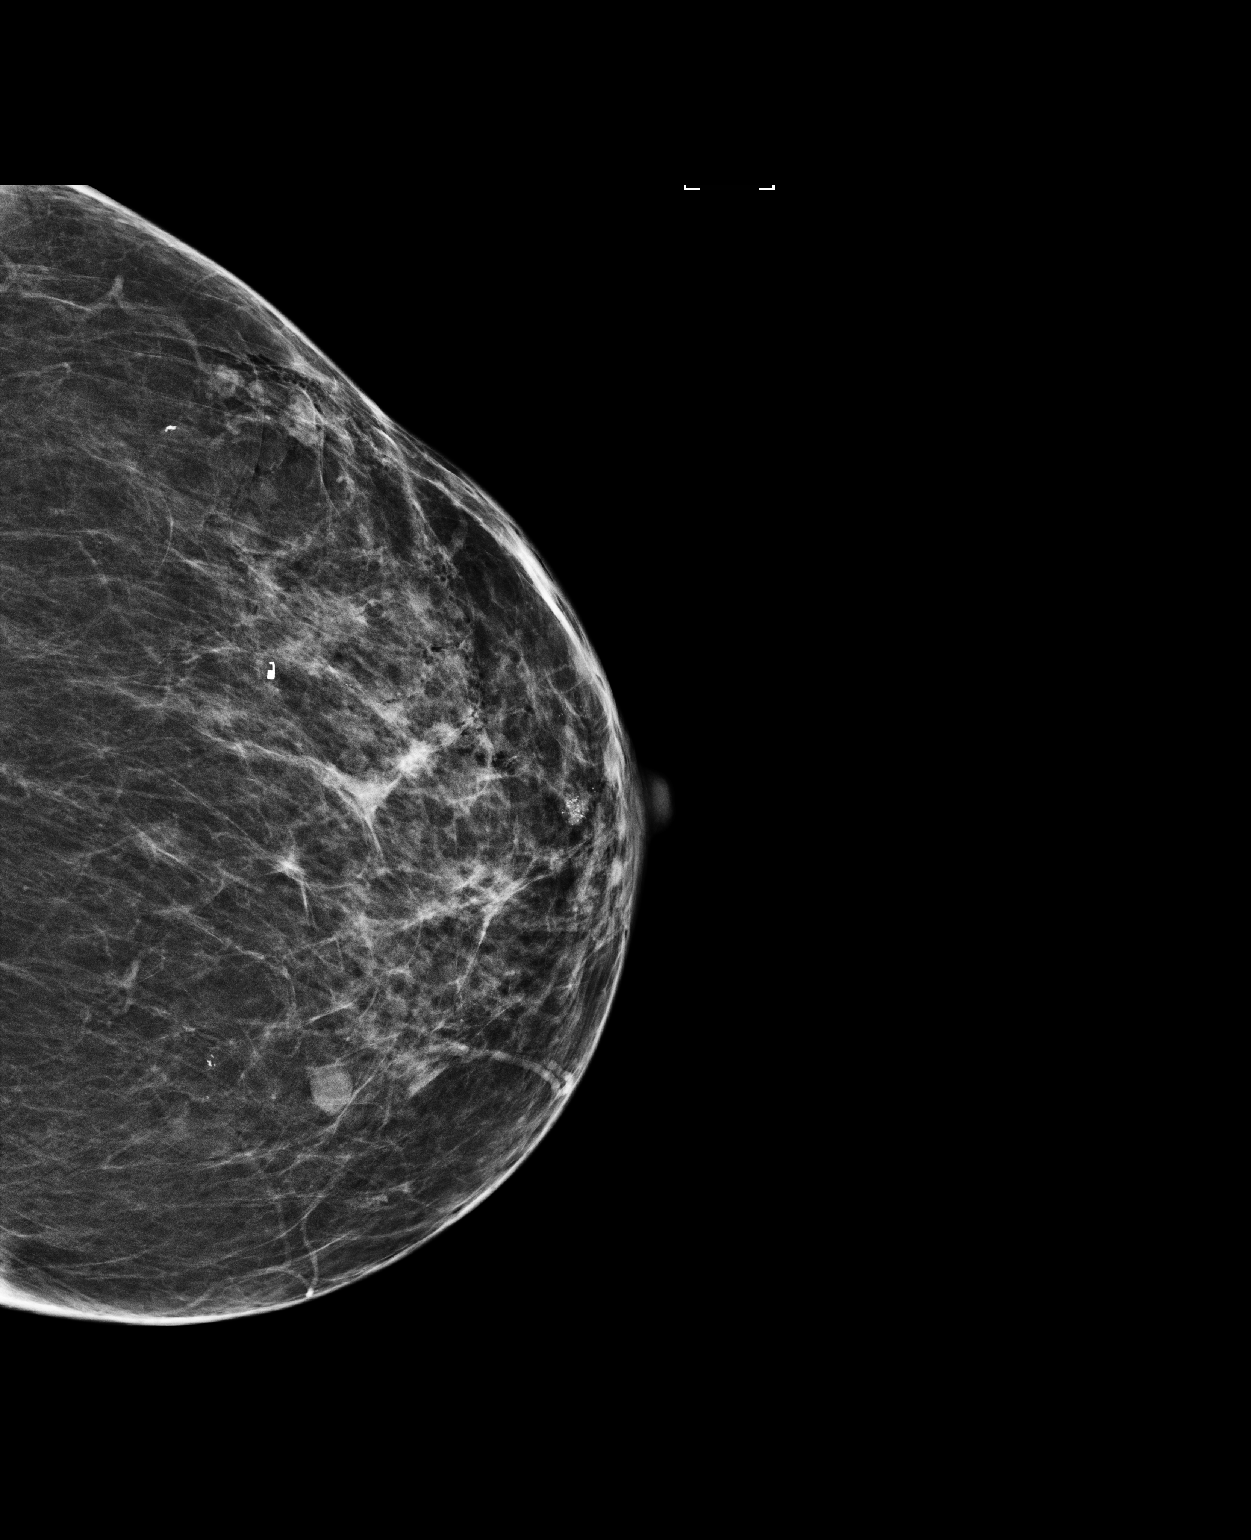

[L ML]
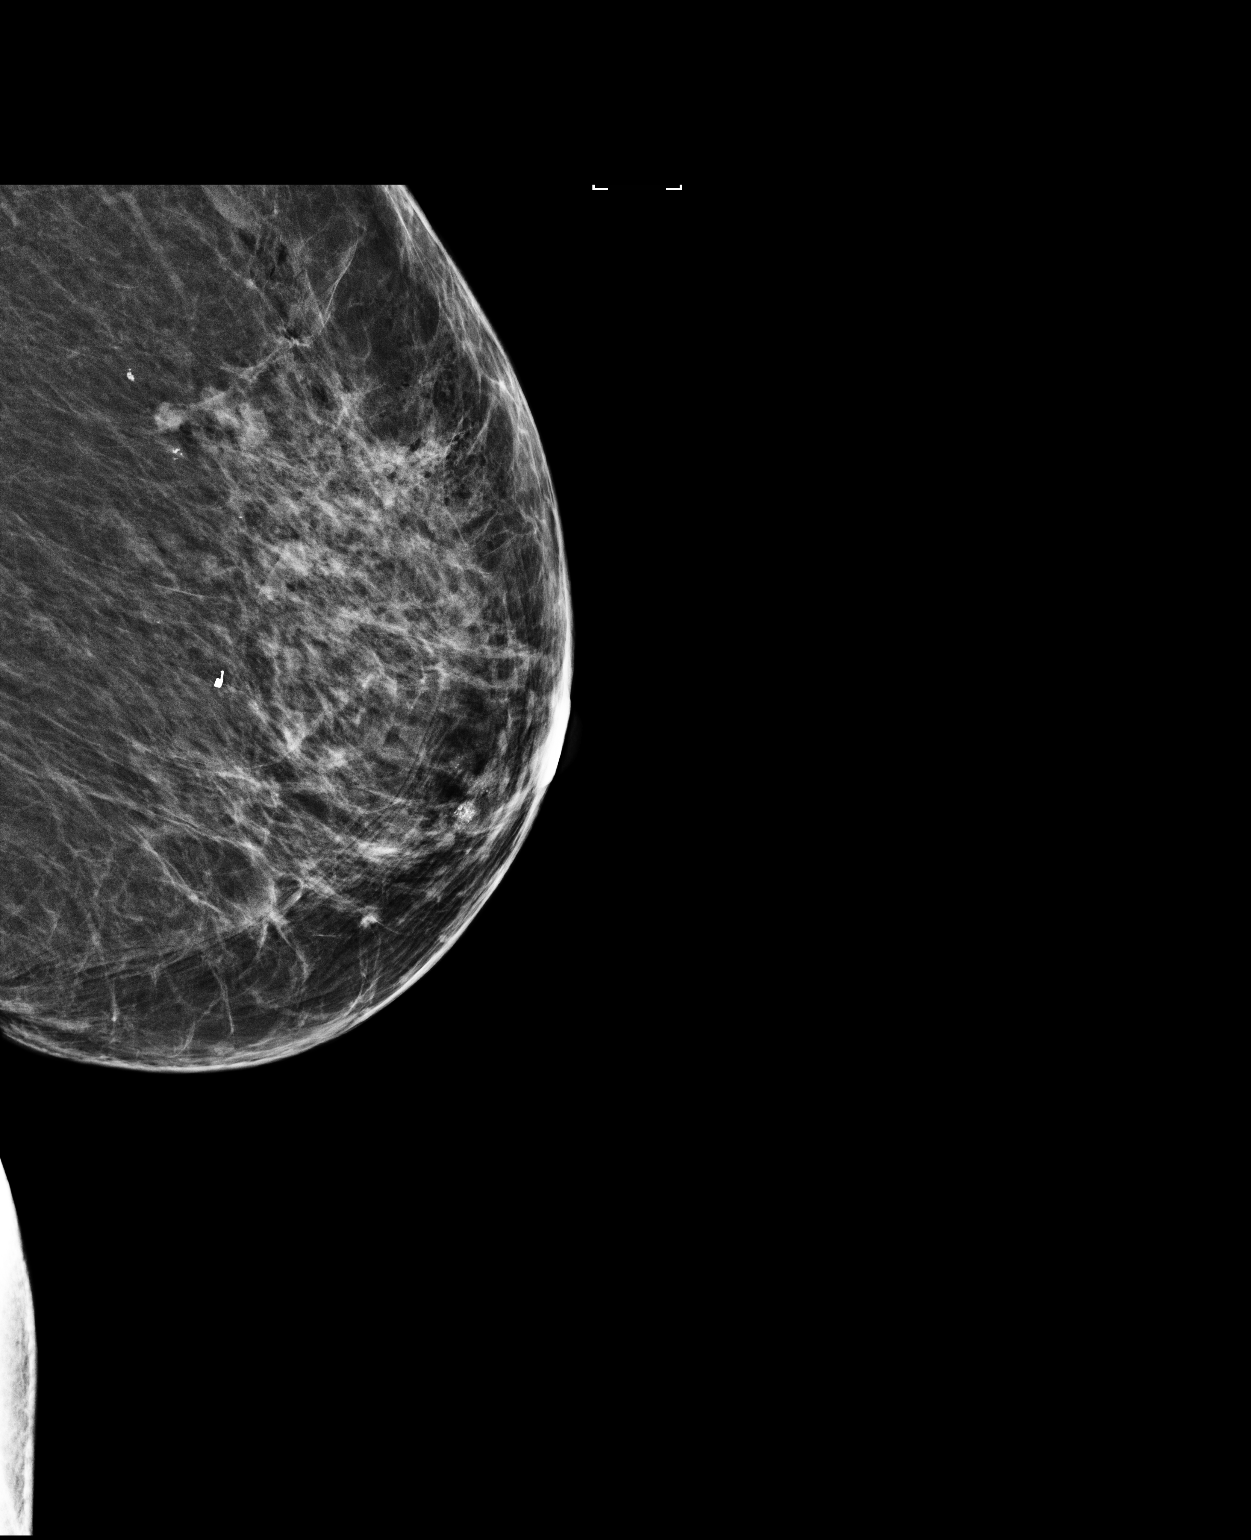

[L SPECIMEN]
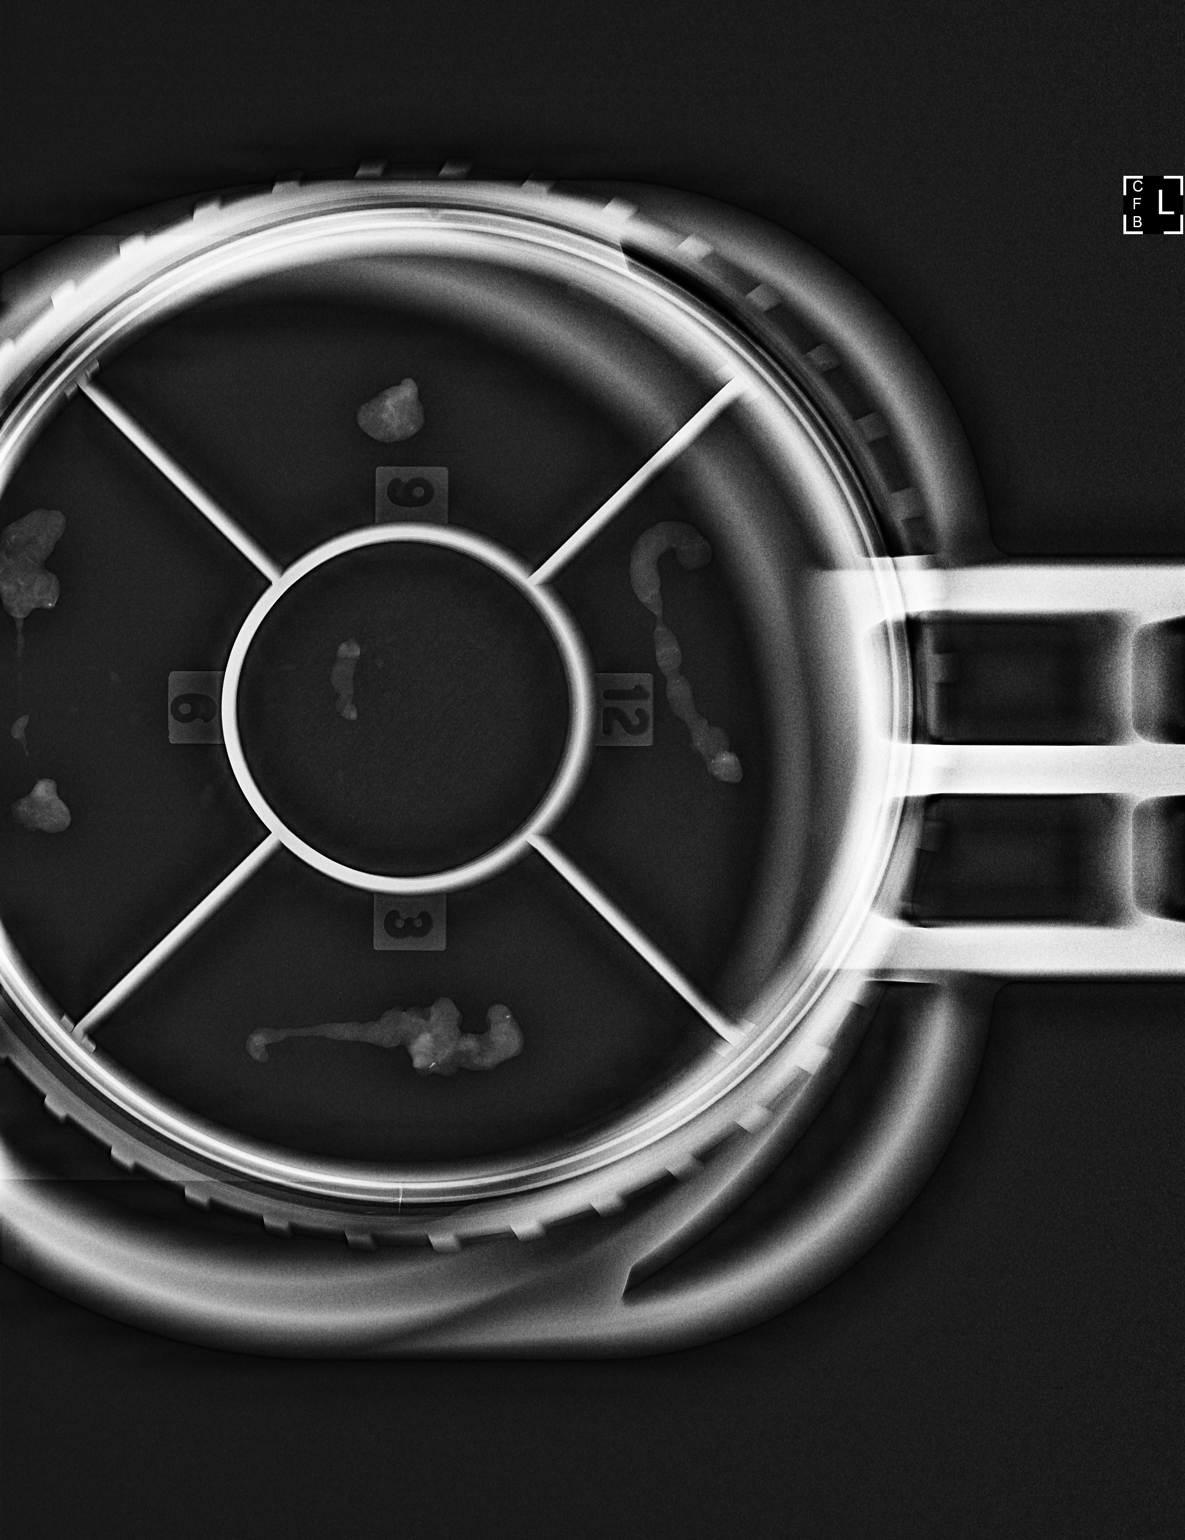

[3 of 3 positions shown; findings below may reference images not displayed]

FINDINGS: Mammographic images were obtained following stereotactic guided
biopsy of calcifications in the posterior third of the lower central
right breast. Coil shaped biopsy clip is satisfactorily positioned
in the right breast. Mammographic images were obtained following
stereotactic biopsy of calcifications in the outer and slightly
upper left breast, middle third. Coil shaped biopsy clip is
satisfactorily positioned in the left breast.
IMPRESSION: Satisfactory position of bilateral coil shaped biopsy clips.

Final Assessment: Post Procedure Mammograms for Marker Placement

## 2020-10-25 ENCOUNTER — Ambulatory Visit: Payer: Medicare Other | Attending: Internal Medicine

## 2020-10-25 DIAGNOSIS — Z23 Encounter for immunization: Secondary | ICD-10-CM

## 2020-10-25 NOTE — Progress Notes (Signed)
   Covid-19 Vaccination Clinic  Name:  NONI STONESIFER    MRN: [id number] DOB: 1956/02/16  10/25/2020  Ms. Cantrall was observed post Covid-19 immunization for 15 minutes without incident. She was provided with Vaccine Information Sheet and instruction to access the V-Safe system.   Ms. Melle was instructed to call 911 with any severe reactions post vaccine: Marland Kitchen Difficulty breathing  . Swelling of face and throat  . A fast heartbeat  . A bad rash all over body  . Dizziness and weakness   Immunizations Administered    Name Date Dose VIS Date Route   Moderna Covid-19 Booster Vaccine 10/25/2020  5:08 PM 0.25 mL 08/01/2020 Intramuscular   Manufacturer: Moderna   Lot: [id number]   [place]: [id number]

## 2020-11-04 ENCOUNTER — Other Ambulatory Visit: Payer: Self-pay | Admitting: Internal Medicine

## 2020-11-04 DIAGNOSIS — G252 Other specified forms of tremor: Secondary | ICD-10-CM

## 2020-11-04 DIAGNOSIS — I10 Essential (primary) hypertension: Secondary | ICD-10-CM

## 2020-11-22 ENCOUNTER — Ambulatory Visit: Payer: Medicare Other | Attending: Internal Medicine | Admitting: Internal Medicine

## 2020-11-22 ENCOUNTER — Encounter: Payer: Self-pay | Admitting: Internal Medicine

## 2020-11-22 ENCOUNTER — Other Ambulatory Visit: Payer: Self-pay

## 2020-11-22 DIAGNOSIS — R7303 Prediabetes: Secondary | ICD-10-CM | POA: Insufficient documentation

## 2020-11-22 DIAGNOSIS — Z9049 Acquired absence of other specified parts of digestive tract: Secondary | ICD-10-CM | POA: Diagnosis not present

## 2020-11-22 DIAGNOSIS — Z79899 Other long term (current) drug therapy: Secondary | ICD-10-CM | POA: Insufficient documentation

## 2020-11-22 DIAGNOSIS — Z791 Long term (current) use of non-steroidal anti-inflammatories (NSAID): Secondary | ICD-10-CM | POA: Diagnosis not present

## 2020-11-22 DIAGNOSIS — Z885 Allergy status to narcotic agent status: Secondary | ICD-10-CM | POA: Insufficient documentation

## 2020-11-22 DIAGNOSIS — Z23 Encounter for immunization: Secondary | ICD-10-CM | POA: Diagnosis not present

## 2020-11-22 DIAGNOSIS — F5104 Psychophysiologic insomnia: Secondary | ICD-10-CM | POA: Diagnosis not present

## 2020-11-22 DIAGNOSIS — R202 Paresthesia of skin: Secondary | ICD-10-CM | POA: Diagnosis not present

## 2020-11-22 DIAGNOSIS — G252 Other specified forms of tremor: Secondary | ICD-10-CM | POA: Diagnosis not present

## 2020-11-22 DIAGNOSIS — Z853 Personal history of malignant neoplasm of breast: Secondary | ICD-10-CM | POA: Diagnosis not present

## 2020-11-22 DIAGNOSIS — F959 Tic disorder, unspecified: Secondary | ICD-10-CM | POA: Diagnosis not present

## 2020-11-22 DIAGNOSIS — I10 Essential (primary) hypertension: Secondary | ICD-10-CM | POA: Diagnosis present

## 2020-11-22 NOTE — Progress Notes (Signed)
Pt states the tip of her fingers are hurting and she is having joint pain

## 2020-11-22 NOTE — Progress Notes (Signed)
Patient ID: Kimberly [name], female    DOB: 11-24-1955  MRN: [id number]  CC: Hypertension   Subjective: Kimberly [name] is a 65 y.o. female who presents for chronic ds management Her concerns today include:  Pt with hx ofDCIS left breast positive ER/PR treated with lumpectomy and XRT,anxiety, HTN, PreDM resolve essential tremors and insomnia.   Insomnia: Patient still complains of insomnia.  She has problems with sleep maintenance.  Usually gets in bed around 9:00 and sleeps for about 1 to 2 hours.  She states that she is up the rest of the night lying in bed with her eyes closed but not asleep.     Takes Melatonin 3 mg, Trazodone and Buspar at nights.  Tremors of head and body getting worse. Has to hold RT hand with LT in order to feed herself to prevent food from spilling off the spoon.  Still taking 1/2 Primidone daily as prescribed by neurology in August of last year.  She does not think this is helping.  She describes a funny sensation in her knees.  She states that it is something that comes and goes very quickly like a chronic ache.  Denies any stiffness.  She ambulates okay and unassisted.   Finger tips tingling sometimes but comes and goes quickly also Weight has remained stable over the past several months.  However she expressed that she is still scared to eat white carbs due to previous preDM.  Daughter is concerned about this and has told her that she needs to eat some carbs. Patient Active Problem List   Diagnosis Date Noted  . Prediabetes 04/19/2020  . Muscle cramps 04/19/2020  . Unintended weight loss 04/19/2020  . History of rheumatic fever as a child 03/17/2019  . Genetic testing 10/25/2018  . Family history of breast cancer   . Family history of lung cancer   . Ductal carcinoma in situ (DCIS) of left breast 09/07/2018  . Intention tremor 10/15/2017  . Diabetes mellitus screening 06/10/2017  . Breast nodule 03/02/2016  . Seasonal allergies 01/07/2016  . Right  shoulder pain 09/28/2015  . Chronic insomnia 07/06/2015  . Tinea pedis of both feet 07/06/2015  . Nocturia 08/25/2014  . Chronic anxiety 08/25/2014  . Heme positive stool 08/16/2014  . HTN (hypertension) 08/16/2014     Current Outpatient Medications on File Prior to Visit  Medication Sig Dispense Refill  . amLODipine (NORVASC) 10 MG tablet TAKE 1 TABLET(10 MG) BY MOUTH DAILY 90 tablet 1  . atenolol (TENORMIN) 25 MG tablet TAKE 1 TABLET(25 MG) BY MOUTH DAILY 30 tablet 0  . Blood Pressure Monitor DEVI Use as directed to check home blood pressure 2-3 times a week 1 Device 0  . busPIRone (BUSPAR) 15 MG tablet Take 1 tablet (15 mg total) by mouth 2 (two) times daily. 180 tablet 3  . hydrOXYzine (ATARAX/VISTARIL) 25 MG tablet Take 1 tablet (25 mg total) by mouth 3 (three) times daily as needed. For itching; may cause sleepiness 30 tablet 0  . ibuprofen (ADVIL,MOTRIN) 200 MG tablet Take 200 mg by mouth every 6 (six) hours as needed.    . meloxicam (MOBIC) 15 MG tablet TAKE 1 TABLET(15 MG) BY MOUTH DAILY 90 tablet 0  . Multiple Vitamin (MULTIVITAMIN WITH MINERALS) TABS tablet Take 1 tablet by mouth daily.     . permethrin (ELIMITE) 5 % cream Apply to arms and legs, let dry and wash off after 8 hours; may repeat in 1 week if needed 60  g 0  . primidone (MYSOLINE) 50 MG tablet 1/2 pill each bedtime x 2 weeks, then 1 pill nightly thereafter. 30 tablet 5  . tamoxifen (NOLVADEX) 20 MG tablet TAKE 1 TABLET(20 MG) BY MOUTH DAILY 90 tablet 3  . traZODone (DESYREL) 50 MG tablet TAKE 1 TABLET(50 MG) BY MOUTH AT BEDTIME 30 tablet 4  . triamcinolone cream (KENALOG) 0.1 % Apply 1 application topically 2 (two) times daily. 45 g 0   No current facility-administered medications on file prior to visit.    Allergies  Allergen Reactions  . Codeine Other (See Comments)    Cold sweats    Social History   Socioeconomic History  . Marital status: Single    Spouse name: Not on file  . Number of children: Not  on file  . Years of education: Not on file  . Highest education level: Not on file  Occupational History  . Not on file  Tobacco Use  . Smoking status: Never Smoker  . Smokeless tobacco: Never Used  Vaping Use  . Vaping Use: Never used  Substance and Sexual Activity  . Alcohol use: No    Alcohol/week: 0.0 standard drinks  . Drug use: No  . Sexual activity: Yes    Birth control/protection: Post-menopausal  Other Topics Concern  . Not on file  Social History Narrative  . Not on file   Social Determinants of Health   Financial Resource Strain: Not on file  Food Insecurity: Not on file  Transportation Needs: Not on file  Physical Activity: Not on file  Stress: Not on file  Social Connections: Not on file  Intimate Partner Violence: Not on file    Family History  Problem Relation Age of Onset  . Breast cancer Mother 57  . Cancer Maternal Grandmother        thinks it was braest, but not sure what type of cancer, dx >50  . Heart disease Paternal Grandfather   . Lung cancer Maternal Aunt   . Colon cancer Neg Hx     Past Surgical History:  Procedure Laterality Date  . APPENDECTOMY    . bowel obstruction    . 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 BREAST LUMPECTOMY WITH RADIOACTIVE SEED LOCALIZATION Left 10/14/2018   Procedure: BREAST LUMPECTOMY WITH RADIOACTIVE SEED LOCALIZATION X2;  Surgeon: Erroll Luna, MD;  Location: [place];  Service: General;  Laterality: Left;  . CHOLECYSTECTOMY    . COLONOSCOPY WITH PROPOFOL N/A 08/05/2018   Procedure: COLONOSCOPY WITH PROPOFOL;  Surgeon: Daneil Dolin, MD;  [facility];  Service: Endoscopy;  Laterality: N/A;  12:45pm  . TONSILLECTOMY      ROS: Review of Systems Negative except as stated above  PHYSICAL EXAM: BP 114/68   Pulse 81   Resp 16   Wt 146 lb 9.6 oz (66.5 kg)   SpO2 98%   BMI 25.97 kg/m   Wt Readings from Last 3 Encounters:  11/22/20 146 lb 9.6 oz (66.5 kg)  09/11/20 145 lb 6.4 oz (66 kg)   08/30/20 145 lb (65.8 kg)   Physical Exam  General appearance - alert, well appearing, and in no distress Mental status -slightly forgetful but answers questions appropriately. Mouth - mucous membranes moist, pharynx normal without lesions Neck - supple, no significant adenopathy Chest - clear to auscultation, no wheezes, rales or rhonchi, symmetric air entry Heart - normal rate, regular rhythm, normal S1, S2, no murmurs, rubs, clicks or gallops Neurological -patient with noted  resting force tremors of the head.  Fine tremors of the body and the right hand.  She is constantly having to rock her legs.  Her grip is 5/5 bilaterally.  Gross sensation intact.  No muscle wasting noted of the large muscle groups.  Her gait is steady.  She has vocal tremors  Extremities -no lower extremity edema.  Good range of motion of both knees.  CMP Latest Ref Rng & Units 04/19/2020 12/20/2019 10/08/2018  Glucose 65 - 99 mg/dL 97 202(H) 104(H)  BUN 8 - 27 mg/dL 17 11 15   Creatinine 0.57 - 1.00 mg/dL 1.00 0.95 0.95  Sodium 134 - 144 mmol/L 141 139 139  Potassium 3.5 - 5.2 mmol/L 4.4 4.6 4.6  Chloride 96 - 106 mmol/L 105 102 107  CO2 20 - 29 mmol/L 26 21 25   Calcium 8.7 - 10.3 mg/dL 10.0 9.9 9.9  Total Protein 6.0 - 8.5 g/dL - 7.1 7.8  Total Bilirubin 0.0 - 1.2 mg/dL - 0.2 0.4  Alkaline Phos 39 - 117 IU/L - 63 74  AST 0 - 40 IU/L - 14 19  ALT 0 - 32 IU/L - 9 15   Lipid Panel     Component Value Date/Time   CHOL 153 12/20/2019 1029   TRIG 98 12/20/2019 1029   HDL 59 12/20/2019 1029   CHOLHDL 2.6 12/20/2019 1029   CHOLHDL 2.7 11/13/2016 1102   VLDL 25 11/13/2016 1102   LDLCALC 76 12/20/2019 1029    CBC    Component Value Date/Time   WBC 6.4 04/19/2020 1432   WBC 6.1 10/08/2018 0919   RBC 4.39 04/19/2020 1432   RBC 4.84 10/08/2018 0919   HGB 13.7 04/19/2020 1432   HCT 40.2 04/19/2020 1432   PLT 396 04/19/2020 1432   MCV 92 04/19/2020 1432   MCH 31.2 04/19/2020 1432   MCH 30.4 10/08/2018 0919    MCHC 34.1 04/19/2020 1432   MCHC 32.7 10/08/2018 0919   RDW 12.2 04/19/2020 1432   LYMPHSABS 1.9 10/08/2018 0919   MONOABS 0.6 10/08/2018 0919   EOSABS 0.2 10/08/2018 0919   BASOSABS 0.1 10/08/2018 0919    ASSESSMENT AND PLAN:  1. Essential hypertension At goal.  Continue current medications and low-salt diet. - Lipid panel - Comprehensive metabolic panel  2. Intention tremor Her tremors including vocal tremors do seem to be getting worse.  We will get her back to the neurologist to see what other options may be available for her. - Ambulatory referral to Neurology  3. Tic disorder - Ambulatory referral to Neurology  4. Chronic insomnia We discussed having her stop the trazodone and trying her with low-dose of Ambien.  However patient declined stating she does not want to be on anything that can be habit-forming.  I told her that she can take it every other night but patient still declines.  She decided to stay on the trazodone and melatonin.  5. Tingling in extremities - Vitamin B12  6. Need for vaccination against Streptococcus pneumoniae using pneumococcal conjugate vaccine 13 Given today.   Patient was given the opportunity to ask questions.  Patient verbalized understanding of the plan and was able to repeat key elements of the plan.   Orders Placed This Encounter  Procedures  . Pneumococcal conjugate vaccine 13-valent IM  . Lipid panel  . Comprehensive metabolic panel  . Vitamin B12  . Ambulatory referral to Neurology     Requested Prescriptions    No prescriptions requested or ordered in this  encounter    Return in about 4 months (around 03/22/2021) for Give appt with Lurena Joiner in 1 mth for Medicare Wellness Visit.  Karle Plumber, MD, FACP

## 2020-11-22 NOTE — Patient Instructions (Signed)
Pneumococcal Conjugate Vaccine (PCV13): What You Need to Know 1. Why get vaccinated? Pneumococcal conjugate vaccine (PCV13) can prevent pneumococcal disease. Pneumococcal disease refers to any illness caused by pneumococcal bacteria. These bacteria can cause many types of illnesses, including pneumonia, which is an infection of the lungs. Pneumococcal bacteria are one of the most common causes of pneumonia. Besides pneumonia, pneumococcal bacteria can also cause:  Ear infections  Sinus infections  Meningitis (infection of the tissue covering the brain and spinal cord)  Bacteremia (infection of the blood) Anyone can get pneumococcal disease, but children under 2 years old, people with certain medical conditions, adults 65 years or older, and cigarette smokers are at the highest risk. Most pneumococcal infections are mild. However, some can result in long-term problems, such as brain damage or hearing loss. Meningitis, bacteremia, and pneumonia caused by pneumococcal disease can be fatal. 2. PCV13 PCV13 protects against 13 types of bacteria that cause pneumococcal disease. Infants and young children usually need 4 doses of pneumococcal conjugate vaccine, at ages 2, 4, 6, and 12-15 months. Older children (through age 59 months) may be vaccinated if they did not receive the recommended doses. A dose of PCV13 is also recommended for adults and children 6 years or older with certain medical conditions if they did not already receive PCV13. This vaccine may be given to healthy adults 65 years or older who did not already receive PCV13, based on discussions between the patient and health care provider. 3. Talk with your health care provider Tell your vaccination provider if the person getting the vaccine:  Has had an allergic reaction after a previous dose of PCV13, to an earlier pneumococcal conjugate vaccine known as PCV7, or to any vaccine containing diphtheria toxoid (for example, DTaP), or has  any severe, life-threatening allergies In some cases, your health care provider may decide to postpone PCV13 vaccination until a future visit. People with minor illnesses, such as a cold, may be vaccinated. People who are moderately or severely ill should usually wait until they recover before getting PCV13. Your health care provider can give you more information. 4. Risks of a vaccine reaction  Redness, swelling, pain, or tenderness where the shot is given, and fever, loss of appetite, fussiness (irritability), feeling tired, headache, and chills can happen after PCV13 vaccination. Young children may be at increased risk for seizures caused by fever after PCV13 if it is administered at the same time as inactivated influenza vaccine. Ask your health care provider for more information. People sometimes faint after medical procedures, including vaccination. Tell your provider if you feel dizzy or have vision changes or ringing in the ears. As with any medicine, there is a very remote chance of a vaccine causing a severe allergic reaction, other serious injury, or death. 5. What if there is a serious problem? An allergic reaction could occur after the vaccinated person leaves the clinic. If you see signs of a severe allergic reaction (hives, swelling of the face and throat, difficulty breathing, a fast heartbeat, dizziness, or weakness), call 9-1-1 and get the person to the nearest hospital. For other signs that concern you, call your health care provider. Adverse reactions should be reported to the Vaccine Adverse Event Reporting System (VAERS). Your health care provider will usually file this report, or you can do it yourself. Visit the VAERS website at www.vaers.hhs.gov or call 1-800-822-7967. VAERS is only for reporting reactions, and VAERS staff members do not give medical advice. 6. The National Vaccine Injury Compensation Program   The National Vaccine Injury Compensation Program (VICP) is a federal  program that was created to compensate people who may have been injured by certain vaccines. Claims regarding alleged injury or death due to vaccination have a time limit for filing, which may be as short as two years. Visit the VICP website at www.hrsa.gov/vaccinecompensation or call 1-800-338-2382 to learn about the program and about filing a claim. 7. How can I learn more?  Ask your health care provider.  Call your local or state health department.  Visit the website of the Food and Drug Administration (FDA) for vaccine package inserts and additional information at www.fda.gov/vaccines-blood-biologics/vaccines.  Contact the Centers for Disease Control and Prevention (CDC): ? Call 1-800-232-4636 (1-800-CDC-INFO) or ? Visit CDC's website at www.cdc.gov/vaccines. Vaccine Information Statement PCV13 (05/18/2020) This information is not intended to replace advice given to you by your health care provider. Make sure you discuss any questions you have with your health care provider. Document Revised: 07/05/2020 Document Reviewed: 07/05/2020 Elsevier Patient Education  2021 Elsevier Inc.   

## 2020-11-23 LAB — COMPREHENSIVE METABOLIC PANEL WITH GFR
ALT: 15 IU/L (ref 0–32)
AST: 23 IU/L (ref 0–40)
Albumin/Globulin Ratio: 1.5 (ref 1.2–2.2)
Albumin: 4.5 g/dL (ref 3.8–4.8)
Alkaline Phosphatase: 64 IU/L (ref 44–121)
BUN/Creatinine Ratio: 20 (ref 12–28)
BUN: 16 mg/dL (ref 8–27)
Bilirubin Total: <0.2 mg/dL (ref 0.0–1.2)
CO2: 16 mmol/L — ABNORMAL LOW (ref 20–29)
Calcium: 10 mg/dL (ref 8.7–10.3)
Chloride: 104 mmol/L (ref 96–106)
Creatinine, Ser: 0.79 mg/dL (ref 0.57–1.00)
GFR calc Af Amer: 90 mL/min/1.73 (ref >59)
GFR calc non Af Amer: 78 mL/min/1.73 (ref >59)
Globulin, Total: 3 g/dL (ref 1.5–4.5)
Glucose: 86 mg/dL (ref 65–99)
Potassium: 5 mmol/L (ref 3.5–5.2)
Sodium: 140 mmol/L (ref 134–144)
Total Protein: 7.5 g/dL (ref 6.0–8.5)

## 2020-11-23 LAB — LIPID PANEL
Chol/HDL Ratio: 2.1 ratio (ref 0.0–4.4)
Cholesterol, Total: 152 mg/dL (ref 100–199)
HDL: 71 mg/dL (ref >39)
LDL Chol Calc (NIH): 66 mg/dL (ref 0–99)
Triglycerides: 82 mg/dL (ref 0–149)
VLDL Cholesterol Cal: 15 mg/dL (ref 5–40)

## 2020-11-23 LAB — VITAMIN B12: Vitamin B-12: 796 pg/mL (ref 232–1245)

## 2020-11-30 ENCOUNTER — Telehealth: Payer: Self-pay

## 2020-11-30 NOTE — Telephone Encounter (Signed)
-----   Message from Ladell Pier, MD sent at 11/23/2020  8:48 AM EST ----- Let patient know that her cholesterol level normal.  Kidney and liver function tests normal.  Vitamin B12 level normal.

## 2020-11-30 NOTE — Telephone Encounter (Signed)
Patient name and DOB has been verified Patient was informed of lab results. Patient had no questions.  

## 2020-12-04 ENCOUNTER — Other Ambulatory Visit: Payer: Self-pay | Admitting: Neurology

## 2020-12-04 ENCOUNTER — Other Ambulatory Visit: Payer: Self-pay | Admitting: Internal Medicine

## 2020-12-04 DIAGNOSIS — I10 Essential (primary) hypertension: Secondary | ICD-10-CM

## 2020-12-04 DIAGNOSIS — G252 Other specified forms of tremor: Secondary | ICD-10-CM

## 2020-12-20 ENCOUNTER — Ambulatory Visit: Payer: Medicare Other | Admitting: Pharmacist

## 2020-12-25 ENCOUNTER — Other Ambulatory Visit: Payer: Self-pay | Admitting: Internal Medicine

## 2020-12-25 DIAGNOSIS — F99 Mental disorder, not otherwise specified: Secondary | ICD-10-CM

## 2020-12-25 DIAGNOSIS — F5105 Insomnia due to other mental disorder: Secondary | ICD-10-CM

## 2021-01-02 ENCOUNTER — Ambulatory Visit: Payer: Medicare Other | Attending: Internal Medicine | Admitting: Pharmacist

## 2021-01-02 ENCOUNTER — Encounter: Payer: Self-pay | Admitting: Pharmacist

## 2021-01-02 ENCOUNTER — Other Ambulatory Visit: Payer: Self-pay

## 2021-01-02 VITALS — BP 122/80 | HR 85 | Temp 98.3°F | Ht 64.0 in | Wt 147.4 lb

## 2021-01-02 DIAGNOSIS — Z0000 Encounter for general adult medical examination without abnormal findings: Secondary | ICD-10-CM

## 2021-01-02 NOTE — Progress Notes (Signed)
Subjective:   Kimberly [name] is a 65 y.o. female who presents for Medicare Annual (Subsequent) preventive examination.        Objective:    Today's Vitals   01/02/21 1524 01/02/21 1525  BP: 122/80   Pulse: 85   Temp: 98.3 F (36.8 C)   Weight: 147 lb 6.4 oz (66.9 kg)   Height: 5\' 4"  (1.626 m)   PainSc: 0-No pain 0-No pain   Body mass index is 25.3 kg/m.  Advanced Directives 01/02/2021 06/05/2020 11/02/2018 10/08/2018 08/05/2018 08/03/2018 07/03/2017  Does Patient Have a Medical Advance Directive? No No No No No No No  Would patient like information on creating a medical advance directive? No - Patient declined No - Patient declined No - Patient declined No - Patient declined No - Patient declined No - Patient declined -    Current Medications (verified) Outpatient Encounter Medications as of 01/02/2021  Medication Sig  . amLODipine (NORVASC) 10 MG tablet TAKE 1 TABLET(10 MG) BY MOUTH DAILY  . atenolol (TENORMIN) 25 MG tablet TAKE 1 TABLET(25 MG) BY MOUTH DAILY  . Blood Pressure Monitor DEVI Use as directed to check home blood pressure 2-3 times a week  . busPIRone (BUSPAR) 15 MG tablet Take 1 tablet (15 mg total) by mouth 2 (two) times daily.  Marland Kitchen ibuprofen (ADVIL,MOTRIN) 200 MG tablet Take 200 mg by mouth every 6 (six) hours as needed.  . Multiple Vitamin (MULTIVITAMIN WITH MINERALS) TABS tablet Take 1 tablet by mouth daily.   . primidone (MYSOLINE) 50 MG tablet TAKE 1/2 TABLET BY MOUTH EVERY NIGHT AT BEDTIME  . tamoxifen (NOLVADEX) 20 MG tablet TAKE 1 TABLET(20 MG) BY MOUTH DAILY  . traZODone (DESYREL) 50 MG tablet TAKE 1 TABLET(50 MG) BY MOUTH AT BEDTIME  . [DISCONTINUED] hydrOXYzine (ATARAX/VISTARIL) 25 MG tablet Take 1 tablet (25 mg total) by mouth 3 (three) times daily as needed. For itching; may cause sleepiness  . [DISCONTINUED] meloxicam (MOBIC) 15 MG tablet TAKE 1 TABLET(15 MG) BY MOUTH DAILY  . [DISCONTINUED] permethrin (ELIMITE) 5 % cream Apply to arms and legs,  let dry and wash off after 8 hours; may repeat in 1 week if needed  . [DISCONTINUED] triamcinolone cream (KENALOG) 0.1 % Apply 1 application topically 2 (two) times daily.   No facility-administered encounter medications on file as of 01/02/2021.    Allergies (verified) Codeine   History: Past Medical History:  Diagnosis Date  . Anxiety 1995  . Breast cancer ([place]) 2020   Left Breast Cancer  . Cancer [facility])    breast left  . Depression 1995  . Family history of breast cancer   . Family history of lung cancer   . History of kidney stones   . History of rheumatic fever as a child   . Hypertension Dx Dec 2015  . Panic attack 1995  . Personal history of radiation therapy 2020   Left Breast Cancer  . Tremor    Past Surgical History:  Procedure Laterality Date  . APPENDECTOMY    . bowel obstruction    . 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 BREAST LUMPECTOMY WITH RADIOACTIVE SEED LOCALIZATION Left 10/14/2018   Procedure: BREAST LUMPECTOMY WITH RADIOACTIVE SEED LOCALIZATION X2;  Surgeon: Erroll Luna, MD;  Location: [place];  Service: General;  Laterality: Left;  . CHOLECYSTECTOMY    . COLONOSCOPY WITH PROPOFOL N/A 08/05/2018   Procedure: COLONOSCOPY WITH PROPOFOL;  Surgeon: Daneil Dolin, MD;  [facility];  Service: Endoscopy;  Laterality: N/A;  12:45pm  . TONSILLECTOMY     Family History  Problem Relation Age of Onset  . Breast cancer Mother 82  . Cancer Maternal Grandmother        thinks it was braest, but not sure what type of cancer, dx >50  . Heart disease Paternal Grandfather   . Lung cancer Maternal Aunt   . Colon cancer Neg Hx    Social History   Socioeconomic History  . Marital status: Single    Spouse name: Not on file  . Number of children: Not on file  . Years of education: Not on file  . Highest education level: Not on file  Occupational History  . Not on file  Tobacco Use  . Smoking status: Never Smoker  . Smokeless tobacco: Never  Used  Vaping Use  . Vaping Use: Never used  Substance and Sexual Activity  . Alcohol use: No    Alcohol/week: 0.0 standard drinks  . Drug use: No  . Sexual activity: Yes    Birth control/protection: Post-menopausal  Other Topics Concern  . Not on file  Social History Narrative  . Not on file   Social Determinants of Health   Financial Resource Strain: Low Risk   . Difficulty of Paying Living Expenses: Not hard at all  Food Insecurity: No Food Insecurity  . Worried About Charity fundraiser in the Last Year: Never true  . Ran Out of Food in the Last Year: Never true  Transportation Needs: No Transportation Needs  . Lack of Transportation (Medical): No  . Lack of Transportation (Non-Medical): No  Physical Activity: Inactive  . Days of Exercise per Week: 0 days  . Minutes of Exercise per Session: 0 min  Stress: Stress Concern Present  . Feeling of Stress : Rather much  Social Connections: Not on file    Tobacco Counseling Counseling given: Not Answered   Clinical Intake:  Pre-visit preparation completed: No  Pain : No/denies pain Pain Score: 0-No pain     Diabetes: No  How often do you need to have someone help you when you read instructions, pamphlets, or other written materials from your doctor or pharmacy?: 1 - Never  Diabetic? No  Interpreter Needed?: No      Activities of Daily Living In your present state of health, do you have any difficulty performing the following activities: 01/02/2021 08/24/2020  Hearing? N N  Vision? N N  Difficulty concentrating or making decisions? N N  Walking or climbing stairs? N N  Dressing or bathing? N N  Doing errands, shopping? N N  Preparing Food and eating ? N -  Using the Toilet? N -  In the past six months, have you accidently leaked urine? N -  Do you have problems with loss of bowel control? N -  Managing your Medications? N -  Managing your Finances? N -  Housekeeping or managing your Housekeeping? N -   Some recent data might be hidden    Patient Care Team: Ladell Pier, MD as PCP - General (Internal Medicine) Gala Romney, Cristopher Estimable, MD as Consulting Physician (Gastroenterology) Erroll Luna, MD as Consulting Physician (General Surgery) Raegan Sipp Lose, MD as Consulting Physician (Hematology and Oncology) Kyung Rudd, MD as Consulting Physician (Radiation Oncology)  Indicate any recent Medical Services you may have received from other than [facility] providers in the past year (date may be approximate).     Assessment:   This is a routine  wellness examination for [place].  Hearing/Vision screen No exam data present  Dietary issues and exercise activities discussed: Current Exercise Habits: The patient does not participate in regular exercise at present  Goals    . Blood Pressure < 140/90    . Exercise 3x per week (30 min per time)      Depression Screen PHQ 2/9 Scores 01/02/2021 11/22/2020 08/24/2020 07/20/2020 06/05/2020 04/19/2020 03/17/2019  PHQ - 2 Score 5 5 0 0 0 1 1  PHQ- 9 Score 9 9 - - - - 2    Fall Risk Fall Risk  01/02/2021 11/22/2020 08/24/2020 07/20/2020 06/05/2020  Falls in the past year? 0 0 0 0 0  Number falls in past yr: 0 0 0 0 -  Injury with Fall? 0 0 0 0 -  Follow up Falls evaluation completed;Education provided;Falls prevention discussed - - - -    FALL RISK PREVENTION PERTAINING TO THE HOME:  Any stairs in or around the home? No  If so, are there any without handrails? No  Home free of loose throw rugs in walkways, pet beds, electrical cords, etc? Yes  Adequate lighting in your home to reduce risk of falls? Yes   ASSISTIVE DEVICES UTILIZED TO PREVENT FALLS:  Life alert? No  Use of a cane, walker or w/c? No  Grab bars in the bathroom? No  Shower chair or bench in shower? No  Elevated toilet seat or a handicapped toilet? No   TIMED UP AND GO:  Was the test performed? Yes .  Length of time to ambulate 10 feet: 2 sec.   Gait steady and fast without use of  assistive device  Cognitive Function: MMSE - Mini Mental State Exam 01/02/2021  Orientation to time 5  Orientation to Place 5  Registration 3  Attention/ Calculation 5  Recall 3  Language- name 2 objects 2  Language- repeat 1  Language- follow 3 step command 3  Language- read & follow direction 1  Write a sentence 1  Copy design 1  Total score 30        Immunizations Immunization History  Administered Date(s) Administered  . Influenza,inj,Quad PF,6+ Mos 09/21/2014, 07/07/2016, 06/11/2017  . Influenza-Unspecified 06/28/2019, 07/03/2020  . Moderna SARS-COV2 Booster Vaccination 10/25/2020  . Moderna Sars-Covid-2 Vaccination 11/28/2019, 12/27/2019  . Pneumococcal Conjugate-13 11/22/2020  . Pneumococcal Polysaccharide-23 09/22/2014  . Tdap 08/11/2016    TDAP status: Up to date  Flu Vaccine status: Up to date  Pneumococcal vaccine status: Up to date  Covid-19 vaccine status: Completed vaccines  Qualifies for Shingles Vaccine? Yes   Zostavax completed No   Shingrix Completed?: No.    Education has been provided regarding the importance of this vaccine. Patient has been advised to call insurance company to determine out of pocket expense if they have not yet received this vaccine. Advised may also receive vaccine at local pharmacy or Health Dept. Verbalized acceptance and understanding.  Screening Tests Health Maintenance  Topic Date Due  . COVID-19 Vaccine (4 - Booster for Moderna series) 04/24/2021  . PNA vac Low Risk Adult (2 of 2 - PPSV23) 11/22/2021  . MAMMOGRAM  09/14/2022  . TETANUS/TDAP  08/11/2026  . COLONOSCOPY (Pts 45-41yrs Insurance coverage will need to be confirmed)  08/05/2028  . INFLUENZA VACCINE  Completed  . DEXA SCAN  Completed  . Hepatitis C Screening  Completed  . HPV VACCINES  Aged Out    Health Maintenance  There are no preventive care reminders to display for this  patient.  Colorectal cancer screening: Type of screening: Colonoscopy.  Completed 08/05/2018. Repeat every 10 years  Mammogram status: Completed 09/14/2020. Repeat every year  Bone Density status: Completed 09/14/2019. Results reflect: Bone density results: OSTEOPOROSIS. Repeat every 2 years.  Additional Screening:  Hepatitis C Screening: does qualify; Completed 01/07/16  Vision Screening: Recommended annual ophthalmology exams for early detection of glaucoma and other disorders of the eye. Is the patient up to date with their annual eye exam?  No  Who is the provider or what is the name of the office in which the patient attends annual eye exams? None If pt is not established with a provider, would they like to be referred to a provider to establish care? Yes .   Dental Screening: Recommended annual dental exams for proper oral hygiene  Community Resource Referral / Chronic Care Management: CRR required this visit?  Yes   CCM required this visit?  Yes      Plan:     I have personally reviewed and noted the following in the patient's chart:   . Medical and social history . Use of alcohol, tobacco or illicit drugs  . Current medications and supplements . Functional ability and status . Nutritional status . Physical activity . Advanced directives . List of other physicians . Hospitalizations, surgeries, and ER visits in previous 12 months . Vitals . Screenings to include cognitive, depression, and falls . Referrals and appointments  In addition, I have reviewed and discussed with patient certain preventive protocols, quality metrics, and best practice recommendations. A written personalized care plan for preventive services as well as general preventive health recommendations were provided to patient.  Tresa Endo, RPH-CPP   01/02/2021

## 2021-01-06 ENCOUNTER — Other Ambulatory Visit: Payer: Self-pay | Admitting: Podiatry

## 2021-01-23 ENCOUNTER — Ambulatory Visit (INDEPENDENT_AMBULATORY_CARE_PROVIDER_SITE_OTHER): Payer: Medicare Other | Admitting: Neurology

## 2021-01-23 ENCOUNTER — Encounter: Payer: Self-pay | Admitting: Neurology

## 2021-01-23 ENCOUNTER — Other Ambulatory Visit: Payer: Self-pay

## 2021-01-23 VITALS — BP 112/82 | HR 73 | Ht 64.0 in | Wt 150.0 lb

## 2021-01-23 DIAGNOSIS — G250 Essential tremor: Secondary | ICD-10-CM | POA: Diagnosis not present

## 2021-01-23 NOTE — Patient Instructions (Signed)
Verbal instructions given, patient is agreeable to follow-up in 6 months to see the nurse practitioner.  She will check with her oncologist, Dr. Lindi Adie if she can increase her Mysoline to a full pill from his standpoint.  She has an appointment with him not until November.

## 2021-01-23 NOTE — Progress Notes (Signed)
Subjective:    Patient ID: Kimberly [name] is a 65 y.o. female.  HPI     Interim history:   Kimberly [name] is a 65 year old right-handed woman with an underlying medical history of hypertension, rheumatic fever as a child, kidney stones, breast cancer, depression, and anxiety, who Presents for follow-up consultation of her tremors.  The patient is unaccompanied today.  She is again re-referred by her primary care physician.  I last saw her on 05/30/2020, at which time she was advised to start low-dose Mysoline so long as her oncologist was in agreement that it would not interfere with her cancer treatment, particularly her tamoxifen.  She saw Debbora Presto, NP in the interim on 08/30/2020, at which time she felt that her tremor was not much better, oncology approved that she would take only half a pill daily.  We had originally planned to increase it to a full pill daily.  Surgical management was discussed, she was on atenolol 25 mg daily.  She was encouraged to talk to oncology about the possibility of increasing the Mysoline to 50 mg.  She was also encouraged to talk to her primary care physician about anxiety management as anxiety would worsen her tremor.  She was advised to use weighted utensils.  She was advised to follow-up in 6 months.  Today, 01/23/2021: She reports that her tremor has become worse, she did not check with her oncologist whether going up to the full pill with the Mysoline would be possible given her tamoxifen.  She would be agreeable to calling his office, her appointment is not until November of this year.  She has been on BuSpar for anxiety.  Previously:   05/30/20: (She) reports worsening tremors affecting her hands, particularly her right hand and also her neck and head area.  She has not had any recent falls.  She does have some issues with her balance however.  She tries to hydrate well with water but sometimes forgets to drink water.  She does not drink any caffeine or  alcohol on a regular basis.   I have evaluated her a little over 2 years ago for hand tremors, at which time her tremors were mild.  She had a nonfocal neurologic exam.  I suggested we proceed with a brain MRI to rule out a structural cause.  She had a brain MRI with and without contrast on 03/15/2018 and I reviewed the results: IMPRESSION: This MRI of the brain with and without contrast shows the following: 1.   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  It is next to the head of the right caudate but does not appear to involve the basal ganglia. 2.   Scattered foci in the subcortical and deep white matter of the hemispheres consistent with chronic microvascular ischemic changes.  None of the foci appear to be acute. 3.   There is asymmetry of the pituitary gland.  The gland appears to enhance homogenously.  The pituitary stalk is midline.  This could be an incidental finding or due to a microadenoma.  If clinically indicated, consider dedicated pituitary imaging. 4.   Chronic ethmoid and right frontal sinusitis. 5.   There are no acute findings.   We called her with her test results and she was advised to proceed with a dedicated pituitary gland MRI.  She was agreeable to this.  She had a repeat brain MRI with special attention to the pituitary gland on 03/31/2018 with  and without contrast and I reviewed the results:  IMPRESSION:    MRI brain and pituitary (with and without) demonstrating: 1.  Mild scattered periventricular subcortical foci of nonspecific gliosis. 2.  Dedicated pituitary gland imaging appears normal enhancing pituitary gland tissue.  No evidence of underlying pituitary mass. 3.  Incidental right frontal developmental venous anomaly.   She reports no other new symptoms, reports no pain no weakness, no facial symptoms otherwise.  She does have some voice tremor.  She reports difficulty with sleep, she takes trazodone for sleep but is  often still awake at night and tosses and turns.  She is not sure that the tremor is affecting her sleep.    02/23/18: 65 year old right-handed woman with an underlying medical history of anxiety, hypertension, seasonal allergies, insomnia, and overweight state, who reports a bilateral hand tremor for the past 1-2 years. I reviewed your office note from 12/03/2017. She has been on atenolol, 25 mg once daily, she has not noticed much in the way of tremor benefit from it. She admits that she does not drink enough water, sometimes only 1 or 2 cups per day, does not drink soda daily, and does not like caffeine in the form of tea or coffee. Sometimes she drinks some juice.  She is single and lives with her daughter, she has 1 grown daughter. She is a nonsmoker and does not utilize alcohol. She had routine blood work last year in February which I reviewed, TSH was normal at the time. She has a follow-up appointment next week. She is not familiar with her father side of her family history. She has 1 brother and 1 sister, neither one with tremors. Her mother did not have a tremor, she died at 64 from cancer. 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   Her Past Medical History Is Significant For: Past Medical History:  Diagnosis Date  . Anxiety 1995  . Breast cancer ([place]) 2020   Left Breast Cancer  . Cancer [facility])    breast left  . Depression 1995  . Family history of breast cancer   . Family history of lung cancer   . History of kidney stones   . History of rheumatic fever as a child   . Hypertension Dx Dec 2015  . Panic attack 1995  . Personal history of radiation therapy 2020   Left Breast Cancer  . Tremor     Her Past Surgical History Is Significant For: Past Surgical History:  Procedure Laterality Date  . APPENDECTOMY    . bowel obstruction    . 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 BREAST LUMPECTOMY WITH RADIOACTIVE SEED LOCALIZATION Left 10/14/2018   Procedure: BREAST LUMPECTOMY WITH  RADIOACTIVE SEED LOCALIZATION X2;  Surgeon: Erroll Luna, MD;  Location: [facility];  Service: General;  Laterality: Left;  . CHOLECYSTECTOMY    . COLONOSCOPY WITH PROPOFOL N/A 08/05/2018   Procedure: COLONOSCOPY WITH PROPOFOL;  Surgeon: Daneil Dolin, MD;  [facility];  Service: Endoscopy;  Laterality: N/A;  12:45pm  . TONSILLECTOMY      Her Family History Is Significant For: Family History  Problem Relation Age of Onset  . Breast cancer Mother 6  . Cancer Maternal Grandmother        thinks it was braest, but not sure what type of cancer, dx >50  . Heart disease Paternal Grandfather   . Lung cancer Maternal Aunt   . Colon cancer Neg Hx     Her Social  History Is Significant For: Social History   Socioeconomic History  . Marital status: Single    Spouse name: Not on file  . Number of children: Not on file  . Years of education: Not on file  . Highest education level: Not on file  Occupational History  . Not on file  Tobacco Use  . Smoking status: Never Smoker  . Smokeless tobacco: Never Used  Vaping Use  . Vaping Use: Never used  Substance and Sexual Activity  . Alcohol use: No    Alcohol/week: 0.0 standard drinks  . Drug use: No  . Sexual activity: Yes    Birth control/protection: Post-menopausal  Other Topics Concern  . Not on file  Social History Narrative  . Not on file   Social Determinants of Health   Financial Resource Strain: Low Risk   . Difficulty of Paying Living Expenses: Not hard at all  Food Insecurity: No Food Insecurity  . Worried About Charity fundraiser in the Last Year: Never true  . Ran Out of Food in the Last Year: Never true  Transportation Needs: No Transportation Needs  . Lack of Transportation (Medical): No  . Lack of Transportation (Non-Medical): No  Physical Activity: Inactive  . Days of Exercise per Week: 0 days  . Minutes of Exercise per Session: 0 min  Stress: Stress Concern Present  . Feeling of Stress : Rather  much  Social Connections: Not on file    Her Allergies Are:  Allergies  Allergen Reactions  . Codeine Other (See Comments)    Cold sweats  :   Her Current Medications Are:  Outpatient Encounter Medications as of 01/23/2021  Medication Sig  . amLODipine (NORVASC) 10 MG tablet TAKE 1 TABLET(10 MG) BY MOUTH DAILY  . atenolol (TENORMIN) 25 MG tablet TAKE 1 TABLET(25 MG) BY MOUTH DAILY  . Blood Pressure Monitor DEVI Use as directed to check home blood pressure 2-3 times a week  . busPIRone (BUSPAR) 15 MG tablet Take 1 tablet (15 mg total) by mouth 2 (two) times daily.  Marland Kitchen ibuprofen (ADVIL,MOTRIN) 200 MG tablet Take 200 mg by mouth every 6 (six) hours as needed.  . Multiple Vitamin (MULTIVITAMIN WITH MINERALS) TABS tablet Take 1 tablet by mouth daily.   . primidone (MYSOLINE) 50 MG tablet TAKE 1/2 TABLET BY MOUTH EVERY NIGHT AT BEDTIME  . tamoxifen (NOLVADEX) 20 MG tablet TAKE 1 TABLET(20 MG) BY MOUTH DAILY  . traZODone (DESYREL) 50 MG tablet TAKE 1 TABLET(50 MG) BY MOUTH AT BEDTIME   No facility-administered encounter medications on file as of 01/23/2021.  :  Review of Systems:  Out of a complete 14 point review of systems, all are reviewed and negative with the exception of these symptoms as listed below: Review of Systems  Neurological:       Here for f/u on tremors. Reports increased tremors sine Nov f/u. Pt reports she did not f/u with oncologist as recommended last visit. Pt reports she forgot to ask about dosage.    Objective:  Neurological Exam  Physical Exam Physical Examination:   Vitals:   01/23/21 1058  BP: 112/82  Pulse: 73  SpO2: 95%    General Examination: The patient is a very pleasant 65 y.o. female in no acute distress. She appears well-developed and well-nourished and well groomed.   HEENT:Normocephalic, atraumatic, pupils are equal, round and reactive to light, tracking is fairly well-preserved, hearing is grossly intact, she has corrective eyeglasses in  place.  She has  a mild voice tremor, it seems stable.  She has a mild to moderate head tremor, could be slightly worse compared to last examination. Airway examination reveals moderate mouth dryness, adequate dental hygiene with several missing teeth, tongue protrudes centrally in palate elevates symmetrically.  Neck is supple with full range of motion.    Chest:Clear to auscultation without wheezing, rhonchi or crackles noted.  Heart:S1+S2+0, regular and normal without murmurs, rubs or gallops noted.   Abdomen:Soft, non-tender and non-distended.  Extremities:There isnoobvious edema.    Skin: Warm and dry without trophic changes noted.  Musculoskeletal: exam reveals no obvious joint deformities.   Neurologically:  Mental status: The patient is awake, alert and oriented in all 4 spheres.Herimmediate and remote memory, attention, language skills and fund of knowledge are appropriate. There is no evidence of aphasia, agnosia, apraxia or anomia. Speech is as described above.  Thought process is linear. Mood is normaland affect is normal.  Cranial nerves II - XII are as described above under HEENT exam.  Motor exam: Normal bulk, strength and tone is noted. There is no drift, resting tremor or rebound.  (On5/14/2019: on Archimedes spiral drawing she has mild trembling with both left and right hand. Handwriting is minimally tremulous, legible, not micrographic. She has a very minute postural tremor, no action tremor, no resting or intention tremor.)  (On 05/30/2020: On Archimedes spiral drawing she has mild trembling and insecurity with the left hand, moderate trembling was noted with the right hand, handwriting is larger and tremulous, legible.)  She has a slight postural tremor in both upper extremities, no action tremor.  She has no lower extremity tremors. Fine motor skills are globally slightly impaired.    Sensory exam: intact to light touch in the upper and lower  extremities.  Gait, station and balance:Shestands easily. No veering to one side is noted. No leaning to one side is noted. Posture is age-appropriate andmildly stooped in the upper back.  Stable findings.  Assessmentand Plan:   In summary,Kimberly Harvilleis a very pleasant 59 year oldfemalewith an underlying medical history of hypertension, rheumatic fever as a child, kidney stones, breast cancer, depression, and anxiety, who presents for follow-up consultation and reevaluation as requested by her primary care physician for her tremor disorder of 3+ years.   She primarily has a head tremor.  She has had mild worsening of her head tremor.  Her hand tremor seems to be stable, it is mild.  She had some benefit from low-dose Mysoline which is currently 60 mg strength half a pill.  She already is on a beta-blocker, atenolol low dose, systolic blood pressure is on the lower end of normal, adding a second beta-blocker would not be feasible.  Increasing the atenolol could be considered if deemed safe by the prescriber.  She is encouraged to talk to her oncologist about the possibility of increasing the Mysoline.  Unfortunately, there is not a whole lot we can do at this time other than cautious increase in the Mysoline.  This was discussed at the last appointment when she saw the nurse practitioner.  She is agreeable to pursuing this.  She is advised to follow-up with the nurse practitioner in 6 months, sooner if needed.   She has undergone a brain MRI in 2019.  We also did a dedicated pituitary MRI which turned out to be benign.  I answered all her questions today and she was in agreement. I spent 15 minutes in total face-to-face time and in reviewing records during pre-charting, more  than 50% of which was spent in counseling and coordination of care, reviewing test results, reviewing medications and treatment regimen and/or in discussing or reviewing the diagnosis of ET, the prognosis and treatment  options. Pertinent laboratory and imaging test results that were available during this visit with the patient were reviewed by me and considered in my medical decision making (see chart for details).

## 2021-02-15 ENCOUNTER — Encounter: Payer: Self-pay | Admitting: *Deleted

## 2021-02-15 ENCOUNTER — Telehealth: Payer: Self-pay | Admitting: Neurology

## 2021-02-15 NOTE — Telephone Encounter (Signed)
Pt request refill primidone (MYSOLINE) 50 MG tablet at [facility] #[id number]

## 2021-02-15 NOTE — Progress Notes (Signed)
Received call from pt requesting advice from MD regarding increasing Primidone from 25 mg daily to 50 mg daily while currently taking Tamoxifen.  Per MD okay for pt to increase Primidone.  Pt states she will alert her neurologist and will increase the dosage.  Pt appreciative of advice.

## 2021-02-18 MED ORDER — PRIMIDONE 50 MG PO TABS: ORAL_TABLET | ORAL | 0 refills | Status: DC

## 2021-02-18 NOTE — Telephone Encounter (Signed)
Refill has been sent per pt's request.  ?

## 2021-02-27 ENCOUNTER — Other Ambulatory Visit: Payer: Self-pay | Admitting: Internal Medicine

## 2021-02-27 ENCOUNTER — Ambulatory Visit: Payer: Medicare Other | Admitting: Family Medicine

## 2021-02-27 ENCOUNTER — Other Ambulatory Visit: Payer: Self-pay | Admitting: Neurology

## 2021-02-27 DIAGNOSIS — I10 Essential (primary) hypertension: Secondary | ICD-10-CM

## 2021-02-27 DIAGNOSIS — G252 Other specified forms of tremor: Secondary | ICD-10-CM

## 2021-03-22 ENCOUNTER — Encounter: Payer: Self-pay | Admitting: Internal Medicine

## 2021-03-22 ENCOUNTER — Other Ambulatory Visit: Payer: Self-pay

## 2021-03-22 ENCOUNTER — Ambulatory Visit: Payer: Medicare Other | Attending: Internal Medicine | Admitting: Internal Medicine

## 2021-03-22 VITALS — BP 122/76 | HR 71 | Resp 16 | Wt 152.2 lb

## 2021-03-22 DIAGNOSIS — I10 Essential (primary) hypertension: Secondary | ICD-10-CM | POA: Diagnosis not present

## 2021-03-22 DIAGNOSIS — F5104 Psychophysiologic insomnia: Secondary | ICD-10-CM | POA: Diagnosis not present

## 2021-03-22 DIAGNOSIS — Z79899 Other long term (current) drug therapy: Secondary | ICD-10-CM | POA: Diagnosis not present

## 2021-03-22 DIAGNOSIS — F320 Major depressive disorder, single episode, mild: Secondary | ICD-10-CM | POA: Diagnosis present

## 2021-03-22 DIAGNOSIS — G252 Other specified forms of tremor: Secondary | ICD-10-CM | POA: Insufficient documentation

## 2021-03-22 DIAGNOSIS — F419 Anxiety disorder, unspecified: Secondary | ICD-10-CM | POA: Insufficient documentation

## 2021-03-22 DIAGNOSIS — F959 Tic disorder, unspecified: Secondary | ICD-10-CM | POA: Insufficient documentation

## 2021-03-22 MED ORDER — TRAZODONE HCL 50 MG PO TABS: ORAL_TABLET | ORAL | 3 refills | Status: DC

## 2021-03-22 MED ORDER — PRIMIDONE 50 MG PO TABS: 50.0000 mg | ORAL_TABLET | Freq: Every day | ORAL | 6 refills | Status: DC

## 2021-03-22 NOTE — Patient Instructions (Signed)
Increase trazodone to 50 mg 1-1/2 tablets at bedtime. I have referred you to behavioral health as discussed today.

## 2021-03-22 NOTE — Progress Notes (Addendum)
Patient ID: Kimberly [name], female    DOB: Mar 07, 1956  MRN: [id number]  CC: Hypertension   Subjective: Kimberly [name] is a 64 y.o. 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   Intention Tremor/TIC:  saw Dr. Rexene Alberts 01/23/2021.  Did not have a lot more to offer other than to have the patient increase the dose of the primidone if it is okay with her oncologist given that she is on tamoxifen.  Patient spoke with her oncologist and was told that it was okay to increase to the full 50 mg tablets of the primidone which she has done.  She does not notice a significant difference in her tremors.  Insomnia:  still has problem with sleep maintence.  Not tired during the day  Started taking a Olly Vit OCT for sleep.  She is unsure what is in this vitamin.  She also takes trazodone and melatonin.    Some days she lacks motivation to get out of bed.  Feels she is a little depressed.  Scored 8 on PHQ9 today and 10 on GAD-7.  She is on BuSpar for anxiety.    HTN: Compliant with taking medications and low-salt diet.  No chest pains or shortness of breath.  No lower extremity edema. 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   Intention tremor 10/15/2017   Diabetes mellitus screening 06/10/2017   Breast nodule 03/02/2016   Seasonal allergies 01/07/2016   Right shoulder pain 09/28/2015   Chronic insomnia 07/06/2015    Tinea pedis of both feet 07/06/2015   Nocturia 08/25/2014   Chronic anxiety 08/25/2014   Heme positive stool 08/16/2014   HTN (hypertension) 08/16/2014     Current Outpatient Medications on File Prior to Visit  Medication Sig Dispense Refill   amLODipine (NORVASC) 10 MG tablet TAKE 1 TABLET(10 MG) BY MOUTH DAILY 90 tablet 1   atenolol (TENORMIN) 25 MG tablet TAKE 1 TABLET(25 MG) BY MOUTH DAILY 90 tablet 0   Blood Pressure Monitor DEVI Use as directed to check home blood pressure 2-3 times a week 1 Device 0   busPIRone (BUSPAR) 15 MG tablet Take 1 tablet (15 mg total) by mouth 2 (two) times daily. 180 tablet 3   ibuprofen (ADVIL,MOTRIN) 200 MG tablet Take 200 mg by mouth every 6 (six) hours as needed.     Multiple Vitamin (MULTIVITAMIN WITH MINERALS) TABS tablet Take 1 tablet by mouth daily.      tamoxifen (NOLVADEX) 20 MG tablet TAKE 1 TABLET(20 MG) BY MOUTH DAILY 90 tablet 3   No current facility-administered medications on file prior to visit.    Allergies  Allergen Reactions   Codeine Other (See Comments)    Cold sweats    Social History   Socioeconomic History   Marital status: Single    Spouse name: Not on file   Number of children: Not on file  Years of education: Not on file   Highest education level: Not on file  Occupational History   Not on file  Tobacco Use   Smoking status: Never   Smokeless tobacco: Never  Vaping Use   Vaping Use: Never used  Substance and Sexual Activity   Alcohol use: No    Alcohol/week: 0.0 standard drinks   Drug use: No   Sexual activity: Yes    Birth control/protection: Post-menopausal  Other Topics Concern   Not on file  Social History Narrative   Not on file   Social Determinants of Health   Financial Resource Strain: Low Risk    Difficulty of Paying Living Expenses: Not hard at all  Food Insecurity: No Food Insecurity   Worried About Charity fundraiser in the Last Year: Never true   [place] in the Last Year:  Never true  Transportation Needs: No Transportation Needs   Lack of Transportation (Medical): No   Lack of Transportation (Non-Medical): No  Physical Activity: Inactive   Days of Exercise per Week: 0 days   Minutes of Exercise per Session: 0 min  Stress: Stress Concern Present   Feeling of Stress : Rather much  Social Connections: Not on file  Intimate Partner Violence: Not At Risk   Fear of Current or Ex-Partner: No   Emotionally Abused: No   Physically Abused: No   Sexually Abused: No    Family History  Problem Relation Age of Onset   Breast cancer Mother 34   Cancer Maternal Grandmother        thinks it was braest, but not sure what type of cancer, dx >50   Heart disease Paternal Grandfather    Lung cancer Maternal Aunt    Colon cancer Neg Hx     Past Surgical History:  Procedure Laterality Date   APPENDECTOMY     bowel obstruction     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   BREAST LUMPECTOMY WITH RADIOACTIVE SEED LOCALIZATION Left 10/14/2018   Procedure: BREAST LUMPECTOMY WITH RADIOACTIVE SEED LOCALIZATION X2;  Surgeon: Erroll Luna, MD;  Location: [facility];  Service: General;  Laterality: Left;   CHOLECYSTECTOMY     COLONOSCOPY WITH PROPOFOL N/A 08/05/2018   Procedure: COLONOSCOPY WITH PROPOFOL;  Surgeon: Daneil Dolin, MD;  [facility];  Service: Endoscopy;  Laterality: N/A;  12:45pm   TONSILLECTOMY      ROS: Review of Systems Negative except as stated above  PHYSICAL EXAM: BP 122/76   Pulse 71   Resp 16   Wt 152 lb 3.2 oz (69 kg)   SpO2 96%   BMI 26.13 kg/m   Wt Readings from Last 3 Encounters:  03/22/21 152 lb 3.2 oz (69 kg)  01/23/21 150 lb (68 kg)  01/02/21 147 lb 6.4 oz (66.9 kg)    Physical Exam  General appearance - alert, well appearing, and in no distress Mental status - alert, oriented to person, place, and time Neck - supple, no significant adenopathy Chest - clear to auscultation, no wheezes, rales or rhonchi, symmetric  air entry Heart - normal rate, regular rhythm, normal S1, S2, no murmurs, rubs, clicks or gallops Extremities - peripheral pulses normal, no pedal edema, no clubbing or cyanosis Neuro: She has coarse tremors/tics of the head  Depression screen [facility] 2/9 03/22/2021 01/02/2021 11/22/2020  Decreased Interest 1 2 2   Down, Depressed, Hopeless 2 3 3   PHQ - 2 Score 3 5 5   Altered sleeping  3 3 3   Tired, decreased energy 0 0 0  Change in appetite 0 1 1  Feeling bad or failure about yourself  1 0 0  Trouble concentrating 1 0 0  Moving slowly or fidgety/restless 0 0 0  Suicidal thoughts 0 0 0  PHQ-9 Score 8 9 9   Difficult doing work/chores - Somewhat difficult -  Some recent data might be hidden   GAD 7 : Generalized Anxiety Score 03/22/2021 11/22/2020 07/20/2020 04/19/2020  Nervous, Anxious, on Edge 2 3 0 3  Control/stop worrying 2 2 0 -  Worry too much - different things 2 2 0 2  Trouble relaxing 1 1 0 2  Restless 0 1 0 1  Easily annoyed or irritable 2 1 0 2  Afraid - awful might happen 1 1 0 1  Total GAD 7 Score 10 11 0 -     CMP Latest Ref Rng & Units 11/22/2020 04/19/2020 12/20/2019  Glucose 65 - 99 mg/dL 86 97 202(H)  BUN 8 - 27 mg/dL 16 17 11   Creatinine 0.57 - 1.00 mg/dL 0.79 1.00 0.95  Sodium 134 - 144 mmol/L 140 141 139  Potassium 3.5 - 5.2 mmol/L 5.0 4.4 4.6  Chloride 96 - 106 mmol/L 104 105 102  CO2 20 - 29 mmol/L 16(L) 26 21  Calcium 8.7 - 10.3 mg/dL 10.0 10.0 9.9  Total Protein 6.0 - 8.5 g/dL 7.5 - 7.1  Total Bilirubin 0.0 - 1.2 mg/dL <0.2 - 0.2  Alkaline Phos 44 - 121 IU/L 64 - 63  AST 0 - 40 IU/L 23 - 14  ALT 0 - 32 IU/L 15 - 9   Lipid Panel     Component Value Date/Time   CHOL 152 11/22/2020 1430   TRIG 82 11/22/2020 1430   HDL 71 11/22/2020 1430   CHOLHDL 2.1 11/22/2020 1430   CHOLHDL 2.7 11/13/2016 1102   VLDL 25 11/13/2016 1102   LDLCALC 66 11/22/2020 1430    CBC    Component Value Date/Time   WBC 6.4 04/19/2020 1432   WBC 6.1 10/08/2018 0919   RBC 4.39  04/19/2020 1432   RBC 4.84 10/08/2018 0919   HGB 13.7 04/19/2020 1432   HCT 40.2 04/19/2020 1432   PLT 396 04/19/2020 1432   MCV 92 04/19/2020 1432   MCH 31.2 04/19/2020 1432   MCH 30.4 10/08/2018 0919   MCHC 34.1 04/19/2020 1432   MCHC 32.7 10/08/2018 0919   RDW 12.2 04/19/2020 1432   LYMPHSABS 1.9 10/08/2018 0919   MONOABS 0.6 10/08/2018 0919   EOSABS 0.2 10/08/2018 0919   BASOSABS 0.1 10/08/2018 0919    ASSESSMENT AND PLAN: 1. Essential hypertension At goal.  Continue amlodipine and atenolol.  2. Intention tremor 3. Tic disorder I have sent an updated prescription for the primidone to reflect increased dose of 50 mg daily  4. Chronic insomnia Recommend increasing dose of trazodone to 75 mg daily.  Other medications that we can try is Remeron but this may cause weight gain for her.  I suggested referral to behavioral health for cognitive behavioral therapy to see if that will help.  I again discuss trying her with low-dose of Ambien or Rozerem but patient is afraid of possible side effects.  5. Chronic anxiety Continue BuSpar  6. Current mild episode of major depressive disorder without prior episode ([facility]) Increase trazodone.  Referred to behavioral health. - traZODone (DESYREL) 50 MG tablet; Take 1 1/2 tab PO QHS  Dispense: 45 tablet; Refill: 3 -  Ambulatory referral to Psychiatry   Patient was given the opportunity to ask questions.  Patient verbalized understanding of the plan and was able to repeat key elements of the plan.   Orders Placed This Encounter  Procedures   Ambulatory referral to Psychiatry     Requested Prescriptions   Signed Prescriptions Disp Refills   primidone (MYSOLINE) 50 MG tablet 30 tablet 6    Sig: Take 1 tablet (50 mg total) by mouth at bedtime.   traZODone (DESYREL) 50 MG tablet 45 tablet 3    Sig: Take 1 1/2 tab PO QHS    Return in about 4 months (around 07/22/2021).  Karle Plumber, MD, FACP

## 2021-03-29 ENCOUNTER — Other Ambulatory Visit: Payer: Self-pay | Admitting: Internal Medicine

## 2021-03-29 DIAGNOSIS — I10 Essential (primary) hypertension: Secondary | ICD-10-CM

## 2021-04-24 ENCOUNTER — Ambulatory Visit: Payer: Self-pay | Admitting: *Deleted

## 2021-04-24 ENCOUNTER — Ambulatory Visit
Admission: EM | Admit: 2021-04-24 | Discharge: 2021-04-24 | Disposition: A | Discharge status: Home / Self Care | Payer: Medicare Other

## 2021-04-24 DIAGNOSIS — H0100B Unspecified blepharitis left eye, upper and lower eyelids: Secondary | ICD-10-CM

## 2021-04-24 MED ORDER — TOBRAMYCIN 0.3 % OP SOLN: 1.0000 [drp] | OPHTHALMIC | 0 refills | Status: DC

## 2021-04-24 NOTE — Telephone Encounter (Signed)
Spoke with Patient and she went to the doctor today somewhere else.

## 2021-04-24 NOTE — Telephone Encounter (Signed)
Please contact pt and schedule with any available provider or refer to pec to see cari or urgent care.

## 2021-04-24 NOTE — Telephone Encounter (Signed)
Reason for Disposition . [1] Eye with yellow or green discharge, or eyelashes stick together AND [2] NO PCP standing order to call in antibiotic eye drops  (Exception: [place]; continue triage.)  Answer Assessment - Initial Assessment Questions 1. EYE DISCHARGE: "Is the discharge in one or both eyes?" "What color is it?" "How much is there?" "When did the discharge start?"      Discharge at night - not as bad during the day- L eye 2. REDNESS OF SCLERA: "Is the redness in one or both eyes?" "When did the redness start?"      A little redness 3. EYELIDS: "Are the eyelids red or swollen?" If Yes, ask: "How much?"      no 4. VISION: "Is there any difficulty seeing clearly?"      No changes 5. PAIN: "Is there any pain? If Yes, ask: "How bad is it?" (Scale 1-10; or mild, moderate, severe)    - MILD (1-3): doesn't interfere with normal activities     - MODERATE (4-7): interferes with normal activities or awakens from sleep    - SEVERE (8-10): excruciating pain, unable to do any normal activities       no 6. CONTACT LENS: "Do you wear contacts?"     no 7. OTHER SYMPTOMS: "Do you have any other symptoms?" (e.g., fever, runny nose, cough)     no 8. PREGNANCY: "Is there any chance you are pregnant?" "When was your last menstrual period?"     N/a  Protocols used: Eye - Pus or Exline

## 2021-04-24 NOTE — Telephone Encounter (Signed)
Second attempt to contact patient- left message to call office 

## 2021-04-24 NOTE — Telephone Encounter (Signed)
Summary: eye itching   Patient would like to be contacted about their eye discomfort   The patient shares that they have noticed itchiness, redness as well as a "crust" forming in the corner of both of their eyes   The irritation has occurred for roughly two days   Please contact further when possible     Attempted to call patient regarding her symptoms. Left message to call office

## 2021-04-24 NOTE — ED Provider Notes (Signed)
[facility]   MRN: [id number] DOB: December 17, 1955  Subjective:   Kimberly [name] is a 65 y.o. female presenting for 2-day history of persistent and worsening left thigh drainage, redness, matting of her eyelids.  Denies fever, vision change, photophobia, eye trauma.  Patient does not wear contact lenses.  She wears glasses.  No history of glaucoma.  No current facility-administered medications for this encounter.  Current Outpatient Medications:    amLODipine (NORVASC) 10 MG tablet, TAKE 1 TABLET(10 MG) BY MOUTH DAILY, Disp: 90 tablet, Rfl: 1   atenolol (TENORMIN) 25 MG tablet, TAKE 1 TABLET(25 MG) BY MOUTH DAILY, Disp: 90 tablet, Rfl: 0   Blood Pressure Monitor DEVI, Use as directed to check home blood pressure 2-3 times a week, Disp: 1 Device, Rfl: 0   busPIRone (BUSPAR) 15 MG tablet, Take 1 tablet (15 mg total) by mouth 2 (two) times daily., Disp: 180 tablet, Rfl: 3   ibuprofen (ADVIL,MOTRIN) 200 MG tablet, Take 200 mg by mouth every 6 (six) hours as needed., Disp: , Rfl:    meloxicam (MOBIC) 15 MG tablet, Take 15 mg by mouth daily., Disp: , Rfl:    Multiple Vitamin (MULTIVITAMIN WITH MINERALS) TABS tablet, Take 1 tablet by mouth daily. , Disp: , Rfl:    primidone (MYSOLINE) 50 MG tablet, Take 1 tablet (50 mg total) by mouth at bedtime., Disp: 30 tablet, Rfl: 6   tamoxifen (NOLVADEX) 20 MG tablet, TAKE 1 TABLET(20 MG) BY MOUTH DAILY, Disp: 90 tablet, Rfl: 3   traZODone (DESYREL) 50 MG tablet, Take 1 1/2 tab PO QHS, Disp: 45 tablet, Rfl: 3   Allergies  Allergen Reactions   Codeine Other (See Comments)    Cold sweats    Past Medical History:  Diagnosis Date   Anxiety 1995   Breast cancer ([place]) 2020   Left Breast Cancer   Cancer ([place])    breast left   Depression 1995   Family history of breast cancer    Family history of lung cancer    History of kidney stones    History of rheumatic fever as a child    Hypertension Dx Dec 2015   Panic attack 1995   Personal  history of radiation therapy 2020   Left Breast Cancer   Tremor      Past Surgical History:  Procedure Laterality Date   APPENDECTOMY     bowel obstruction     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   BREAST LUMPECTOMY WITH RADIOACTIVE SEED LOCALIZATION Left 10/14/2018   Procedure: BREAST LUMPECTOMY WITH RADIOACTIVE SEED LOCALIZATION X2;  Surgeon: Erroll Luna, MD;  Location: [facility];  Service: General;  Laterality: Left;   CHOLECYSTECTOMY     COLONOSCOPY WITH PROPOFOL N/A 08/05/2018   Procedure: COLONOSCOPY WITH PROPOFOL;  Surgeon: Daneil Dolin, MD;  [facility];  Service: Endoscopy;  Laterality: N/A;  12:45pm   TONSILLECTOMY      Family History  Problem Relation Age of Onset   Breast cancer Mother 43   Cancer Maternal Grandmother        thinks it was braest, but not sure what type of cancer, dx >50   Heart disease Paternal Grandfather    Lung cancer Maternal Aunt    Colon cancer Neg Hx     Social History   Tobacco Use   Smoking status: Never   Smokeless tobacco: Never  Vaping Use   Vaping Use: Never used  Substance Use Topics   Alcohol use:  No    Alcohol/week: 0.0 standard drinks   Drug use: No    ROS   Objective:   Vitals: BP 128/79 (BP Location: Left Arm)   Pulse 69   Temp 98.1 F (36.7 C) (Oral)   Resp 18   SpO2 95%   Physical Exam Constitutional:      General: She is not in acute distress.    Appearance: Normal appearance. She is well-developed. She is not ill-appearing, toxic-appearing or diaphoretic.  HENT:     Head: Normocephalic and atraumatic.     Right Ear: External ear normal.     Left Ear: External ear normal.     Nose: Nose normal.     Mouth/Throat:     Mouth: Mucous membranes are moist.     Pharynx: Oropharynx is clear.  Eyes:     General: No scleral icterus.       Right eye: No discharge.        Left eye: Discharge present.    Extraocular Movements: Extraocular movements intact.     Pupils: Pupils are equal,  round, and reactive to light.     Comments: Matting at the base of the eyelashes bordering the eyelid.  Left conjunctive a slightly injected medially.  No photophobia.  Cardiovascular:     Rate and Rhythm: Normal rate.  Pulmonary:     Effort: Pulmonary effort is normal.  Skin:    General: Skin is warm and dry.  Neurological:     General: No focal deficit present.     Mental Status: She is alert and oriented to person, place, and time.  Psychiatric:        Mood and Affect: Mood normal.        Behavior: Behavior normal.        Thought Content: Thought content normal.        Judgment: Judgment normal.      Assessment and Plan :   PDMP not reviewed this encounter.  1. Blepharitis of both upper and lower eyelid of left eye, unspecified type     Patient did not want to use an ointment, recommended tobramycin for coverage of blepharitis. Counseled patient on potential for adverse effects with medications prescribed/recommended today, ER and return-to-clinic precautions discussed, patient verbalized understanding.    Jaynee Eagles, PA-C 04/24/21 1622

## 2021-04-24 NOTE — Telephone Encounter (Signed)
Copied from [place] [id number]. Topic: General - Other >> Apr 24, 2021  8:53 AM Tessa Lerner A wrote: Reason for CRM: Patient has been experiencing eye discomfort for roughly two days  The patient shares that their eyes have been red and there is a "crust" forming in the corner of both eyes  The patient would like to be prescribed something to help with the discomfort   Patient declined to make an appt at the time of call with agent   Please contact further if needed   Patient declined appt with Cari at [facility] today as well.

## 2021-04-24 NOTE — ED Triage Notes (Signed)
Two day h/o left eye drainage and redness. Denies eye pain. No vision changes. Pt notes that her eye is crusty when she wakes in the AM. Also c/o feeling as if something is stuck in her eye. No right eye sxs. No meds taken.

## 2021-05-25 ENCOUNTER — Telehealth: Payer: Self-pay | Admitting: Internal Medicine

## 2021-05-25 DIAGNOSIS — G252 Other specified forms of tremor: Secondary | ICD-10-CM

## 2021-05-25 DIAGNOSIS — F320 Major depressive disorder, single episode, mild: Secondary | ICD-10-CM

## 2021-05-25 DIAGNOSIS — I10 Essential (primary) hypertension: Secondary | ICD-10-CM

## 2021-05-25 NOTE — Telephone Encounter (Signed)
Requested Prescriptions  Pending Prescriptions Disp Refills  . traZODone (DESYREL) 50 MG tablet [Pharmacy Med Name: TRAZODONE '50MG'$  TABLETS] 30 tablet     Sig: TAKE 1 TABLET(50 MG) BY MOUTH AT BEDTIME     Psychiatry: Antidepressants - Serotonin Modulator Passed - 05/25/2021  7:03 AM      Passed - Completed PHQ-2 or PHQ-9 in the last 360 days      Passed - Valid encounter within last 6 months    Recent Outpatient Visits          2 months ago Essential hypertension   [place], MD   4 months ago Encounter for [employer] annual wellness exam   [place], RPH-CPP   6 months ago Essential hypertension   [place], MD   9 months ago Rash and nonspecific skin eruption   [place], MD   10 months ago Essential hypertension   [place], MD      Future Appointments            In 2 months Wynetta Emery Dalbert [place], MD [place]           . atenolol (TENORMIN) 25 MG tablet [Pharmacy Med Name: ATENOLOL '25MG'$  TABLETS] 90 tablet 0    Sig: TAKE 1 TABLET(25 MG) BY MOUTH DAILY     Cardiovascular:  Beta Blockers Passed - 05/25/2021  7:03 AM      Passed - Last BP in normal range    BP Readings from Last 1 Encounters:  04/24/21 128/79         Passed - Last Heart Rate in normal range    Pulse Readings from Last 1 Encounters:  04/24/21 69         Passed - Valid encounter within last 6 months    Recent Outpatient Visits          2 months ago Essential hypertension   [place], MD   4 months ago Encounter for [employer] annual wellness exam   [place], RPH-CPP   6 months ago Essential hypertension   [place], MD   9 months ago Rash and nonspecific skin eruption   [place], MD   10 months ago Essential hypertension   [place], MD      Future Appointments            In 2 months Wynetta Emery Dalbert Batman, MD [place]

## 2021-05-27 MED ORDER — TRAZODONE HCL 50 MG PO TABS: ORAL_TABLET | ORAL | 6 refills | Status: DC

## 2021-05-27 NOTE — Telephone Encounter (Signed)
Pt called asking why the Trazodone was denied.  She states she always takes those at night for sleep.  CB#  [phone]

## 2021-05-27 NOTE — Addendum Note (Signed)
Addended by: Karle Plumber B on: 05/27/2021 12:36 PM   Modules accepted: Orders

## 2021-05-27 NOTE — Telephone Encounter (Signed)
Unable to reach. No voicemail and No answer.

## 2021-07-24 ENCOUNTER — Other Ambulatory Visit: Payer: Self-pay | Admitting: Internal Medicine

## 2021-07-24 DIAGNOSIS — F419 Anxiety disorder, unspecified: Secondary | ICD-10-CM

## 2021-07-24 NOTE — Patient Instructions (Signed)
Below is our plan:  We will continue primidone 50mg  daily. I will refer you to [facility] for consideration of procedural options for tremor management. I will also refer you to occupational therapy to help with lifestyle adjustments. Please contact Dr Wynetta Emery regarding referral to psychiatry as she placed this referral in June. I would really like for you to be seen to help with insomnia and anxiety.   Please make sure you are staying well hydrated. I recommend 50-60 ounces daily. Well balanced diet and regular exercise encouraged. Consistent sleep schedule with 6-8 hours recommended.   Please continue follow up with care team as directed.   Follow up with me in 6 months, if seen by [facility] and you decide to do one of the procedures, please call and let me know.   You may receive a survey regarding today's visit. I encourage you to leave honest feed back as I do use this information to improve patient care. Thank you for seeing me today!

## 2021-07-24 NOTE — Progress Notes (Signed)
Chief Complaint  Patient presents with   Follow-up    New room. Follow up/tremors. Worse since last visit, unable to write now. Wakes up a lot d/t tremors. Hard to eat d/t tremors.       HISTORY OF PRESENT ILLNESS: 07/25/21 ALL: Kimberly [name] returns for follow up for essential tremor. She is taking primidone 50mg  daily, now prescribed by PCP. She is not certain if it has helped much at all. She continues to have trouble with facial and hand tremors but reports that her whole body can shake. She is very hesitant about medication side effects. She has been reluctant to consider procedural based options but feels she is ready to talk to someone. She admits that anxiety and insomnia continue to pose a significant problem for her. She continues buspirone for anxiety. She feels that she does fairly well during the day and is distracted by television shows. At night, she lies awake for hours. She is unable to tell me how long she sleeps, on average. She feels that she only gets 2-3 hours of sleep at night. She reports her mind races and her "nerves" keep her up. Trazodone was increased to 75mg  at bedtime for insomnia and referral placed to psychiatry. She does not feel trazodone has helped much. She reports that no one has contacted her regarding referral.   08/30/20 ALL:  Kimberly [name] is a 65 y.o. female here today for follow up for essential tremor. She was started on primidone 50mg  at bedtime following approval from oncology due to potential effects on tamoxifen levels. Oncology approved taking 1/2 tablet daily. She has continues 25mg  daily and feels it may help a little during the day but tremor worsens at night. She feels that inconsistent sleep patterns and anxiety contribute. She has follow up with oncology next month and PCP in February.    HISTORY (copied from Dr Guadelupe Sabin previous note)  Dear Dr. Wynetta Emery,   I saw your patient, Kimberly [name], upon your kind request in my neurologic clinic  today for Reevaluation of her tremor disorder.  The patient is unaccompanied today. As you know, Kimberly [name] is a 65 year old right-handed woman with an underlying medical history of hypertension, rheumatic fever as a child, kidney stones, breast cancer, depression, and anxiety, who reports worsening tremors affecting her hands, particularly her right hand and also her neck and head area.  She has not had any recent falls.  She does have some issues with her balance however.  She tries to hydrate well with water but sometimes forgets to drink water.  She does not drink any caffeine or alcohol on a regular basis.   I have evaluated her a little over 2 years ago for hand tremors, at which time her tremors were mild.  She had a nonfocal neurologic exam.  I suggested we proceed with a brain MRI to rule out a structural cause.  She had a brain MRI with and without contrast on 03/15/2018 and I reviewed the results: IMPRESSION: This MRI of the brain with and without contrast shows the following: 1.   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  It is next to the head of the right caudate but does not appear to involve the basal ganglia. 2.   Scattered foci in the subcortical and deep white matter of the hemispheres consistent with chronic microvascular ischemic changes.  None of the foci appear to be acute. 3.   There is  asymmetry of the pituitary gland.  The gland appears to enhance homogenously.  The pituitary stalk is midline.  This could be an incidental finding or due to a microadenoma.  If clinically indicated, consider dedicated pituitary imaging. 4.   Chronic ethmoid and right frontal sinusitis. 5.   There are no acute findings.   We called her with her test results and she was advised to proceed with a dedicated pituitary gland MRI.  She was agreeable to this.  She had a repeat brain MRI with special attention to the pituitary gland on 03/31/2018 with and  without contrast and I reviewed the results:  IMPRESSION:    MRI brain and pituitary (with and without) demonstrating: 1.  Mild scattered periventricular subcortical foci of nonspecific gliosis. 2.  Dedicated pituitary gland imaging appears normal enhancing pituitary gland tissue.  No evidence of underlying pituitary mass. 3.  Incidental right frontal developmental venous anomaly.   She reports no other new symptoms, reports no pain no weakness, no facial symptoms otherwise.  She does have some voice tremor.  She reports difficulty with sleep, she takes trazodone for sleep but is often still awake at night and tosses and turns.  She is not sure that the tremor is affecting her sleep.   Previously:    02/23/18: 65 year old right-handed woman with an underlying medical history of anxiety, hypertension, seasonal allergies, insomnia, and overweight state, who reports a bilateral hand tremor for the past 1-2 years. I reviewed your office note from 12/03/2017. She has been on atenolol, 25 mg once daily, she has not noticed much in the way of tremor benefit from it. She admits that she does not drink enough water, sometimes only 1 or 2 cups per day, does not drink soda daily, and does not like caffeine in the form of tea or coffee. Sometimes she drinks some juice.  She is single and lives with her daughter, she has 1 grown daughter. She is a nonsmoker and does not utilize alcohol. She had routine blood work last year in February which I reviewed, TSH was normal at the time. She has a follow-up appointment next week. She is not familiar with her father side of her family history. She has 1 brother and 1 sister, neither one with tremors. Her mother did not have a tremor, she died at 19 from cancer. 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     REVIEW OF SYSTEMS: Out of a complete 14 system review of symptoms, the patient complains only of the following symptoms, tremor, anxiety, insomnia and all other reviewed  systems are negative.   ALLERGIES: Allergies  Allergen Reactions   Codeine Other (See Comments)    Cold sweats     HOME MEDICATIONS: Outpatient Medications Prior to Visit  Medication Sig Dispense Refill   amLODipine (NORVASC) 10 MG tablet TAKE 1 TABLET(10 MG) BY MOUTH DAILY 90 tablet 1   atenolol (TENORMIN) 25 MG tablet TAKE 1 TABLET(25 MG) BY MOUTH DAILY 90 tablet 0   Blood Pressure Monitor DEVI Use as directed to check home blood pressure 2-3 times a week 1 Device 0   busPIRone (BUSPAR) 15 MG tablet TAKE 1 TABLET(15 MG) BY MOUTH TWICE DAILY 180 tablet 0   ibuprofen (ADVIL,MOTRIN) 200 MG tablet Take 200 mg by mouth every 6 (six) hours as needed.     Multiple Vitamin (MULTIVITAMIN WITH MINERALS) TABS tablet Take 1 tablet by mouth daily.      primidone (MYSOLINE) 50 MG tablet Take 1 tablet (50  mg total) by mouth at bedtime. 30 tablet 6   tamoxifen (NOLVADEX) 20 MG tablet TAKE 1 TABLET(20 MG) BY MOUTH DAILY 90 tablet 3   tobramycin (TOBREX) 0.3 % ophthalmic solution Place 1 drop into the left eye every 4 (four) hours. 5 mL 0   traZODone (DESYREL) 50 MG tablet Take 1 1/2 tab PO QHS 45 tablet 6   meloxicam (MOBIC) 15 MG tablet Take 15 mg by mouth daily.     No facility-administered medications prior to visit.     PAST MEDICAL HISTORY: Past Medical History:  Diagnosis Date   Anxiety 1995   Breast cancer ([place]) 2020   Left Breast Cancer   Cancer ([place])    breast left   Depression 1995   Family history of breast cancer    Family history of lung cancer    History of kidney stones    History of rheumatic fever as a child    Hypertension Dx Dec 2015   Panic attack 1995   Personal history of radiation therapy 2020   Left Breast Cancer   Tremor      PAST SURGICAL HISTORY: Past Surgical History:  Procedure Laterality Date   APPENDECTOMY     bowel obstruction     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   BREAST LUMPECTOMY WITH RADIOACTIVE SEED LOCALIZATION Left 10/14/2018    Procedure: BREAST LUMPECTOMY WITH RADIOACTIVE SEED LOCALIZATION X2;  Surgeon: Erroll Luna, MD;  Location: [place];  Service: General;  Laterality: Left;   CHOLECYSTECTOMY     COLONOSCOPY WITH PROPOFOL N/A 08/05/2018   Procedure: COLONOSCOPY WITH PROPOFOL;  Surgeon: Daneil Dolin, MD;  [facility];  Service: Endoscopy;  Laterality: N/A;  12:45pm   TONSILLECTOMY       FAMILY HISTORY: Family History  Problem Relation Age of Onset   Breast cancer Mother 22   Cancer Maternal Grandmother        thinks it was braest, but not sure what type of cancer, dx >50   Heart disease Paternal Grandfather    Lung cancer Maternal Aunt    Colon cancer Neg Hx      SOCIAL HISTORY: Social History   Socioeconomic History   Marital status: Single    Spouse name: Not on file   Number of children: Not on file   Years of education: Not on file   Highest education level: Not on file  Occupational History   Not on file  Tobacco Use   Smoking status: Never   Smokeless tobacco: Never  Vaping Use   Vaping Use: Never used  Substance and Sexual Activity   Alcohol use: No    Alcohol/week: 0.0 standard drinks   Drug use: No   Sexual activity: Yes    Birth control/protection: Post-menopausal  Other Topics Concern   Not on file  Social History Narrative   Not on file   Social Determinants of Health   Financial Resource Strain: Low Risk    Difficulty of Paying Living Expenses: Not hard at all  Food Insecurity: No Food Insecurity   Worried About Charity fundraiser in the Last Year: Never true   [place] in the Last Year: Never true  Transportation Needs: No Transportation Needs   Lack of Transportation (Medical): No   Lack of Transportation (Non-Medical): No  Physical Activity: Inactive   Days of Exercise per Week: 0 days   Minutes of Exercise per Session: 0 min  Stress: Stress Concern  Present   Feeling of Stress : Rather much  Social Connections: Not on file  Intimate  Partner Violence: Not At Risk   Fear of Current or Ex-Partner: No   Emotionally Abused: No   Physically Abused: No   Sexually Abused: No      PHYSICAL EXAM  Vitals:   07/25/21 0955  BP: 130/85  Pulse: 75  Weight: 154 lb (69.9 kg)  Height: 5\' 4"  (1.626 m)    Body mass index is 26.43 kg/m.   Generalized: Well developed, in no acute distress  Cardiology: normal rate and rhythm, no murmur auscultated  Respiratory: clear to auscultation bilaterally    Neurological examination  Mentation: Alert oriented to time, place, history taking. Follows all commands speech and language fluent Cranial nerve II-XII: Pupils were equal round reactive to light. Extraocular movements were full, visual field were full on confrontational test. Facial sensation and strength were normal. Head turning and shoulder shrug  were normal and symmetric. Motor: The motor testing reveals 5 over 5 strength of all 4 extremities. Good symmetric motor tone is noted throughout. Moderate action tremor of bilateral hands and head noted during visit  Gait and station: Gait is normal.     DIAGNOSTIC DATA (LABS, IMAGING, TESTING) - I reviewed patient records, labs, notes, testing and imaging myself where available.  Lab Results  Component Value Date   WBC 6.4 04/19/2020   HGB 13.7 04/19/2020   HCT 40.2 04/19/2020   MCV 92 04/19/2020   PLT 396 04/19/2020      Component Value Date/Time   NA 140 11/22/2020 1430   K 5.0 11/22/2020 1430   CL 104 11/22/2020 1430   CO2 16 (L) 11/22/2020 1430   GLUCOSE 86 11/22/2020 1430   GLUCOSE 104 (H) 10/08/2018 0919   BUN 16 11/22/2020 1430   CREATININE 0.79 11/22/2020 1430   CREATININE 0.92 09/15/2018 0820   CREATININE 0.98 11/13/2016 1102   CALCIUM 10.0 11/22/2020 1430   PROT 7.5 11/22/2020 1430   ALBUMIN 4.5 11/22/2020 1430   AST 23 11/22/2020 1430   AST 16 09/15/2018 0820   ALT 15 11/22/2020 1430   ALT 11 09/15/2018 0820   ALKPHOS 64 11/22/2020 1430   BILITOT  <0.2 11/22/2020 1430   BILITOT 0.4 09/15/2018 0820   GFRNONAA 78 11/22/2020 1430   GFRNONAA >60 09/15/2018 0820   GFRNONAA 62 11/13/2016 1102   GFRAA 90 11/22/2020 1430   GFRAA >60 09/15/2018 0820   GFRAA 71 11/13/2016 1102   Lab Results  Component Value Date   CHOL 152 11/22/2020   HDL 71 11/22/2020   LDLCALC 66 11/22/2020   TRIG 82 11/22/2020   CHOLHDL 2.1 11/22/2020   Lab Results  Component Value Date   HGBA1C 5.6 04/19/2020   Lab Results  Component Value Date   [id number] 716 11/22/2020   Lab Results  Component Value Date   TSH 1.910 04/19/2020      ASSESSMENT AND PLAN  65 y.o. year old female  has a past medical history of Anxiety (1995), Breast cancer ([place]) (2020), Cancer ([place]), Depression (1995), Family history of breast cancer, Family history of lung cancer, History of kidney stones, History of rheumatic fever as a child, Hypertension (Dx Dec 2015), Panic attack (1995), Personal history of radiation therapy (2020), and Tremor. here with   Essential tremor - Plan: Ambulatory referral to Neurology, Ambulatory referral to [facility] has not noted any significant benefit with increased dose of primidone to 50mg  daily. Dosage  limited due to being on tamoxifen. She is very hesitant about medication side effects. She also continues atenolol for BP prescribed by PCP. We have discussed trial of topiramate versus referral to [facility] for second opinion as well as discussion of possible procedural based treatment options like DBS or Neurovive. She would like to proceed with referral. I will also refer her to OT for lifestyle accomodation for tremor. She will work on sleep hygiene. Weighted utensils or wrists weights may be helpful/Healthy lifestyle habits encouraged. She will call Dr Wynetta Emery to discuss status of psychiatry referral. She will follow up in 6 months, sooner if needed.    Debbora Presto, MSN, FNP-C 07/25/2021, 10:42 AM  [facility] [address], [place], [place] [address] [phone]

## 2021-07-25 ENCOUNTER — Ambulatory Visit (INDEPENDENT_AMBULATORY_CARE_PROVIDER_SITE_OTHER): Payer: Medicare Other | Admitting: Family Medicine

## 2021-07-25 ENCOUNTER — Encounter: Payer: Self-pay | Admitting: Family Medicine

## 2021-07-25 ENCOUNTER — Ambulatory Visit: Payer: Medicare Other | Admitting: Neurology

## 2021-07-25 ENCOUNTER — Other Ambulatory Visit: Payer: Self-pay

## 2021-07-25 VITALS — BP 130/85 | HR 75 | Ht 64.0 in | Wt 154.0 lb

## 2021-07-25 DIAGNOSIS — G250 Essential tremor: Secondary | ICD-10-CM | POA: Diagnosis not present

## 2021-07-29 ENCOUNTER — Telehealth: Payer: Self-pay | Admitting: Family Medicine

## 2021-07-29 NOTE — Telephone Encounter (Signed)
Referral to [facility] Neurology has been sent via [facility]. Phone: [phone].

## 2021-08-02 ENCOUNTER — Ambulatory Visit: Payer: Medicare Other | Attending: Internal Medicine | Admitting: Internal Medicine

## 2021-08-02 ENCOUNTER — Other Ambulatory Visit: Payer: Self-pay

## 2021-08-02 VITALS — BP 124/80 | HR 77 | Resp 16 | Wt 155.0 lb

## 2021-08-02 DIAGNOSIS — F329 Major depressive disorder, single episode, unspecified: Secondary | ICD-10-CM | POA: Diagnosis not present

## 2021-08-02 DIAGNOSIS — Z7901 Long term (current) use of anticoagulants: Secondary | ICD-10-CM | POA: Insufficient documentation

## 2021-08-02 DIAGNOSIS — F320 Major depressive disorder, single episode, mild: Secondary | ICD-10-CM | POA: Diagnosis not present

## 2021-08-02 DIAGNOSIS — Z803 Family history of malignant neoplasm of breast: Secondary | ICD-10-CM | POA: Insufficient documentation

## 2021-08-02 DIAGNOSIS — G252 Other specified forms of tremor: Secondary | ICD-10-CM | POA: Insufficient documentation

## 2021-08-02 DIAGNOSIS — Z79899 Other long term (current) drug therapy: Secondary | ICD-10-CM | POA: Insufficient documentation

## 2021-08-02 DIAGNOSIS — R7303 Prediabetes: Secondary | ICD-10-CM | POA: Insufficient documentation

## 2021-08-02 DIAGNOSIS — F959 Tic disorder, unspecified: Secondary | ICD-10-CM | POA: Diagnosis not present

## 2021-08-02 DIAGNOSIS — Z8249 Family history of ischemic heart disease and other diseases of the circulatory system: Secondary | ICD-10-CM | POA: Insufficient documentation

## 2021-08-02 DIAGNOSIS — Z1231 Encounter for screening mammogram for malignant neoplasm of breast: Secondary | ICD-10-CM | POA: Insufficient documentation

## 2021-08-02 DIAGNOSIS — F5104 Psychophysiologic insomnia: Secondary | ICD-10-CM | POA: Insufficient documentation

## 2021-08-02 DIAGNOSIS — I10 Essential (primary) hypertension: Secondary | ICD-10-CM | POA: Diagnosis present

## 2021-08-02 DIAGNOSIS — F419 Anxiety disorder, unspecified: Secondary | ICD-10-CM | POA: Diagnosis not present

## 2021-08-02 NOTE — Progress Notes (Signed)
Patient ID: Kimberly [name], female    DOB: 10-16-1955  MRN: [id number]  CC: Hypertension   Subjective: Kimberly [name] is a 65 y.o. 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  Intention Tremor/TIC: Primidone increased on last visit to full 50 mg tab daily.  Pt feels the tremors/TICs are no better. She had a recent follow-up appointment with the neurology NP.  She recommended some occupational therapy for lifestyle accommodation for tremor.  She also referred her to neurology at [facility] for second opinion as well as discussion of possible procedural-based treatment options.  Depression/insomnia: Reports ongoing issues with insomnia which is chronic for her.  She is on trazodone and takes melatonin.  I discussed trial of low-dose Ambien or Rozerem in the past but patient is afraid of possible side effects.  Referred to psychiatry for cognitive behavioral therapy and for mild depression on last visit.  However patient states that she has not received an appointment as yet. -She reports good appetite.  HTN:  compliant with Atenolol and Norvasc.  She limits salt in foods No chest pains or shortness of breath.  No lower extremity swelling.  HM: She reports having had 3 COVID-19 vaccines so far.  She does not have her card with her today with the date of the booster shot on it for me to update her health maintenance.  She is coming due for mammogram. 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   Intention tremor 10/15/2017   Diabetes mellitus screening 06/10/2017   Breast nodule 03/02/2016   Seasonal allergies 01/07/2016   Right shoulder pain 09/28/2015   Chronic insomnia 07/06/2015   Tinea pedis of both feet 07/06/2015   Nocturia 08/25/2014   Chronic anxiety 08/25/2014   Heme positive stool 08/16/2014   HTN (hypertension) 08/16/2014     Current Outpatient Medications on File Prior to Visit  Medication Sig Dispense Refill   amLODipine (NORVASC) 10 MG tablet TAKE 1 TABLET(10 MG) BY MOUTH DAILY 90 tablet 1   atenolol (TENORMIN) 25 MG tablet TAKE 1 TABLET(25 MG) BY MOUTH DAILY 90 tablet 0   Blood Pressure Monitor DEVI Use as directed to check home blood pressure 2-3 times a week 1 Device 0   busPIRone (BUSPAR) 15 MG tablet TAKE 1 TABLET(15 MG) BY MOUTH TWICE DAILY 180 tablet 0   Multiple Vitamin (MULTIVITAMIN WITH MINERALS) TABS tablet Take 1 tablet by mouth daily.      primidone (MYSOLINE) 50 MG tablet Take 1 tablet (50 mg total) by mouth at bedtime. 30 tablet 6   tamoxifen (NOLVADEX) 20 MG tablet TAKE 1 TABLET(20 MG) BY MOUTH DAILY 90 tablet 3   tobramycin (TOBREX) 0.3 % ophthalmic solution Place 1 drop into the left eye every 4 (four) hours. 5 mL 0   traZODone (DESYREL) 50 MG tablet Take 1 1/2 tab PO QHS 45 tablet 6  ibuprofen (ADVIL,MOTRIN) 200 MG tablet Take 200 mg by mouth every 6 (six) hours as needed. (Patient not taking: Reported on 08/02/2021)     No current facility-administered medications on file prior to visit.    Allergies  Allergen Reactions   Codeine Other (See Comments)    Cold sweats    Social History   Socioeconomic History   Marital status: Single    Spouse name: Not on file   Number of children: Not on file   Years of education: Not on file   Highest education level: Not on file  Occupational History   Not on file  Tobacco Use   Smoking  status: Never   Smokeless tobacco: Never  Vaping Use   Vaping Use: Never used  Substance and Sexual Activity   Alcohol use: No    Alcohol/week: 0.0 standard drinks   Drug use: No   Sexual activity: Yes    Birth control/protection: Post-menopausal  Other Topics Concern   Not on file  Social History Narrative   Not on file   Social Determinants of Health   Financial Resource Strain: Low Risk    Difficulty of Paying Living Expenses: Not hard at all  Food Insecurity: No Food Insecurity   Worried About Charity fundraiser in the Last Year: Never true   Ran Out of Food in the Last Year: Never true  Transportation Needs: No Transportation Needs   Lack of Transportation (Medical): No   Lack of Transportation (Non-Medical): No  Physical Activity: Inactive   Days of Exercise per Week: 0 days   Minutes of Exercise per Session: 0 min  Stress: Stress Concern Present   Feeling of Stress : Rather much  Social Connections: Not on file  Intimate Partner Violence: Not At Risk   Fear of Current or Ex-Partner: No   Emotionally Abused: No   Physically Abused: No   Sexually Abused: No    Family History  Problem Relation Age of Onset   Breast cancer Mother 59   Cancer Maternal Grandmother        thinks it was braest, but not sure what type of cancer, dx >50   Heart disease Paternal Grandfather    Lung cancer Maternal Aunt    Colon cancer Neg Hx     Past Surgical History:  Procedure Laterality Date   APPENDECTOMY     bowel obstruction     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   BREAST LUMPECTOMY WITH RADIOACTIVE SEED LOCALIZATION Left 10/14/2018   Procedure: BREAST LUMPECTOMY WITH RADIOACTIVE SEED LOCALIZATION X2;  Surgeon: Erroll Luna, MD;  Location: [place];  Service: General;  Laterality: Left;   CHOLECYSTECTOMY     COLONOSCOPY WITH PROPOFOL N/A 08/05/2018   Procedure: COLONOSCOPY WITH PROPOFOL;  Surgeon: Daneil Dolin, MD;  [facility];  Service: Endoscopy;   Laterality: N/A;  12:45pm   TONSILLECTOMY      ROS: Review of Systems Negative except as stated above  PHYSICAL EXAM: BP 124/80   Pulse 77   Resp 16   Wt 155 lb (70.3 kg)   SpO2 96%   BMI 26.61 kg/m   Wt Readings from Last 3 Encounters:  08/02/21 155 lb (70.3 kg)  07/25/21 154 lb (69.9 kg)  03/22/21 152 lb 3.2 oz (69 kg)    Physical Exam  General appearance - alert, well appearing, elderly African-American female and in no distress Mental status - normal mood, behavior, speech, dress,  and thought processes  Chest - clear to auscultation, no wheezes, rales or rhonchi, symmetric air entry Heart - normal rate, regular rhythm, normal S1, S2, no murmurs, rubs, clicks or gallops Extremities - peripheral pulses normal, no pedal edema, no clubbing or cyanosis Neurologic: She has tic of the head and of her voice.  She also has tremors especially in the upper extremities Depression screen [facility] 2/9 08/02/2021 03/22/2021 01/02/2021  Decreased Interest 1 1 2   Down, Depressed, Hopeless 0 2 3  PHQ - 2 Score 1 3 5   Altered sleeping - 3 3  Tired, decreased energy - 0 0  Change in appetite - 0 1  Feeling bad or failure about yourself  - 1 0  Trouble concentrating - 1 0  Moving slowly or fidgety/restless - 0 0  Suicidal thoughts - 0 0  PHQ-9 Score - 8 9  Difficult doing work/chores - - Somewhat difficult  Some recent data might be hidden    CMP Latest Ref Rng & Units 11/22/2020 04/19/2020 12/20/2019  Glucose 65 - 99 mg/dL 86 97 202(H)  BUN 8 - 27 mg/dL 16 17 11   Creatinine 0.57 - 1.00 mg/dL 0.79 1.00 0.95  Sodium 134 - 144 mmol/L 140 141 139  Potassium 3.5 - 5.2 mmol/L 5.0 4.4 4.6  Chloride 96 - 106 mmol/L 104 105 102  CO2 20 - 29 mmol/L 16(L) 26 21  Calcium 8.7 - 10.3 mg/dL 10.0 10.0 9.9  Total Protein 6.0 - 8.5 g/dL 7.5 - 7.1  Total Bilirubin 0.0 - 1.2 mg/dL <0.2 - 0.2  Alkaline Phos 44 - 121 IU/L 64 - 63  AST 0 - 40 IU/L 23 - 14  ALT 0 - 32 IU/L 15 - 9   Lipid Panel      Component Value Date/Time   CHOL 152 11/22/2020 1430   TRIG 82 11/22/2020 1430   HDL 71 11/22/2020 1430   CHOLHDL 2.1 11/22/2020 1430   CHOLHDL 2.7 11/13/2016 1102   VLDL 25 11/13/2016 1102   LDLCALC 66 11/22/2020 1430    CBC    Component Value Date/Time   WBC 6.4 04/19/2020 1432   WBC 6.1 10/08/2018 0919   RBC 4.39 04/19/2020 1432   RBC 4.84 10/08/2018 0919   HGB 13.7 04/19/2020 1432   HCT 40.2 04/19/2020 1432   PLT 396 04/19/2020 1432   MCV 92 04/19/2020 1432   MCH 31.2 04/19/2020 1432   MCH 30.4 10/08/2018 0919   MCHC 34.1 04/19/2020 1432   MCHC 32.7 10/08/2018 0919   RDW 12.2 04/19/2020 1432   LYMPHSABS 1.9 10/08/2018 0919   MONOABS 0.6 10/08/2018 0919   EOSABS 0.2 10/08/2018 0919   BASOSABS 0.1 10/08/2018 0919    ASSESSMENT AND PLAN: 1. Essential hypertension At goal.  Continue current medications and low-salt diet.  2. Tic disorder 3. Intention tremor Patient awaiting appointment with [facility] neurology for second opinion and to discuss treatment options since primidone and atenolol are not controlling her symptoms.  4. Current mild episode of major depressive disorder without prior episode [facility]) Message sent to our referral coordinator requesting that she check into the appointment with behavioral health  5. Chronic insomnia See #4 above.  Continue to encourage good sleep hygiene.  6. Encounter for screening mammogram for malignant neoplasm of breast - MM Digital Screening; Future   Patient was given the opportunity to ask questions.  Patient verbalized understanding of the plan and was able to repeat key elements of the plan.   Orders Placed This  Encounter  Procedures   MM Digital Screening     Requested Prescriptions    No prescriptions requested or ordered in this encounter    Return in about 4 months (around 12/03/2021).  Karle Plumber, MD, FACP

## 2021-08-02 NOTE — Patient Instructions (Signed)
I will have our referral person look into the referral to [facility] that was submitted on last visit.

## 2021-08-07 ENCOUNTER — Ambulatory Visit: Payer: Medicare Other | Admitting: Occupational Therapy

## 2021-08-08 ENCOUNTER — Ambulatory Visit: Payer: Medicare Other | Attending: Family Medicine | Admitting: Occupational Therapy

## 2021-08-08 ENCOUNTER — Encounter: Payer: Self-pay | Admitting: Occupational Therapy

## 2021-08-08 ENCOUNTER — Other Ambulatory Visit: Payer: Self-pay

## 2021-08-08 DIAGNOSIS — R278 Other lack of coordination: Secondary | ICD-10-CM | POA: Diagnosis present

## 2021-08-08 DIAGNOSIS — R2689 Other abnormalities of gait and mobility: Secondary | ICD-10-CM | POA: Insufficient documentation

## 2021-08-08 DIAGNOSIS — R2681 Unsteadiness on feet: Secondary | ICD-10-CM | POA: Insufficient documentation

## 2021-08-08 DIAGNOSIS — R251 Tremor, unspecified: Secondary | ICD-10-CM | POA: Diagnosis not present

## 2021-08-08 DIAGNOSIS — M6281 Muscle weakness (generalized): Secondary | ICD-10-CM | POA: Diagnosis not present

## 2021-08-08 DIAGNOSIS — Z7409 Other reduced mobility: Secondary | ICD-10-CM | POA: Diagnosis not present

## 2021-08-08 NOTE — Patient Instructions (Addendum)
Use proper body mechanics. Body mechanics are the way in which you use and position your body to be most effective at completing a task. Simply sitting with your knees and hips in line, and placing both feet on the floor, will help to set your body up for success. This will decrease the stress on your arms and hands.  Maintain core stability for hand control.  Make sure your core is stable and in a good position by sitting in a chair with back and arm support; this will increase your ability to have controlled hand movements. Decreasing the demand on your hands and fingers will allow you to do tasks more efficiently, and use less effort.  Tuck your elbows in toward your body or support them on a surface. Tucking your elbows in toward your body or placing them on a supportive surface provides support. Sometimes, one-hand can be used to support the elbow of the opposite arm during an activity to further increase steadiness.   Access Code: [email or web address] URL: [email or web address] Date: 08/08/2021 Prepared by: Simonne Come  Patient Education Understanding Energy Conservation

## 2021-08-08 NOTE — Therapy (Signed)
[facility] [address], [place], [place], [address] Phone: [phone]   Fax:  [phone]  Occupational Therapy Evaluation  Patient Details  Name: Kimberly [name] MRN: [id number] Date of Birth: Oct 31, 1955 Referring Provider (OT): Debbora Presto, NP   Encounter Date: 08/08/2021   OT End of Session - 08/08/21 1214     Visit Number 1    Number of Visits 17    Date for OT Re-Evaluation 10/03/21    Authorization Type Medicare/Medicaid    OT Start Time 1103    OT Stop Time 1149    OT Time Calculation (min) 46 min    Activity Tolerance Patient tolerated treatment well    Behavior During Therapy [facility] for tasks assessed/performed             Past Medical History:  Diagnosis Date   Anxiety 1995   Breast cancer ([place]) 2020   Left Breast Cancer   Cancer ([place])    breast left   Depression 1995   Family history of breast cancer    Family history of lung cancer    History of kidney stones    History of rheumatic fever as a child    Hypertension Dx Dec 2015   Panic attack 1995   Personal history of radiation therapy 2020   Left Breast Cancer   Tremor     Past Surgical History:  Procedure Laterality Date   APPENDECTOMY     bowel obstruction     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   BREAST LUMPECTOMY WITH RADIOACTIVE SEED LOCALIZATION Left 10/14/2018   Procedure: BREAST LUMPECTOMY WITH RADIOACTIVE SEED LOCALIZATION X2;  Surgeon: Erroll Luna, MD;  Location: [facility];  Service: General;  Laterality: Left;   CHOLECYSTECTOMY     COLONOSCOPY WITH PROPOFOL N/A 08/05/2018   Procedure: COLONOSCOPY WITH PROPOFOL;  Surgeon: Daneil Dolin, MD;  [facility];  Service: Endoscopy;  Laterality: N/A;  12:45pm   TONSILLECTOMY      There were no vitals filed for this visit.   Subjective Assessment - 08/08/21 1110     Subjective  Pt presents to OP OT with reports of frequent spilling of food and difficulty managing  utensils during self-feeding due to tremors.  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  Pt with R > L UE and LE tremors as well as head and voice tremors.  Pt reports sometimes her "nerves get to her" resulting in increased tremors.  Pt also reports decreased sleep with inability to fall or stay asleep.    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 No/denies               [facility] OT Assessment - 08/08/21 1112       Assessment   Medical Diagnosis essential tremor    Referring Provider (OT) Debbora Presto, NP    Hand Dominance Right    Next MD Visit 11/25/2021    Prior Therapy denies      Precautions   Precautions Fall      Restrictions   Weight Bearing Restrictions No      Balance Screen  Has the patient fallen in the past 6 months No    Has the patient had a decrease in activity level because of a fear of falling?  No    Is the patient reluctant to leave their home because of a fear of falling?  No      Home  Environment   Family/patient expects to be discharged to: Private residence    Living Arrangements Children   daughter, son in law, and 5 grandchildren   Available Help at Discharge Family    Type of [place]   ~5   [place] to live on main level with bedroom/bathroom    Bathroom Shower/Tub Tub/Shower unit    Hospital doctor None    Lives With Family      Prior Function   Level of Independence Independent;Independent with basic ADLs;Independent with homemaking with ambulation      ADL   Eating/Feeding Modified independent   increased time, impacted by tremors with frequent spilling   Tub/Shower  Transfer Modified independent   increased time   ADL comments Pt reports Mod I overall with bathing, dressing, and bathroom transfers.  Pt reports increased time and tremors but no AE or adaptive strategies.      IADL   Light Housekeeping Does personal laundry completely;Performs light daily tasks such as dishwashing, bed making    Meal Prep Able to complete simple cold meal and snack prep;Able to complete simple warm meal prep      Written Expression   Dominant Hand Right    Handwriting 90% legible;Increased time      Vision - History   Baseline Vision Wears glasses all the time      Vision Assessment   Eye Alignment Within Functional Limits    Ocular Range of Motion Within Functional Limits    Tracking/Visual Pursuits Able to track stimulus in all quads without difficulty      Cognition   Overall Cognitive Status Within Functional Limits for tasks assessed    Memory Impaired    Memory Impairment Decreased short term memory   reports "forgetting things"     Posture/Postural Control   Posture/Postural Control Postural limitations    Postural Limitations Forward head;Rounded Shoulders      Sensation   Light Touch Appears Intact      Coordination   Gross Motor Movements are Fluid and Coordinated No    Fine Motor Movements are Fluid and Coordinated No    Coordination and Movement Description tremors R > L with impairments with thumb opposition on R    Finger Nose Finger Test impaired R > L    9 Hole Peg Test Right;Left    Right 9 Hole Peg Test 38.7    Left 9 Hole Peg Test 36.2    Box and Blocks R: 38 and L: 39    Tremors R > L      ROM / Strength   AROM / PROM / Strength AROM;Strength      AROM   Overall AROM Comments WFL shoulder, elbow, wrist bilaterally      Strength   Overall Strength Comments grossly 4+/5, except 4-/5 elbow extension      Hand Function   Right Hand Grip (lbs) 43    Left Hand Grip (lbs) 57  OT  Education - 08/08/21 1214     Education Details Energy conservation, breathing techniques, stress management    Person(s) Educated Patient    Methods Explanation    Comprehension Verbalized understanding              OT Short Term Goals - 08/08/21 1222       OT SHORT TERM GOAL #1   Title Pt will be independent with HEP for improved fine motor control as needed for ADLs.    Time 4    Period Weeks    Status New    Target Date 09/05/21      OT SHORT TERM GOAL #2   Title Pt verbalize understanding of improved body mechanics and use of AE for improved success with self-care and self-feeding tasks.    Time 4    Period Weeks    Status New      OT SHORT TERM GOAL #3   Title Pt will be independent with breathing and relaxation strategies to improve sleep.    Time 4    Period Weeks    Status New               OT Long Term Goals - 08/08/21 1225       OT LONG TERM GOAL #1   Title Pt will complete 9 hole peg test on R  in </+ 36 seconds to demonstrate improved fine motor control as needed for self-care tasks.    Baseline 38.7    Time 8    Period Weeks    Status New    Target Date 10/03/21      OT LONG TERM GOAL #2   Title Pt will write a grocery list with 90% legibility and no reports of fatigue with adaptive equipment or strategy as needed.    Time 8    Period Weeks    Status New      OT LONG TERM GOAL #3   Title Pt will complete self-feeding with AE/adaptive utensils as needed with reports of decreased spillage.    Time 8    Period Weeks    Status New                   Plan - 08/08/21 1215     Clinical Impression Statement Pt is a 65 y/o female who presents to OP OT due to tremors impacting [place] as needed for self-feeding without spillage, handwriting, and sleep. Pt currently lives with daughter, son in law, and grandchildren in a house with ~5 setps to enter prior to onset. PMHx includes hx of breast cancer - DCIS left breast positive ER/PR treated  with lumpectomy and XRT, anxiety, HTN, kidney stones, depression and anxiety, PreDM. Pt will benefit from skilled occupational therapy services to address strength and coordination, balance, GM/FM control, cognition, safety awareness, introduction of compensatory strategies/AE prn, energy conservation strategies, and implementation of an HEP to improve participation and safety during ADLs and IADLs.    OT Occupational Profile and History Detailed Assessment- Review of Records and additional review of physical, cognitive, psychosocial history related to current functional performance    Occupational performance deficits (Please refer to evaluation for details): ADL's;IADL's;Rest and Sleep;Leisure    Body Structure / Function / Physical Skills ADL;Balance;Body mechanics;Coordination;FMC;Flexibility;Endurance;Dexterity;Decreased knowledge of use of DME;GMC;IADL;Mobility;ROM;Pain;Strength;UE functional use    Psychosocial Skills Environmental  Adaptations    Rehab Potential Good    Clinical Decision Making Limited treatment options, no task modification necessary  Comorbidities Affecting Occupational Performance: May have comorbidities impacting occupational performance    Modification or Assistance to Complete Evaluation  No modification of tasks or assist necessary to complete eval    OT Frequency 2x / week    OT Duration 8 weeks    OT Treatment/Interventions Self-care/ADL training;Biofeedback;Cryotherapy;Electrical Stimulation;Paraffin;Ultrasound;Moist Heat;Fluidtherapy;Therapeutic exercise;Neuromuscular education;Energy conservation;Manual Therapy;Functional Mobility Training;DME and/or AE instruction;Passive range of motion;Patient/family education;Cognitive remediation/compensation;Therapeutic activities;Balance training;Psychosocial skills training;Coping strategies training    Plan Focus on core strength, FMC/GMC, and relaxation strategies    Consulted and Agree with Plan of Care Patient              Patient will benefit from skilled therapeutic intervention in order to improve the following deficits and impairments:   Body Structure / Function / Physical Skills: ADL, Balance, Body mechanics, Coordination, FMC, Flexibility, Endurance, Dexterity, Decreased knowledge of use of DME, GMC, IADL, Mobility, ROM, Pain, Strength, UE functional use   Psychosocial Skills: Environmental  Adaptations   Visit Diagnosis: Muscle weakness (generalized) - Plan: Ot plan of care cert/re-cert  Other lack of coordination - Plan: Ot plan of care cert/re-cert  Unsteadiness on feet - Plan: Ot plan of care cert/re-cert    Problem List Patient Active Problem List   Diagnosis Date Noted   Tic disorder 03/22/2021   Current mild episode of major depressive disorder without prior episode ([place]) 03/22/2021   Prediabetes 04/19/2020   Muscle cramps 04/19/2020   Unintended weight loss 04/19/2020   History of rheumatic fever as a child 03/17/2019   Genetic testing 10/25/2018   Family history of breast cancer    Family history of lung cancer    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, [place], [place], [address] Phone: [phone]   Fax:  [phone]  Name: HAELIE CLAPP MRN: [id number] Date of Birth: 24-Oct-1955

## 2021-08-12 ENCOUNTER — Ambulatory Visit: Payer: Medicare Other | Admitting: Occupational Therapy

## 2021-08-12 ENCOUNTER — Other Ambulatory Visit: Payer: Self-pay

## 2021-08-12 ENCOUNTER — Encounter: Payer: Self-pay | Admitting: Occupational Therapy

## 2021-08-12 DIAGNOSIS — R278 Other lack of coordination: Secondary | ICD-10-CM

## 2021-08-12 DIAGNOSIS — R2681 Unsteadiness on feet: Secondary | ICD-10-CM

## 2021-08-12 DIAGNOSIS — M6281 Muscle weakness (generalized): Secondary | ICD-10-CM

## 2021-08-12 NOTE — Patient Instructions (Signed)
Access Code: [email or web address] URL: [email or web address] Date: 08/12/2021 Prepared by: Simonne Come  Exercises Seated Elbow Flexion with Self-Anchored Resistance - 2 x daily - 7 x weekly - 3 sets - 10 reps Seated Elbow Extension with Self-Anchored Resistance - 2 x daily - 7 x weekly - 3 sets - 10 reps Seated Shoulder Extension with Resistance - 2 x daily - 7 x weekly - 3 sets - 10 reps Seated Shoulder Abduction with Self-Anchored Resistance - 2 x daily - 7 x weekly - 3 sets - 10 reps Seated Shoulder Flexion with Resistance - 2 x daily - 7 x weekly - 3 sets - 10 reps Seated Overhead Press with Resistance - 2 x daily - 7 x weekly - 3 sets - 10 reps Seated Shoulder Horizontal Abduction with Resistance - 2 x daily - 7 x weekly - 3 sets - 10 reps Seated Single Shoulder PNF D2 with Resistance - 2 x daily - 7 x weekly - 3 sets - 10 reps

## 2021-08-12 NOTE — Therapy (Signed)
[facility] [address], [place], [place], [address] Phone: [phone]   Fax:  [phone]  Occupational Therapy Treatment  Patient Details  Name: Kimberly [name] MRN: [id number] Date of Birth: [birth year] Referring Provider (OT): Debbora Presto, NP   Encounter Date: 08/12/2021   OT End of Session - 08/12/21 1505     Visit Number 2    Number of Visits 17    Date for OT Re-Evaluation 10/03/21    Authorization Type Medicare/Medicaid    OT Start Time 3976    OT Stop Time 1448    OT Time Calculation (min) 43 min    Activity Tolerance Patient tolerated treatment well    Behavior During Therapy [facility] for tasks assessed/performed             Past Medical History:  Diagnosis Date   Anxiety 1995   Breast cancer ([place]) 2020   Left Breast Cancer   Cancer ([place])    breast left   Depression 1995   Family history of breast cancer    Family history of lung cancer    History of kidney stones    History of rheumatic fever as a child    Hypertension Dx Dec 2015   Panic attack 1995   Personal history of radiation therapy 2020   Left Breast Cancer   Tremor     Past Surgical History:  Procedure Laterality Date   APPENDECTOMY     bowel obstruction     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   BREAST LUMPECTOMY WITH RADIOACTIVE SEED LOCALIZATION Left 10/14/2018   Procedure: BREAST LUMPECTOMY WITH RADIOACTIVE SEED LOCALIZATION X2;  Surgeon: Erroll Luna, MD;  Location: [facility];  Service: General;  Laterality: Left;   CHOLECYSTECTOMY     COLONOSCOPY WITH PROPOFOL N/A 08/05/2018   Procedure: COLONOSCOPY WITH PROPOFOL;  Surgeon: Daneil Dolin, MD;  [facility];  Service: Endoscopy;  Laterality: N/A;  12:45pm   TONSILLECTOMY      There were no vitals filed for this visit.   Subjective Assessment - 08/12/21 1409     Subjective  Pt reports her "nerves" were all worked up due to weather and her ride then reports  attempting to use her breathing strategies from previous session.    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 No/denies               Therapist directed pt in core strengthening exercises in supine with focus on breathing and trunk control as needed for reducing tremors.  1 set of 10 bridging, hip rotation, and "boosting" with stepping pattern with mod cues for technique and core strength.    Therapist directed pt in placing and removing resistance clothespins (2, 4,6#) on vertical bar w/ RUE/LUE to facilitate functional overhead reach in sitting, increased [place], activity tolerance, and grip/pinch strength while focusing on compensatory strategies for tremors.  Discussed use of closed-chain movements to decrease tremors.  Provided pt with theraband HEP with focus on BUE strengthening.  Therapist providing min cues for technique and improved body mechanics during exercises.  Access Code: [email or web address] URL: [email or web address] Date: 08/12/2021 Prepared by: Simonne Come  Exercises Seated Elbow Flexion with Self-Anchored Resistance - 2 x daily - 7 x weekly -  3 sets - 10 reps Seated Elbow Extension with Self-Anchored Resistance - 2 x daily - 7 x weekly - 3 sets - 10 reps Seated Shoulder Extension with Resistance - 2 x daily - 7 x weekly - 3 sets - 10 reps Seated Shoulder Abduction with Self-Anchored Resistance - 2 x daily - 7 x weekly - 3 sets - 10 reps Seated Shoulder Flexion with Resistance - 2 x daily - 7 x weekly - 3 sets - 10 reps Seated Overhead Press with Resistance - 2 x daily - 7 x weekly - 3 sets - 10 reps Seated Shoulder Horizontal Abduction with Resistance - 2 x daily - 7 x weekly - 3 sets - 10 reps Seated Single Shoulder PNF D2 with Resistance - 2 x daily - 7 x  weekly - 3 sets - 10 reps                    OT Education - 08/12/21 1435     Education Details Breathing techniques, core strengthening, Theraband exercises with HEP    Person(s) Educated Patient    Methods Explanation;Demonstration    Comprehension Verbalized understanding              OT Short Term Goals - 08/12/21 1505       OT SHORT TERM GOAL #1   Title Pt will be independent with HEP for improved fine motor control as needed for ADLs.    Time 4    Period Weeks    Status On-going    Target Date 09/05/21      OT SHORT TERM GOAL #2   Title Pt verbalize understanding of improved body mechanics and use of AE for improved success with self-care and self-feeding tasks.    Time 4    Period Weeks    Status On-going      OT SHORT TERM GOAL #3   Title Pt will be independent with breathing and relaxation strategies to improve sleep.    Time 4    Period Weeks    Status On-going               OT Long Term Goals - 08/12/21 1506       OT LONG TERM GOAL #1   Title Pt will complete 9 hole peg test on R  in </+ 36 seconds to demonstrate improved fine motor control as needed for self-care tasks.    Baseline 38.7    Time 8    Period Weeks    Status On-going      OT LONG TERM GOAL #2   Title Pt will write a grocery list with 90% legibility and no reports of fatigue with adaptive equipment or strategy as needed.    Time 8    Period Weeks    Status On-going      OT LONG TERM GOAL #3   Title Pt will complete self-feeding with AE/adaptive utensils as needed with reports of decreased spillage.    Time 8    Period Weeks    Status On-going                   Plan - 08/12/21 1506     Clinical Impression Statement Pt is a 65 y/o female who presents to OP OT due to tremors impacting [place] as needed for self-feeding without spillage, handwriting, and sleep. Pt currently lives with daughter, son in law, and grandchildren in a house with ~5 setps to  enter  prior to onset. 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 Pt will benefit from skilled occupational therapy services to address strength and coordination, balance, GM/FM control, cognition, safety awareness, introduction of compensatory strategies/AE prn, energy conservation strategies, and implementation of an HEP to improve participation and safety during ADLs and IADLs.    OT Occupational Profile and History Detailed Assessment- Review of Records and additional review of physical, cognitive, psychosocial history related to current functional performance    Occupational performance deficits (Please refer to evaluation for details): ADL's;IADL's;Rest and Sleep;Leisure    Body Structure / Function / Physical Skills ADL;Balance;Body mechanics;Coordination;FMC;Flexibility;Endurance;Dexterity;Decreased knowledge of use of DME;GMC;IADL;Mobility;ROM;Pain;Strength;UE functional use    Psychosocial Skills Environmental  Adaptations    Rehab Potential Good    Clinical Decision Making Limited treatment options, no task modification necessary    Comorbidities Affecting Occupational Performance: May have comorbidities impacting occupational performance    Modification or Assistance to Complete Evaluation  No modification of tasks or assist necessary to complete eval    OT Frequency 2x / week    OT Duration 8 weeks    OT Treatment/Interventions Self-care/ADL training;Biofeedback;Cryotherapy;Electrical Stimulation;Paraffin;Ultrasound;Moist Heat;Fluidtherapy;Therapeutic exercise;Neuromuscular education;Energy conservation;Manual Therapy;Functional Mobility Training;DME and/or AE instruction;Passive range of motion;Patient/family education;Cognitive remediation/compensation;Therapeutic activities;Balance training;Psychosocial skills training;Coping strategies training    Plan Focus on core  strength, FMC/GMC, and relaxation strategies    Consulted and Agree with Plan of Care Patient             Patient will benefit from skilled therapeutic intervention in order to improve the following deficits and impairments:   Body Structure / Function / Physical Skills: ADL, Balance, Body mechanics, Coordination, FMC, Flexibility, Endurance, Dexterity, Decreased knowledge of use of DME, GMC, IADL, Mobility, ROM, Pain, Strength, UE functional use   Psychosocial Skills: Environmental  Adaptations   Visit Diagnosis: Muscle weakness (generalized)  Other lack of coordination  Unsteadiness on feet    Problem List Patient Active Problem List   Diagnosis Date Noted   Tic disorder 03/22/2021   Current mild episode of major depressive disorder without prior episode ([place]) 03/22/2021   Prediabetes 04/19/2020   Muscle cramps 04/19/2020   Unintended weight loss 04/19/2020   History of rheumatic fever as a child 03/17/2019   Genetic testing 10/25/2018   Family history of breast cancer    Family history of lung cancer    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, [place], [place], [address] Phone: [phone]   Fax:  [phone]  Name: JOI LEYVA MRN: [id number] Date of Birth: 16-Jul-1956

## 2021-08-14 ENCOUNTER — Ambulatory Visit: Payer: Medicare Other | Admitting: Occupational Therapy

## 2021-08-16 ENCOUNTER — Encounter: Payer: Self-pay | Admitting: Occupational Therapy

## 2021-08-16 ENCOUNTER — Other Ambulatory Visit: Payer: Self-pay

## 2021-08-16 ENCOUNTER — Ambulatory Visit: Payer: Medicare Other | Admitting: Occupational Therapy

## 2021-08-16 DIAGNOSIS — M6281 Muscle weakness (generalized): Secondary | ICD-10-CM | POA: Insufficient documentation

## 2021-08-16 DIAGNOSIS — Z79810 Long term (current) use of selective estrogen receptor modulators (SERMs): Secondary | ICD-10-CM | POA: Diagnosis not present

## 2021-08-16 DIAGNOSIS — R278 Other lack of coordination: Secondary | ICD-10-CM | POA: Diagnosis not present

## 2021-08-16 DIAGNOSIS — R2681 Unsteadiness on feet: Secondary | ICD-10-CM | POA: Insufficient documentation

## 2021-08-16 DIAGNOSIS — D0512 Intraductal carcinoma in situ of left breast: Secondary | ICD-10-CM | POA: Diagnosis present

## 2021-08-16 DIAGNOSIS — Z923 Personal history of irradiation: Secondary | ICD-10-CM | POA: Diagnosis not present

## 2021-08-16 DIAGNOSIS — R251 Tremor, unspecified: Secondary | ICD-10-CM | POA: Diagnosis not present

## 2021-08-16 NOTE — Patient Instructions (Signed)
  Ball exercises:  - rotate ball with fingertips in Right hand going to right and left and then forward and backwards  - toss ball from Right hand to Left hand  - toss ball with just Right hand    Strategies to reduce action tremors:  - Brace your elbows against your body. - Support your arms on the table or countertop. - Support your wrist with the opposite hand.    Homework:  - keep a log of stressors  - try to reduce stimulation before bed: tv, phone, etc  - sleep diary

## 2021-08-16 NOTE — Therapy (Signed)
[name] [address], [place], [place], [address] Phone: [phone]   Fax:  [phone]  Occupational Therapy Treatment  Patient Details  Name: Kimberly [name] MRN: [id number] Date of Birth: [birth year] Referring Provider (OT): Debbora Presto, NP   Encounter Date: 08/16/2021   OT End of Session - 08/16/21 1108     Visit Number 3    Number of Visits 17    Date for OT Re-Evaluation 10/03/21    Authorization Type Medicare/Medicaid    OT Start Time 1105    OT Stop Time 1145    OT Time Calculation (min) 40 min    Activity Tolerance Patient tolerated treatment well    Behavior During Therapy [facility] for tasks assessed/performed             Past Medical History:  Diagnosis Date   Anxiety 1995   Breast cancer ([place]) 2020   Left Breast Cancer   Cancer ([place])    breast left   Depression 1995   Family history of breast cancer    Family history of lung cancer    History of kidney stones    History of rheumatic fever as a child    Hypertension Dx Dec 2015   Panic attack 1995   Personal history of radiation therapy 2020   Left Breast Cancer   Tremor     Past Surgical History:  Procedure Laterality Date   APPENDECTOMY     bowel obstruction     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   BREAST LUMPECTOMY WITH RADIOACTIVE SEED LOCALIZATION Left 10/14/2018   Procedure: BREAST LUMPECTOMY WITH RADIOACTIVE SEED LOCALIZATION X2;  Surgeon: Erroll Luna, MD;  Location: [place] [facility];  Service: General;  Laterality: Left;   CHOLECYSTECTOMY     COLONOSCOPY WITH PROPOFOL N/A 08/05/2018   Procedure: COLONOSCOPY WITH PROPOFOL;  Surgeon: Daneil Dolin, MD;  [facility];  Service: Endoscopy;  Laterality: N/A;  12:45pm   TONSILLECTOMY      There were no vitals filed for this visit.   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 No/denies                Discussed sleep hygiene with turning off TV, phone, listening to music.  Encouraged pt to keep a log of stressors and sleep journal to increase awareness.  Pt reports concerns with writing items down, but reports she will attempt.  Therapist began to educate on phone adaptations with voice to text, but unsure pt aware of all features on phone.  Discussed guided imagery and progress muscle relaxation, having pt attempt each.    Engaged in dynamic standing balance and endurance while completing pipe tree puzzle with focus on endurance and reduced tremors. Noted pt with deceased head and RUE tremors while completing task in standing.  Therapist challenged pt with more fine motor control tasks as pt continues to voice difficulty with tremors with handwriting and self-feeding.  Engaged in [facility] with tennis sized ball with rotation in fingers progressing to ball tossing R to L and then to just in  R hand.  Pt with increased difficulty with tremors with single hand toss.  Therapist educated on proximity of arms to trunk and stabilization at elbow to decrease tremors.                      OT Short Term Goals - 08/12/21 1505       OT SHORT TERM GOAL #1   Title Pt will be independent with HEP for improved fine motor control as needed for ADLs.    Time 4    Period Weeks    Status On-going    Target Date 09/05/21      OT SHORT TERM GOAL #2   Title Pt verbalize understanding of improved body mechanics and use of AE for improved success with self-care and self-feeding tasks.    Time 4    Period Weeks    Status On-going      OT SHORT TERM GOAL #3   Title Pt will be independent with breathing and relaxation  strategies to improve sleep.    Time 4    Period Weeks    Status On-going               OT Long Term Goals - 08/12/21 1506       OT LONG TERM GOAL #1   Title Pt will complete 9 hole peg test on R  in </+ 36 seconds to demonstrate improved fine motor control as needed for self-care tasks.    Baseline 38.7    Time 8    Period Weeks    Status On-going      OT LONG TERM GOAL #2   Title Pt will write a grocery list with 90% legibility and no reports of fatigue with adaptive equipment or strategy as needed.    Time 8    Period Weeks    Status On-going      OT LONG TERM GOAL #3   Title Pt will complete self-feeding with AE/adaptive utensils as needed with reports of decreased spillage.    Time 8    Period Weeks    Status On-going                   Plan - 08/16/21 1206     Clinical Impression Statement Pt is a 65 y/o female who presents to OP OT due to tremors impacting [place] as needed for self-feeding without spillage, handwriting, and sleep. Pt currently lives with daughter, son in law, and grandchildren in a house with ~5 setps to enter prior to onset. 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 Pt will benefit from skilled occupational therapy services to address strength and coordination, balance, GM/FM control, cognition, safety awareness, introduction of compensatory strategies/AE prn, energy conservation strategies, and implementation of an HEP to improve participation and safety during ADLs and IADLs.    OT Occupational Profile and History Detailed Assessment- Review of Records and additional review of physical, cognitive, psychosocial history related to current functional performance    Occupational performance deficits (Please refer to evaluation for details): ADL's;IADL's;Rest and Sleep;Leisure    Body Structure / Function / Physical Skills  ADL;Balance;Body mechanics;Coordination;FMC;Flexibility;Endurance;Dexterity;Decreased knowledge of use of DME;GMC;IADL;Mobility;ROM;Pain;Strength;UE functional use    Psychosocial Skills Environmental  Adaptations    Rehab Potential Good    Clinical Decision Making Limited treatment options, no task modification necessary    Comorbidities Affecting  Occupational Performance: May have comorbidities impacting occupational performance    Modification or Assistance to Complete Evaluation  No modification of tasks or assist necessary to complete eval    OT Frequency 2x / week    OT Duration 8 weeks    OT Treatment/Interventions Self-care/ADL training;Biofeedback;Cryotherapy;Electrical Stimulation;Paraffin;Ultrasound;Moist Heat;Fluidtherapy;Therapeutic exercise;Neuromuscular education;Energy conservation;Manual Therapy;Functional Mobility Training;DME and/or AE instruction;Passive range of motion;Patient/family education;Cognitive remediation/compensation;Therapeutic activities;Balance training;Psychosocial skills training;Coping strategies training    Plan Focus on core strength, FMC/GMC, and relaxation strategies    Consulted and Agree with Plan of Care Patient             Patient will benefit from skilled therapeutic intervention in order to improve the following deficits and impairments:   Body Structure / Function / Physical Skills: ADL, Balance, Body mechanics, Coordination, FMC, Flexibility, Endurance, Dexterity, Decreased knowledge of use of DME, GMC, IADL, Mobility, ROM, Pain, Strength, UE functional use   Psychosocial Skills: Environmental  Adaptations   Visit Diagnosis: Muscle weakness (generalized)  Other lack of coordination  Unsteadiness on feet    Problem List Patient Active Problem List   Diagnosis Date Noted   Tic disorder 03/22/2021   Current mild episode of major depressive disorder without prior episode ([place]) 03/22/2021   Prediabetes 04/19/2020   Muscle cramps  04/19/2020   Unintended weight loss 04/19/2020   History of rheumatic fever as a child 03/17/2019   Genetic testing 10/25/2018   Family history of breast cancer    Family history of lung cancer    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, [place], [place], [address] Phone: [phone]   Fax:  [phone]  Name: TEYONNA PLAISTED MRN: [id number] Date of Birth: 21-May-1956

## 2021-08-19 ENCOUNTER — Other Ambulatory Visit: Payer: Self-pay | Admitting: Internal Medicine

## 2021-08-19 ENCOUNTER — Ambulatory Visit: Payer: Medicare Other | Admitting: Occupational Therapy

## 2021-08-19 ENCOUNTER — Encounter: Payer: Self-pay | Admitting: Occupational Therapy

## 2021-08-19 ENCOUNTER — Other Ambulatory Visit: Payer: Self-pay

## 2021-08-19 DIAGNOSIS — R278 Other lack of coordination: Secondary | ICD-10-CM

## 2021-08-19 DIAGNOSIS — Z1231 Encounter for screening mammogram for malignant neoplasm of breast: Secondary | ICD-10-CM

## 2021-08-19 DIAGNOSIS — M6281 Muscle weakness (generalized): Secondary | ICD-10-CM

## 2021-08-19 DIAGNOSIS — R2681 Unsteadiness on feet: Secondary | ICD-10-CM

## 2021-08-19 DIAGNOSIS — D0512 Intraductal carcinoma in situ of left breast: Secondary | ICD-10-CM | POA: Diagnosis not present

## 2021-08-19 NOTE — Therapy (Signed)
[facility] [address], [place], [place], [address] Phone: [phone]   Fax:  [phone]  Occupational Therapy Treatment  Patient Details  Name: MALALA TRENKAMP MRN: [id number] Date of Birth: [birth year] Referring Provider (OT): Debbora Presto, NP   Encounter Date: 08/19/2021   OT End of Session - 08/19/21 1203     Visit Number 4    Number of Visits 17    Date for OT Re-Evaluation 10/03/21    Authorization Type Medicare/Medicaid    OT Start Time 1148    OT Stop Time 1230    OT Time Calculation (min) 42 min    Activity Tolerance Patient tolerated treatment well    Behavior During Therapy [facility] for tasks assessed/performed             Past Medical History:  Diagnosis Date   Anxiety 1995   Breast cancer ([place]) 2020   Left Breast Cancer   Cancer ([place])    breast left   Depression 1995   Family history of breast cancer    Family history of lung cancer    History of kidney stones    History of rheumatic fever as a child    Hypertension Dx Dec 2015   Panic attack 1995   Personal history of radiation therapy 2020   Left Breast Cancer   Tremor     Past Surgical History:  Procedure Laterality Date   APPENDECTOMY     bowel obstruction     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   BREAST LUMPECTOMY WITH RADIOACTIVE SEED LOCALIZATION Left 10/14/2018   Procedure: BREAST LUMPECTOMY WITH RADIOACTIVE SEED LOCALIZATION X2;  Surgeon: Erroll Luna, MD;  Location: [facility];  Service: General;  Laterality: Left;   CHOLECYSTECTOMY     COLONOSCOPY WITH PROPOFOL N/A 08/05/2018   Procedure: COLONOSCOPY WITH PROPOFOL;  Surgeon: Daneil Dolin, MD;  [facility];  Service: Endoscopy;  Laterality: N/A;  12:45pm   TONSILLECTOMY      There were no vitals filed for this visit.   Subjective Assessment - 08/19/21 1150     Subjective  Pt reports continued use of breathing strategies and completing theraband exercises.     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 No/denies               Theraputty exercises with focus on finger strengthening as needed for handwriting.  Therapist providing min cues for technique to ensure proper completion and providing pt with HEP and adding additional explanations to increase carryover.  Encouraged use of theraputty at home in conjunction with other [facility] activities previously provided to continue to address strength and coordination as needed for handwriting.  Peg board pattern replication with focus on visual scanning, problem solving, awareness of errors, and hand strengthening to carryover to handwriting task.  Pt challenged to remove pegs from resistive foam board for strengthening.  Pt able to complete task with decreased head and UE tremors.     Tracing shapes with focus on improved motor control and attention to detail for pre-writing.  Pt reporting frustration with pre-writing task due to tremors and inability to stay on the lines.  Therapist educated on variety of writing utensils, recommending increased size and weight of pen  to decrease tremors and improve handwriting.  Pt demonstrating improvements with use of heavier and thicker pen.  Educated on breathing strategies both at rest to improve sleep as well as when reporting feeing that her "nerves are up".                     OT Short Term Goals - 08/12/21 1505       OT SHORT TERM GOAL #1   Title Pt will be independent with HEP for improved fine motor control as needed for ADLs.    Time 4    Period Weeks    Status On-going    Target Date 09/05/21      OT SHORT TERM GOAL #2   Title Pt verbalize understanding of improved body mechanics and use of AE for improved success with self-care and  self-feeding tasks.    Time 4    Period Weeks    Status On-going      OT SHORT TERM GOAL #3   Title Pt will be independent with breathing and relaxation strategies to improve sleep.    Time 4    Period Weeks    Status On-going               OT Long Term Goals - 08/12/21 1506       OT LONG TERM GOAL #1   Title Pt will complete 9 hole peg test on R  in </+ 36 seconds to demonstrate improved fine motor control as needed for self-care tasks.    Baseline 38.7    Time 8    Period Weeks    Status On-going      OT LONG TERM GOAL #2   Title Pt will write a grocery list with 90% legibility and no reports of fatigue with adaptive equipment or strategy as needed.    Time 8    Period Weeks    Status On-going      OT LONG TERM GOAL #3   Title Pt will complete self-feeding with AE/adaptive utensils as needed with reports of decreased spillage.    Time 8    Period Weeks    Status On-going                   Plan - 08/19/21 1206     Clinical Impression Statement Pt is continuing to demonstrate tremors with handwriting and [facility] tasks.  Focus is being placed on increased awareness of movements during tasks, compensatory strategies, and overall strengthening.  Pt reports use of breathing strategies at home, will continue to benefit from reiteration of use during activity as well as at rest.    OT Occupational Profile and History Detailed Assessment- Review of Records and additional review of physical, cognitive, psychosocial history related to current functional performance    Occupational performance deficits (Please refer to evaluation for details): ADL's;IADL's;Rest and Sleep;Leisure    Body Structure / Function / Physical Skills ADL;Balance;Body mechanics;Coordination;FMC;Flexibility;Endurance;Dexterity;Decreased knowledge of use of DME;GMC;IADL;Mobility;ROM;Pain;Strength;UE functional use    Psychosocial Skills Environmental  Adaptations    Rehab Potential Good    Clinical  Decision Making Limited treatment options, no task modification necessary    Comorbidities Affecting Occupational Performance: May have comorbidities impacting occupational performance    Modification or Assistance to Complete Evaluation  No modification of tasks or assist necessary to complete eval    OT Frequency 2x / week    OT Duration 8 weeks    OT Treatment/Interventions Self-care/ADL training;Biofeedback;Cryotherapy;Electrical Stimulation;Paraffin;Ultrasound;Moist  Heat;Fluidtherapy;Therapeutic exercise;Neuromuscular education;Energy conservation;Manual Therapy;Functional Mobility Training;DME and/or AE instruction;Passive range of motion;Patient/family education;Cognitive remediation/compensation;Therapeutic activities;Balance training;Psychosocial skills training;Coping strategies training    Plan Focus on core strength, FMC/GMC, and relaxation strategies    OT Home Exercise Plan Provided with HEP for theraputty and [facility] activities    Consulted and Agree with Plan of Care Patient             Patient will benefit from skilled therapeutic intervention in order to improve the following deficits and impairments:   Body Structure / Function / Physical Skills: ADL, Balance, Body mechanics, Coordination, FMC, Flexibility, Endurance, Dexterity, Decreased knowledge of use of DME, GMC, IADL, Mobility, ROM, Pain, Strength, UE functional use   Psychosocial Skills: Environmental  Adaptations   Visit Diagnosis: Muscle weakness (generalized)  Unsteadiness on feet  Other lack of coordination    Problem List Patient Active Problem List   Diagnosis Date Noted   Tic disorder 03/22/2021   Current mild episode of major depressive disorder without prior episode ([place]) 03/22/2021   Prediabetes 04/19/2020   Muscle cramps 04/19/2020   Unintended weight loss 04/19/2020   History of rheumatic fever as a child 03/17/2019   Genetic testing 10/25/2018   Family history of breast cancer    Family  history of lung cancer    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, [place], [place], [address] Phone: [phone]   Fax:  [phone]  Name: LAFONDA PATRON MRN: [id number] Date of Birth: 11-29-1955

## 2021-08-21 ENCOUNTER — Other Ambulatory Visit: Payer: Self-pay | Admitting: Internal Medicine

## 2021-08-21 DIAGNOSIS — I10 Essential (primary) hypertension: Secondary | ICD-10-CM

## 2021-08-21 NOTE — Telephone Encounter (Signed)
Requested Prescriptions  Pending Prescriptions Disp Refills  . amLODipine (NORVASC) 10 MG tablet [Pharmacy Med Name: AMLODIPINE BESYLATE 10MG  TABLETS] 90 tablet 1    Sig: TAKE 1 TABLET(10 MG) BY MOUTH DAILY     Cardiovascular:  Calcium Channel Blockers Passed - 08/21/2021  4:02 PM      Passed - Last BP in normal range    BP Readings from Last 1 Encounters:  08/02/21 124/80         Passed - Valid encounter within last 6 months    Recent Outpatient Visits          2 weeks ago Essential hypertension   [place], MD   5 months ago Essential hypertension   [place], MD   7 months ago Encounter for [employer] annual wellness exam   [place], RPH-CPP   9 months ago Essential hypertension   [place], MD   12 months ago Rash and nonspecific skin eruption   [place], MD      Future Appointments            In 3 months Wynetta Emery, Dalbert [place], MD [place]

## 2021-08-22 ENCOUNTER — Ambulatory Visit: Payer: Medicare Other | Admitting: Occupational Therapy

## 2021-08-23 ENCOUNTER — Other Ambulatory Visit: Payer: Self-pay | Admitting: Internal Medicine

## 2021-08-23 DIAGNOSIS — G252 Other specified forms of tremor: Secondary | ICD-10-CM

## 2021-08-23 DIAGNOSIS — I10 Essential (primary) hypertension: Secondary | ICD-10-CM

## 2021-08-23 NOTE — Telephone Encounter (Signed)
Requested Prescriptions  Pending Prescriptions Disp Refills  . atenolol (TENORMIN) 25 MG tablet [Pharmacy Med Name: ATENOLOL 25MG  TABLETS] 90 tablet 0    Sig: TAKE 1 TABLET(25 MG) BY MOUTH DAILY     Cardiovascular:  Beta Blockers Passed - 08/23/2021  7:04 AM      Passed - Last BP in normal range    BP Readings from Last 1 Encounters:  08/02/21 124/80         Passed - Last Heart Rate in normal range    Pulse Readings from Last 1 Encounters:  08/02/21 77         Passed - Valid encounter within last 6 months    Recent Outpatient Visits          3 weeks ago Essential hypertension   [place], MD   5 months ago Essential hypertension   [place], MD   7 months ago Encounter for [employer] annual wellness exam   [place], RPH-CPP   9 months ago Essential hypertension   [place], MD   12 months ago Rash and nonspecific skin eruption   [place], MD      Future Appointments            In 3 months Wynetta Emery, Dalbert Batman, MD [place]

## 2021-08-26 ENCOUNTER — Ambulatory Visit: Payer: Medicare Other | Admitting: Occupational Therapy

## 2021-08-26 ENCOUNTER — Encounter: Payer: Self-pay | Admitting: Occupational Therapy

## 2021-08-26 ENCOUNTER — Other Ambulatory Visit: Payer: Self-pay

## 2021-08-26 DIAGNOSIS — R2681 Unsteadiness on feet: Secondary | ICD-10-CM

## 2021-08-26 DIAGNOSIS — R278 Other lack of coordination: Secondary | ICD-10-CM

## 2021-08-26 DIAGNOSIS — D0512 Intraductal carcinoma in situ of left breast: Secondary | ICD-10-CM | POA: Diagnosis not present

## 2021-08-26 DIAGNOSIS — M6281 Muscle weakness (generalized): Secondary | ICD-10-CM

## 2021-08-26 NOTE — Patient Instructions (Signed)
  Coordination Activities  Perform the following activities for 5-10 minutes 1-2 times per day with both hand(s).  Flip cards 1 at a time as fast as you can. Rotate card in hand (clockwise and counter-clockwise). Shuffle cards. Pick up coins and stack. Pick up coins one at a time until you get 5-10 in your hand, then move coins from palm to fingertips to stack one at a time. Practice writing and/or typing.

## 2021-08-26 NOTE — Therapy (Signed)
[facility] [address], [place], [place], [address] Phone: [phone]   Fax:  [phone]  Occupational Therapy Treatment  Patient Details  Name: Kimberly [name] MRN: [id number] Date of Birth: 08-Aug-1956 Referring Provider (OT): Debbora Presto, NP   Encounter Date: 08/26/2021   OT End of Session - 08/26/21 1245     Visit Number 5    Number of Visits 17    Date for OT Re-Evaluation 10/03/21    Authorization Type Medicare/Medicaid    OT Start Time 1148    OT Stop Time 1230    OT Time Calculation (min) 42 min    Activity Tolerance Patient tolerated treatment well    Behavior During Therapy [facility] for tasks assessed/performed             Past Medical History:  Diagnosis Date   Anxiety 1995   Breast cancer ([place]) 2020   Left Breast Cancer   Cancer ([place])    breast left   Depression 1995   Family history of breast cancer    Family history of lung cancer    History of kidney stones    History of rheumatic fever as a child    Hypertension Dx Dec 2015   Panic attack 1995   Personal history of radiation therapy 2020   Left Breast Cancer   Tremor     Past Surgical History:  Procedure Laterality Date   APPENDECTOMY     bowel obstruction     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   BREAST LUMPECTOMY WITH RADIOACTIVE SEED LOCALIZATION Left 10/14/2018   Procedure: BREAST LUMPECTOMY WITH RADIOACTIVE SEED LOCALIZATION X2;  Surgeon: Erroll Luna, MD;  Location: [facility];  Service: General;  Laterality: Left;   CHOLECYSTECTOMY     COLONOSCOPY WITH PROPOFOL N/A 08/05/2018   Procedure: COLONOSCOPY WITH PROPOFOL;  Surgeon: Daneil Dolin, MD;  [facility];  Service: Endoscopy;  Laterality: N/A;  12:45pm   TONSILLECTOMY      There were no vitals filed for this visit.   Subjective Assessment - 08/26/21 1155     Subjective  Patient reports that she does feel the calming exercises are helping, but that she still has  disrupted sleep during the night.  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 No/denies                          OT Treatments/Exercises (OP) - 08/26/21 0001       ADLs   Eating Worked with adapted utensils for self feeding.  Patient did well with simulated eating.  Encouraged patient to maintain forearm on table during task, and to slow down.  Patient may benefit from built up foam for utensils.  Stabilizing arm on table was very helpful to reduce shaking in RUE.    Medication Management Patient not aware if taking medication to control tremors.  Upon review of medications she is currently prescribed primidone.    Leisure Patient is taking a vacation with her church group this week.  Used this session to have her write a packing  list.  Patint used pen issued last session - with larger foam grip, and was able to write larger (2 lines per item) to complete effective list.  Encouraged her to pick up a deck of cards as she enjoys playing cards and this is now part of her coordiantion exercise home program.      Neurological Re-education Exercises   Other Exercises 1 Established fine motor coordination exercises with patient.  See patient instructions.                    OT Education - 08/26/21 1244     Education Details fine motor coordination exercises    Person(s) Educated Patient    Methods Explanation;Demonstration;Tactile cues;Verbal cues    Comprehension Verbalized understanding;Returned demonstration              OT Short Term Goals - 08/26/21 1201       OT SHORT TERM GOAL #1   Title Pt will be independent with HEP for improved fine motor control as needed for  ADLs.    Time 4    Period Weeks    Status On-going    Target Date 09/05/21      OT SHORT TERM GOAL #2   Title Pt verbalize understanding of improved body mechanics and use of AE for improved success with self-care and self-feeding tasks.    Time 4    Period Weeks    Status On-going      OT SHORT TERM GOAL #3   Title Pt will be independent with breathing and relaxation strategies to improve sleep.    Time 4    Period Weeks    Status On-going               OT Long Term Goals - 08/26/21 1247       OT LONG TERM GOAL #1   Title Pt will complete 9 hole peg test on R  in </+ 36 seconds to demonstrate improved fine motor control as needed for self-care tasks.    Baseline 38.7    Time 8    Period Weeks    Status On-going      OT LONG TERM GOAL #2   Title Pt will write a grocery list with 90% legibility and no reports of fatigue with adaptive equipment or strategy as needed.    Time 8    Period Weeks    Status On-going      OT LONG TERM GOAL #3   Title Pt will complete self-feeding with AE/adaptive utensils as needed with reports of decreased spillage.    Time 8    Period Weeks    Status On-going                   Plan - 08/26/21 1245     Clinical Impression Statement Pt is continuing to benefit from OT services and she feels calming strategies have been most effective when she uses them.  She is still struggling with sleep - reporting active mind, and active household (teenage grandchildren)  Patient is responding well in clinic to strategies to address coordination in [place].    OT Occupational Profile and History Detailed Assessment- Review of Records and additional review of physical, cognitive, psychosocial history related to current functional performance    Occupational performance deficits (Please refer to evaluation for details): ADL's;IADL's;Rest and Sleep;Leisure    Body Structure / Function / Physical Skills ADL;Balance;Body  mechanics;Coordination;FMC;Flexibility;Endurance;Dexterity;Decreased knowledge of use of DME;GMC;IADL;Mobility;ROM;Pain;Strength;UE functional  use    Psychosocial Skills Environmental  Adaptations    Rehab Potential Good    Clinical Decision Making Limited treatment options, no task modification necessary    Comorbidities Affecting Occupational Performance: May have comorbidities impacting occupational performance    Modification or Assistance to Complete Evaluation  No modification of tasks or assist necessary to complete eval    OT Frequency 2x / week    OT Duration 8 weeks    OT Treatment/Interventions Self-care/ADL training;Biofeedback;Cryotherapy;Electrical Stimulation;Paraffin;Ultrasound;Moist Heat;Fluidtherapy;Therapeutic exercise;Neuromuscular education;Energy conservation;Manual Therapy;Functional Mobility Training;DME and/or AE instruction;Passive range of motion;Patient/family education;Cognitive remediation/compensation;Therapeutic activities;Balance training;Psychosocial skills training;Coping strategies training    Plan Focus on core strength, FMC/GMC, and relaxation strategies    OT Home Exercise Plan Provided with HEP for theraputty and [facility] activities    Consulted and Agree with Plan of Care Patient             Patient will benefit from skilled therapeutic intervention in order to improve the following deficits and impairments:   Body Structure / Function / Physical Skills: ADL, Balance, Body mechanics, Coordination, FMC, Flexibility, Endurance, Dexterity, Decreased knowledge of use of DME, GMC, IADL, Mobility, ROM, Pain, Strength, UE functional use   Psychosocial Skills: Environmental  Adaptations   Visit Diagnosis: Muscle weakness (generalized)  Unsteadiness on feet  Other lack of coordination    Problem List Patient Active Problem List   Diagnosis Date Noted   Tic disorder 03/22/2021   Current mild episode of major depressive disorder without prior episode  ([place]) 03/22/2021   Prediabetes 04/19/2020   Muscle cramps 04/19/2020   Unintended weight loss 04/19/2020   History of rheumatic fever as a child 03/17/2019   Genetic testing 10/25/2018   Family history of breast cancer    Family history of lung cancer    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, [place], [place], [address] Phone: [phone]   Fax:  [phone]  Name: Kimberly [name] MRN: [id number] Date of Birth: May 31, 1956

## 2021-09-02 ENCOUNTER — Encounter: Payer: Self-pay | Admitting: Occupational Therapy

## 2021-09-02 ENCOUNTER — Ambulatory Visit: Payer: Medicare Other | Admitting: Occupational Therapy

## 2021-09-02 ENCOUNTER — Telehealth: Payer: Self-pay | Admitting: Clinical

## 2021-09-02 ENCOUNTER — Other Ambulatory Visit: Payer: Self-pay

## 2021-09-02 DIAGNOSIS — R278 Other lack of coordination: Secondary | ICD-10-CM

## 2021-09-02 DIAGNOSIS — R2681 Unsteadiness on feet: Secondary | ICD-10-CM

## 2021-09-02 DIAGNOSIS — M6281 Muscle weakness (generalized): Secondary | ICD-10-CM

## 2021-09-02 DIAGNOSIS — D0512 Intraductal carcinoma in situ of left breast: Secondary | ICD-10-CM | POA: Diagnosis not present

## 2021-09-02 NOTE — Therapy (Signed)
[facility] [address] [place], [place], [address] Phone: [phone]   Fax:  [phone]  Occupational Therapy Treatment  Patient Details  Name: Kimberly [name] MRN: [id number] Date of Birth: [birth year] Referring Provider (OT): Debbora Presto, NP   Encounter Date: 09/02/2021   OT End of Session - 09/02/21 1110     Visit Number 6    Number of Visits 17    Date for OT Re-Evaluation 10/03/21    Authorization Type Medicare/Medicaid    OT Start Time 1105    OT Stop Time 1145    OT Time Calculation (min) 40 min    Activity Tolerance Patient tolerated treatment well    Behavior During Therapy [facility] for tasks assessed/performed             Past Medical History:  Diagnosis Date   Anxiety 1995   Breast cancer ([place]) 2020   Left Breast Cancer   Cancer ([place])    breast left   Depression 1995   Family history of breast cancer    Family history of lung cancer    History of kidney stones    History of rheumatic fever as a child    Hypertension Dx Dec 2015   Panic attack 1995   Personal history of radiation therapy 2020   Left Breast Cancer   Tremor     Past Surgical History:  Procedure Laterality Date   APPENDECTOMY     bowel obstruction     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   BREAST LUMPECTOMY WITH RADIOACTIVE SEED LOCALIZATION Left 10/14/2018   Procedure: BREAST LUMPECTOMY WITH RADIOACTIVE SEED LOCALIZATION X2;  Surgeon: Erroll Luna, MD;  Location: [facility];  Service: General;  Laterality: Left;   CHOLECYSTECTOMY     COLONOSCOPY WITH PROPOFOL N/A 08/05/2018   Procedure: COLONOSCOPY WITH PROPOFOL;  Surgeon: Daneil Dolin, MD;  [facility];  Service: Endoscopy;  Laterality: N/A;  12:45pm   TONSILLECTOMY      There were no vitals filed for this visit.   Subjective Assessment - 09/02/21 1107     Subjective  Patient reports that she had a great trip with her church fellowship group.  Pt reports  better sleep while on her trip, waking up more "refreshed".    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 No/denies                 FMC/GMC with resistive peg board with focus on coordination and compensatory strategies to decrease tremors.  Therapist providing min cues for problem solving during activity.  Pt demonstrating improved motor control with decreased tremors when stabilizing arm on table.  Handwriting activity with built up handle on pen.  Completed pencil paths with focus on stabilization of elbow on table for decreased tremors.  Increased challenge to writing 10 item grocery list with built up foam on pen.  Pt utilized skipping lines strategy from previous session and stabilization of forearm on table top during handwriting.  Therapist provided pt with built up tubing for utensils and provided pictures for alternative pen/pencil grips for purchase.  Therapist educated on [facility] tasks as previously provided.  Encouraging use of cards for relaxation (solitaire) as well as fine motor control.  Therapist  also providing education to "slow down" with handwriting and when texting or playing games on phone to allow for improved quality of movement.                      OT Short Term Goals - 08/26/21 1201       OT SHORT TERM GOAL #1   Title Pt will be independent with HEP for improved fine motor control as needed for ADLs.    Time 4    Period Weeks    Status On-going    Target Date 09/05/21      OT SHORT TERM GOAL #2   Title Pt verbalize understanding of improved body mechanics and use of AE for improved success with self-care and self-feeding tasks.    Time 4    Period Weeks    Status On-going      OT SHORT TERM GOAL #3   Title Pt will be independent with  breathing and relaxation strategies to improve sleep.    Time 4    Period Weeks    Status On-going               OT Long Term Goals - 08/26/21 1247       OT LONG TERM GOAL #1   Title Pt will complete 9 hole peg test on R  in </+ 36 seconds to demonstrate improved fine motor control as needed for self-care tasks.    Baseline 38.7    Time 8    Period Weeks    Status On-going      OT LONG TERM GOAL #2   Title Pt will write a grocery list with 90% legibility and no reports of fatigue with adaptive equipment or strategy as needed.    Time 8    Period Weeks    Status On-going      OT LONG TERM GOAL #3   Title Pt will complete self-feeding with AE/adaptive utensils as needed with reports of decreased spillage.    Time 8    Period Weeks    Status On-going                   Plan - 09/02/21 1456     Clinical Impression Statement Pt is continuing to benefit from OT services and she feels calming strategies have been most effective when she uses them.  She is still struggling with sleep - reporting active mind, and active household (teenage grandchildren) -pt reports that she has obtained a longer phone cord to allow herself to listen to music at night, but has yet to start.  Pt has been provided with built up handle for utensils to provide increased control with utensils with self-feeding.  Patient is responding well in clinic to strategies to address coordination in [place].    OT Occupational Profile and History Detailed Assessment- Review of Records and additional review of physical, cognitive, psychosocial history related to current functional performance    Occupational performance deficits (Please refer to evaluation for details): ADL's;IADL's;Rest and Sleep;Leisure    Body Structure / Function / Physical Skills ADL;Balance;Body mechanics;Coordination;FMC;Flexibility;Endurance;Dexterity;Decreased knowledge of use of DME;GMC;IADL;Mobility;ROM;Pain;Strength;UE functional use     Psychosocial Skills Environmental  Adaptations    Rehab Potential Good    Clinical Decision Making Limited treatment options, no task modification necessary    Comorbidities Affecting Occupational Performance: May have comorbidities impacting occupational performance    Modification or Assistance to Complete Evaluation  No modification of tasks or assist  necessary to complete eval    OT Frequency 2x / week    OT Duration 8 weeks    OT Treatment/Interventions Self-care/ADL training;Biofeedback;Cryotherapy;Electrical Stimulation;Paraffin;Ultrasound;Moist Heat;Fluidtherapy;Therapeutic exercise;Neuromuscular education;Energy conservation;Manual Therapy;Functional Mobility Training;DME and/or AE instruction;Passive range of motion;Patient/family education;Cognitive remediation/compensation;Therapeutic activities;Balance training;Psychosocial skills training;Coping strategies training    Plan Focus on core strength, FMC/GMC, and relaxation strategies    OT Home Exercise Plan Provided with HEP for theraputty and [facility] activities 11/14    Consulted and Agree with Plan of Care Patient             Patient will benefit from skilled therapeutic intervention in order to improve the following deficits and impairments:   Body Structure / Function / Physical Skills: ADL, Balance, Body mechanics, Coordination, FMC, Flexibility, Endurance, Dexterity, Decreased knowledge of use of DME, GMC, IADL, Mobility, ROM, Pain, Strength, UE functional use   Psychosocial Skills: Environmental  Adaptations   Visit Diagnosis: Muscle weakness (generalized)  Unsteadiness on feet  Other lack of coordination    Problem List Patient Active Problem List   Diagnosis Date Noted   Tic disorder 03/22/2021   Current mild episode of major depressive disorder without prior episode ([place]) 03/22/2021   Prediabetes 04/19/2020   Muscle cramps 04/19/2020   Unintended weight loss 04/19/2020   History of rheumatic fever as a  child 03/17/2019   Genetic testing 10/25/2018   Family history of breast cancer    Family history of lung cancer    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 [facility] [address] [place], [place], [address] Phone: [phone]   Fax:  [phone]  Name: Kimberly [name] MRN: [id number] Date of Birth: [birth year]

## 2021-09-04 ENCOUNTER — Ambulatory Visit: Payer: Medicare Other | Admitting: Occupational Therapy

## 2021-09-04 ENCOUNTER — Encounter: Payer: Self-pay | Admitting: Occupational Therapy

## 2021-09-04 ENCOUNTER — Other Ambulatory Visit: Payer: Self-pay

## 2021-09-04 DIAGNOSIS — R278 Other lack of coordination: Secondary | ICD-10-CM

## 2021-09-04 DIAGNOSIS — R2681 Unsteadiness on feet: Secondary | ICD-10-CM

## 2021-09-04 DIAGNOSIS — D0512 Intraductal carcinoma in situ of left breast: Secondary | ICD-10-CM | POA: Diagnosis not present

## 2021-09-04 DIAGNOSIS — M6281 Muscle weakness (generalized): Secondary | ICD-10-CM

## 2021-09-04 NOTE — Patient Instructions (Signed)
Fine Motor Coordination Exercises  Perform the following exercises 5-10 mins a day, as recommended by your occupational therapist.  Rotate ball in fingertips (clockwise and counter-clockwise). Toss ball between hands.   Add these to the coin exercises previously given

## 2021-09-04 NOTE — Therapy (Signed)
[facility] [address] [place], [place], [address] Phone: [phone]   Fax:  [phone]  Occupational Therapy Treatment  Patient Details  Name: Kimberly [name] MRN: [id number] Date of Birth: Nov 14, 1955 Referring Provider (OT): Debbora Presto, NP   Encounter Date: 09/04/2021   OT End of Session - 09/04/21 1104     Visit Number 7    Number of Visits 17    Date for OT Re-Evaluation 10/03/21    Authorization Type Medicare/Medicaid    OT Start Time 1100    OT Stop Time 1145    OT Time Calculation (min) 45 min    Activity Tolerance Patient tolerated treatment well    Behavior During Therapy [facility] for tasks assessed/performed             Past Medical History:  Diagnosis Date   Anxiety 1995   Breast cancer ([place]) 2020   Left Breast Cancer   Cancer ([place])    breast left   Depression 1995   Family history of breast cancer    Family history of lung cancer    History of kidney stones    History of rheumatic fever as a child    Hypertension Dx Dec 2015   Panic attack 1995   Personal history of radiation therapy 2020   Left Breast Cancer   Tremor     Past Surgical History:  Procedure Laterality Date   APPENDECTOMY     bowel obstruction     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   BREAST LUMPECTOMY WITH RADIOACTIVE SEED LOCALIZATION Left 10/14/2018   Procedure: BREAST LUMPECTOMY WITH RADIOACTIVE SEED LOCALIZATION X2;  Surgeon: Erroll Luna, MD;  Location: [place];  Service: General;  Laterality: Left;   CHOLECYSTECTOMY     COLONOSCOPY WITH PROPOFOL N/A 08/05/2018   Procedure: COLONOSCOPY WITH PROPOFOL;  Surgeon: Daneil Dolin, MD;  [facility];  Service: Endoscopy;  Laterality: N/A;  12:45pm   TONSILLECTOMY      There were no vitals filed for this visit.   Subjective Assessment - 09/04/21 1102     Subjective  Patient reports playing Solitaire with her new cards, stating "I couldn't win".  Pt reports  difficulty with shuffling cards as they are still stiff and new.    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 No/denies               [facility] task with small peg board while replicating pattern.  Noted improved coordination with placement of pegs when WB through elbow and forearm on table.  Coin activity with in-hand manipulation and translation when picking up and stacking coins.  Pt with improved motor control this session.  Therapist directed pt in [employer] solitaire with focus on motor control when shuffling, dealing, and completing novel card activity.  Pt required min cues for recall of sequence with novel card activity.  Therapist provided pt with written instructions which pt was able to utilize without additional cues.    FMC/GMC engaged in rotation of ball in hand clockwise and counter-clockwise for increased coordination and ball toss R to L hand.  Pt with good stabilization at core and bracing elbows to side to increase coordination during [facility] and [place] during ball toss.  OT Short Term Goals - 09/04/21 1105       OT SHORT TERM GOAL #1   Title Pt will be independent with HEP for improved fine motor control as needed for ADLs.   uses intermittenty, still requires min cues for proper technique   Time 4    Period Weeks    Status Partially Met    Target Date 09/05/21      OT SHORT TERM GOAL #2   Title Pt verbalize understanding of improved body mechanics and use of AE for improved success with self-care and self-feeding tasks.    Time 4    Period Weeks    Status Achieved      OT SHORT TERM GOAL #3   Title Pt will be independent with breathing and relaxation strategies to improve sleep.   reports noting improvements when uses breathing  strategies, but has not reported much improvement in sleep "has good nights and bad nights"   Time 4    Period Weeks    Status Partially Met               OT Long Term Goals - 08/26/21 1247       OT LONG TERM GOAL #1   Title Pt will complete 9 hole peg test on R  in </+ 36 seconds to demonstrate improved fine motor control as needed for self-care tasks.    Baseline 38.7    Time 8    Period Weeks    Status On-going      OT LONG TERM GOAL #2   Title Pt will write a grocery list with 90% legibility and no reports of fatigue with adaptive equipment or strategy as needed.    Time 8    Period Weeks    Status On-going      OT LONG TERM GOAL #3   Title Pt will complete self-feeding with AE/adaptive utensils as needed with reports of decreased spillage.    Time 8    Period Weeks    Status On-going                   Plan - 09/04/21 1208     Clinical Impression Statement Pt is continuing to benefit from OT services and she feels calming strategies have been most effective when she uses them.  She is still struggling with sleep - reporting active mind, and active household (teenage grandchildren) -pt reports unable to listen to music on phone but plans to obtain a radio to start music at night.  Pt reports not utilizing built up handle on utensil during feeding but feels sufficient with stablizing at elbow or wrist as needed.  Patient is responding well in clinic to strategies to address coordination in [place].    OT Occupational Profile and History Detailed Assessment- Review of Records and additional review of physical, cognitive, psychosocial history related to current functional performance    Occupational performance deficits (Please refer to evaluation for details): ADL's;IADL's;Rest and Sleep;Leisure    Body Structure / Function / Physical Skills ADL;Balance;Body mechanics;Coordination;FMC;Flexibility;Endurance;Dexterity;Decreased knowledge of use of  DME;GMC;IADL;Mobility;ROM;Pain;Strength;UE functional use    Psychosocial Skills Environmental  Adaptations    Rehab Potential Good    Clinical Decision Making Limited treatment options, no task modification necessary    Comorbidities Affecting Occupational Performance: May have comorbidities impacting occupational performance    Modification or Assistance to Complete Evaluation  No modification of tasks or assist necessary to complete eval    OT Frequency 2x /  week    OT Duration 8 weeks    OT Treatment/Interventions Self-care/ADL training;Biofeedback;Cryotherapy;Electrical Stimulation;Paraffin;Ultrasound;Moist Heat;Fluidtherapy;Therapeutic exercise;Neuromuscular education;Energy conservation;Manual Therapy;Functional Mobility Training;DME and/or AE instruction;Passive range of motion;Patient/family education;Cognitive remediation/compensation;Therapeutic activities;Balance training;Psychosocial skills training;Coping strategies training    Plan Focus on core strength, FMC/GMC, and relaxation strategies    OT Home Exercise Plan has theraband HEP, has theraputty HEP, and [place] activities    Consulted and Agree with Plan of Care Patient             Patient will benefit from skilled therapeutic intervention in order to improve the following deficits and impairments:   Body Structure / Function / Physical Skills: ADL, Balance, Body mechanics, Coordination, FMC, Flexibility, Endurance, Dexterity, Decreased knowledge of use of DME, GMC, IADL, Mobility, ROM, Pain, Strength, UE functional use   Psychosocial Skills: Environmental  Adaptations   Visit Diagnosis: Muscle weakness (generalized)  Unsteadiness on feet  Other lack of coordination    Problem List Patient Active Problem List   Diagnosis Date Noted   Tic disorder 03/22/2021   Current mild episode of major depressive disorder without prior episode ([place]) 03/22/2021   Prediabetes 04/19/2020   Muscle cramps 04/19/2020   Unintended  weight loss 04/19/2020   History of rheumatic fever as a child 03/17/2019   Genetic testing 10/25/2018   Family history of breast cancer    Family history of lung cancer    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, [place], [place], [address] Phone: [phone]   Fax:  [phone]  Name: Kimberly [name] MRN: [id number] Date of Birth: Jan 23, 1956

## 2021-09-10 NOTE — Progress Notes (Signed)
Patient Care Team: Ladell Pier, MD as PCP - General (Internal Medicine) Gala Romney Cristopher Estimable, MD as Consulting Physician (Gastroenterology) Erroll Luna, MD as Consulting Physician (General Surgery) Nicholas Lose, MD as Consulting Physician (Hematology and Oncology) Kyung Rudd, MD as Consulting Physician (Radiation Oncology)  DIAGNOSIS:    ICD-10-CM   1. Ductal carcinoma in situ (DCIS) of left breast  D05.12       SUMMARY OF ONCOLOGIC HISTORY: Oncology History  Ductal carcinoma in situ (DCIS) of left breast  09/07/2018 Initial Diagnosis   Bilateral masses and calcifications, right breast benign, left breast UOQ 3.2 cm biopsy revealed intermediate grade to high-grade DCIS ER 100%, PR 100%, 4 o'clock position retroareolar calcifications 2.2 cm biopsy revealed intermediate grade DCIS with [facility] ER 100%, PR 90%, both lesions are 5.5 cm apart, Tis NX stage 0   10/14/2018 Surgery   Left breast lumpectomy x2: 2 foci of DCIS intermediate grade 1.6 cm and 1.1 cm, LCIS, final margins negative.  The smaller DCIS 0.1 cm from inferior margin, ER 100%, PR 90%, Tis NX stage 0   10/27/2018 Cancer Staging   Staging form: Breast, AJCC 8th Edition - Pathologic: Stage 0 (pTis (DCIS), pN0, cM0, ER+, PR+) - Signed by Gardenia Phlegm, NP on 10/27/2018    11/23/2018 - 12/17/2018 Radiation Therapy   Adjuvant radiation   01/2019 -  Anti-estrogen oral therapy   Tamoxifen daily     CHIEF COMPLIANT: Follow-up of left breast DCIS on tamoxifen  INTERVAL HISTORY: Kimberly [name] is a 65 y.o. with above-mentioned history of  left breast DCIS treated with a lumpectomy x2, radiation, and is currently on tamoxifen. Mammogram on 09/14/2020 showed no evidence of malignancy bilaterally. She presents to the clinic today for follow-up.  She is tolerating tamoxifen extremely well without any problems or concerns.  Does not have any hot flashes or arthralgias or myalgias.  Patient with extensive tremors which  bother her.  She is seeing neurology for that.  ALLERGIES:  is allergic to codeine.  MEDICATIONS:  Current Outpatient Medications  Medication Sig Dispense Refill   amLODipine (NORVASC) 10 MG tablet TAKE 1 TABLET(10 MG) BY MOUTH DAILY 90 tablet 1   atenolol (TENORMIN) 25 MG tablet TAKE 1 TABLET(25 MG) BY MOUTH DAILY 90 tablet 0   Blood Pressure Monitor DEVI Use as directed to check home blood pressure 2-3 times a week 1 Device 0   busPIRone (BUSPAR) 15 MG tablet TAKE 1 TABLET(15 MG) BY MOUTH TWICE DAILY 180 tablet 0   ibuprofen (ADVIL,MOTRIN) 200 MG tablet Take 200 mg by mouth every 6 (six) hours as needed. (Patient not taking: Reported on 08/02/2021)     Multiple Vitamin (MULTIVITAMIN WITH MINERALS) TABS tablet Take 1 tablet by mouth daily.      primidone (MYSOLINE) 50 MG tablet Take 1 tablet (50 mg total) by mouth at bedtime. 30 tablet 6   tamoxifen (NOLVADEX) 20 MG tablet TAKE 1 TABLET(20 MG) BY MOUTH DAILY 90 tablet 3   tobramycin (TOBREX) 0.3 % ophthalmic solution Place 1 drop into the left eye every 4 (four) hours. 5 mL 0   traZODone (DESYREL) 50 MG tablet Take 1 1/2 tab PO QHS 45 tablet 6   No current facility-administered medications for this visit.    PHYSICAL EXAMINATION: ECOG PERFORMANCE STATUS: 1 - Symptomatic but completely ambulatory  Vitals:   09/11/21 1125  BP: 122/70  Pulse: 72  Resp: 18  Temp: (!) 97.2 F (36.2 C)  SpO2: 99%  Filed Weights   09/11/21 1125  Weight: 156 lb 9.6 oz (71 kg)    BREAST: No palpable masses or nodules in either right or left breasts. No palpable axillary supraclavicular or infraclavicular adenopathy no breast tenderness or nipple discharge. (exam performed in the presence of a chaperone)  LABORATORY DATA:  I have reviewed the data as listed CMP Latest Ref Rng & Units 11/22/2020 04/19/2020 12/20/2019  Glucose 65 - 99 mg/dL 86 97 202(H)  BUN 8 - 27 mg/dL 16 17 11   Creatinine 0.57 - 1.00 mg/dL 0.79 1.00 0.95  Sodium 134 - 144 mmol/L 140  141 139  Potassium 3.5 - 5.2 mmol/L 5.0 4.4 4.6  Chloride 96 - 106 mmol/L 104 105 102  CO2 20 - 29 mmol/L 16(L) 26 21  Calcium 8.7 - 10.3 mg/dL 10.0 10.0 9.9  Total Protein 6.0 - 8.5 g/dL 7.5 - 7.1  Total Bilirubin 0.0 - 1.2 mg/dL <0.2 - 0.2  Alkaline Phos 44 - 121 IU/L 64 - 63  AST 0 - 40 IU/L 23 - 14  ALT 0 - 32 IU/L 15 - 9    Lab Results  Component Value Date   WBC 6.4 04/19/2020   HGB 13.7 04/19/2020   HCT 40.2 04/19/2020   MCV 92 04/19/2020   PLT 396 04/19/2020   NEUTROABS 3.3 10/08/2018    ASSESSMENT & PLAN:  Ductal carcinoma in situ (DCIS) of left breast 10/14/2018: Left breast lumpectomy x2: 2 foci of DCIS intermediate grade 1.6 cm and 1.1 cm, LCIS, final margins negative.  The smaller DCIS 0.1 cm from inferior margin, ER 100%, PR 90%, Tis NX stage 0   Treatment plan: 1.  Adjuvant radiation therapy 11/23/2018-12/14/2018 2.  Followed by adjuvant antiestrogen therapy with tamoxifen x5 years   Tamoxifen toxicities:  Denies any hot flashes or myalgias.   Breast cancer surveillance: 1.  Breast exam 09/11/2021: Benign 2.  Mammogram 09/14/2020: Benign breast density category   I renewed her prescription for tamoxifen. Return to clinic in 1 year for follow-up    No orders of the defined types were placed in this encounter.  The patient has a good understanding of the overall plan. she agrees with it. she will call with any problems that may develop before the next visit here.  Total time spent: 20 mins including face to face time and time spent for planning, charting and coordination of care  Rulon Eisenmenger, MD, MPH 09/11/2021  I, Thana Ates, am acting as scribe for Dr. Nicholas Lose.  I have reviewed the above documentation for accuracy and completeness, and I agree with the above.

## 2021-09-11 ENCOUNTER — Inpatient Hospital Stay: Payer: Medicare Other | Attending: Hematology and Oncology | Admitting: Hematology and Oncology

## 2021-09-11 ENCOUNTER — Encounter: Payer: Self-pay | Admitting: Occupational Therapy

## 2021-09-11 ENCOUNTER — Other Ambulatory Visit: Payer: Self-pay

## 2021-09-11 ENCOUNTER — Ambulatory Visit: Payer: Medicare Other | Admitting: Occupational Therapy

## 2021-09-11 DIAGNOSIS — M6281 Muscle weakness (generalized): Secondary | ICD-10-CM

## 2021-09-11 DIAGNOSIS — R278 Other lack of coordination: Secondary | ICD-10-CM

## 2021-09-11 DIAGNOSIS — D0512 Intraductal carcinoma in situ of left breast: Secondary | ICD-10-CM | POA: Insufficient documentation

## 2021-09-11 DIAGNOSIS — Z923 Personal history of irradiation: Secondary | ICD-10-CM | POA: Insufficient documentation

## 2021-09-11 DIAGNOSIS — Z79810 Long term (current) use of selective estrogen receptor modulators (SERMs): Secondary | ICD-10-CM | POA: Insufficient documentation

## 2021-09-11 DIAGNOSIS — R2681 Unsteadiness on feet: Secondary | ICD-10-CM | POA: Insufficient documentation

## 2021-09-11 DIAGNOSIS — R251 Tremor, unspecified: Secondary | ICD-10-CM | POA: Insufficient documentation

## 2021-09-11 MED ORDER — TAMOXIFEN CITRATE 20 MG PO TABS: ORAL_TABLET | ORAL | 3 refills | Status: DC

## 2021-09-11 NOTE — Therapy (Signed)
[facility] [address] [place], [place], [address] Phone: [phone]   Fax:  [phone]  Occupational Therapy Treatment  Patient Details  Name: Kimberly [name] MRN: [id number] Date of Birth: 1956/02/22 Referring Provider (OT): Debbora Presto, NP   Encounter Date: 09/11/2021   OT End of Session - 09/11/21 1322     Visit Number 8    Number of Visits 17    Date for OT Re-Evaluation 10/03/21    Authorization Type Medicare/Medicaid    OT Start Time 0737    OT Stop Time 1062    OT Time Calculation (min) 45 min    Activity Tolerance Patient tolerated treatment well    Behavior During Therapy [facility] for tasks assessed/performed             Past Medical History:  Diagnosis Date   Anxiety 1995   Breast cancer ([place]) 2020   Left Breast Cancer   Cancer ([place])    breast left   Depression 1995   Family history of breast cancer    Family history of lung cancer    History of kidney stones    History of rheumatic fever as a child    Hypertension Dx Dec 2015   Panic attack 1995   Personal history of radiation therapy 2020   Left Breast Cancer   Tremor     Past Surgical History:  Procedure Laterality Date   APPENDECTOMY     bowel obstruction     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   BREAST LUMPECTOMY WITH RADIOACTIVE SEED LOCALIZATION Left 10/14/2018   Procedure: BREAST LUMPECTOMY WITH RADIOACTIVE SEED LOCALIZATION X2;  Surgeon: Erroll Luna, MD;  Location: [place];  Service: General;  Laterality: Left;   CHOLECYSTECTOMY     COLONOSCOPY WITH PROPOFOL N/A 08/05/2018   Procedure: COLONOSCOPY WITH PROPOFOL;  Surgeon: Daneil Dolin, MD;  [facility];  Service: Endoscopy;  Laterality: N/A;  12:45pm   TONSILLECTOMY      There were no vitals filed for this visit.   Subjective Assessment - 09/11/21 1319     Subjective  Patient reports playing the new Solitaire and having to reference her written instructions.     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 No/denies              [facility] with small peg board pattern replication with pt only dropping 2 pegs of 60 despite occasional tremors when picking up pegs.  Pt with decreased tremors when placing pegs in pegboard.  Completed 2nd peg board pattern with 0 drops of pegs across 70 pegs.  Handwriting grocery list of 10 items with focus on modified positioning and various types of pens.  Pt reports pen previously given by this therapist has run out of ink.  Trialed alternative pens with improved success with gel pen.  Pt with improved legibility and no reports of fatigue during writing this session.  [facility] tasks with in-hand manipulation and translation with picking up, stacking, and translating coins from palm to fingertips to place in container.  Pt reports completing theraputty and card HEP at home, but frequently forgetting to complete coin activities. Noted very mild tremors in hand with stacking and picking up from table.  Reiterated card [facility] activities as previously administered and added additional exercises, per pt instructions.                     OT Short Term Goals - 09/04/21 1105       OT SHORT TERM GOAL #1   Title Pt will be independent with HEP for improved fine motor control as needed for ADLs.   uses intermittenty, still requires min cues for proper technique   Time 4    Period Weeks    Status Partially Met    Target Date 09/05/21      OT SHORT TERM GOAL #2   Title Pt verbalize understanding of improved body mechanics and use of AE for improved success with self-care and self-feeding tasks.    Time 4    Period Weeks    Status Achieved      OT SHORT TERM GOAL #3   Title Pt will be independent with breathing and  relaxation strategies to improve sleep.   reports noting improvements when uses breathing strategies, but has not reported much improvement in sleep "has good nights and bad nights"   Time 4    Period Weeks    Status Partially Met               OT Long Term Goals - 08/26/21 1247       OT LONG TERM GOAL #1   Title Pt will complete 9 hole peg test on R  in </+ 36 seconds to demonstrate improved fine motor control as needed for self-care tasks.    Baseline 38.7    Time 8    Period Weeks    Status On-going      OT LONG TERM GOAL #2   Title Pt will write a grocery list with 90% legibility and no reports of fatigue with adaptive equipment or strategy as needed.    Time 8    Period Weeks    Status On-going      OT LONG TERM GOAL #3   Title Pt will complete self-feeding with AE/adaptive utensils as needed with reports of decreased spillage.    Time 8    Period Weeks    Status On-going                   Plan - 09/11/21 1323     Clinical Impression Statement Pt is continuing to benefit from OT services and she feels calming strategies have been most effective when she uses them. Pt states "I don't even know when I am sleeping or if I fall back asleep when I wake" but reports feeling more rested.  Pt reports not utilizing built up handle on utensil during feeding but feels sufficient with stablizing at elbow ~75% of time or wrist ~25% as needed.  Pt is demonstrating good carryover and usage of HEP.  Patient is responding well in clinic to strategies to address coordination in [place].    OT Occupational Profile and History Detailed Assessment- Review of Records and additional review of physical, cognitive, psychosocial history related to current functional performance    Occupational performance deficits (Please refer to evaluation for details): ADL's;IADL's;Rest and Sleep;Leisure    Body Structure / Function / Physical Skills ADL;Balance;Body  mechanics;Coordination;FMC;Flexibility;Endurance;Dexterity;Decreased knowledge of use of DME;GMC;IADL;Mobility;ROM;Pain;Strength;UE functional use    Psychosocial Skills Environmental  Adaptations    Rehab Potential Good    Clinical Decision Making Limited treatment options, no task modification necessary  Comorbidities Affecting Occupational Performance: May have comorbidities impacting occupational performance    Modification or Assistance to Complete Evaluation  No modification of tasks or assist necessary to complete eval    OT Frequency 2x / week    OT Duration 8 weeks    OT Treatment/Interventions Self-care/ADL training;Biofeedback;Cryotherapy;Electrical Stimulation;Paraffin;Ultrasound;Moist Heat;Fluidtherapy;Therapeutic exercise;Neuromuscular education;Energy conservation;Manual Therapy;Functional Mobility Training;DME and/or AE instruction;Passive range of motion;Patient/family education;Cognitive remediation/compensation;Therapeutic activities;Balance training;Psychosocial skills training;Coping strategies training    Plan Focus on core strength, FMC/GMC, and relaxation strategies    OT Home Exercise Plan has theraband HEP, has theraputty HEP, and [place] activities    Consulted and Agree with Plan of Care Patient             Patient will benefit from skilled therapeutic intervention in order to improve the following deficits and impairments:   Body Structure / Function / Physical Skills: ADL, Balance, Body mechanics, Coordination, FMC, Flexibility, Endurance, Dexterity, Decreased knowledge of use of DME, GMC, IADL, Mobility, ROM, Pain, Strength, UE functional use   Psychosocial Skills: Environmental  Adaptations   Visit Diagnosis: Muscle weakness (generalized)  Unsteadiness on feet  Other lack of coordination    Problem List Patient Active Problem List   Diagnosis Date Noted   Tic disorder 03/22/2021   Current mild episode of major depressive disorder without prior  episode ([place]) 03/22/2021   Prediabetes 04/19/2020   Muscle cramps 04/19/2020   Unintended weight loss 04/19/2020   History of rheumatic fever as a child 03/17/2019   Genetic testing 10/25/2018   Family history of breast cancer    Family history of lung cancer    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 [place], [place], [address] Phone: [phone]   Fax:  [phone]  Name: ERIYAH FERNANDO MRN: [id number] Date of Birth: 1955/12/19

## 2021-09-11 NOTE — Assessment & Plan Note (Signed)
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 The smaller DCIS 0.1 cm from inferior margin, ER 100%, PR 90%, Tis NX stage 0  Treatment plan: 1.Adjuvant radiation therapy2/08/2019-12/14/2018 2.Followed by adjuvant antiestrogen therapy with tamoxifen x5 years  Tamoxifentoxicities: Denies any hot flashes or myalgias.  Breast cancer surveillance: 1.Breast exam 09/11/2021: Benign 2.Mammogram 09/14/2020: Benign breast density category  I renewed her prescription for tamoxifen. Return to clinic in 1 year for follow-up

## 2021-09-11 NOTE — Patient Instructions (Signed)
  Coordination Activities  Perform the following activities for 5-10 minutes 1 times per day with right hand(s).  Flip cards 1 at a time as fast as you can. Deal cards with your thumb (Hold deck in hand and push card off top with thumb). Rotate card in hand (clockwise and counter-clockwise). 5x Shuffle cards. Play solitaire or other card games.

## 2021-09-16 ENCOUNTER — Institutional Professional Consult (permissible substitution): Payer: Medicare Other | Admitting: Clinical

## 2021-09-19 ENCOUNTER — Encounter: Payer: Self-pay | Admitting: Occupational Therapy

## 2021-09-20 ENCOUNTER — Ambulatory Visit
Admission: RE | Admit: 2021-09-20 | Discharge: 2021-09-20 | Disposition: A | Discharge status: Home / Self Care | Payer: Medicare Other | Source: Ambulatory Visit | Attending: Internal Medicine | Admitting: Internal Medicine

## 2021-09-20 ENCOUNTER — Encounter: Payer: Self-pay | Admitting: Occupational Therapy

## 2021-09-20 DIAGNOSIS — Z1231 Encounter for screening mammogram for malignant neoplasm of breast: Secondary | ICD-10-CM

## 2021-09-25 ENCOUNTER — Encounter: Payer: Self-pay | Admitting: Occupational Therapy

## 2021-09-25 ENCOUNTER — Other Ambulatory Visit: Payer: Self-pay

## 2021-09-25 ENCOUNTER — Ambulatory Visit: Payer: Medicare Other | Attending: Family Medicine | Admitting: Occupational Therapy

## 2021-09-25 DIAGNOSIS — M6281 Muscle weakness (generalized): Secondary | ICD-10-CM | POA: Insufficient documentation

## 2021-09-25 DIAGNOSIS — R278 Other lack of coordination: Secondary | ICD-10-CM | POA: Insufficient documentation

## 2021-09-25 DIAGNOSIS — R2681 Unsteadiness on feet: Secondary | ICD-10-CM | POA: Diagnosis present

## 2021-09-25 NOTE — Therapy (Signed)
[facility] [address], [place], [place], [address] Phone: [phone]   Fax:  [phone]  Occupational Therapy Treatment  Patient Details  Name: LAINEE LEHRMAN MRN: [id number] Date of Birth: 1956-08-14 Referring Provider (OT): Debbora Presto, NP   Encounter Date: 09/25/2021   OT End of Session - 09/25/21 1136     Visit Number 9    Number of Visits 17    Date for OT Re-Evaluation 10/03/21    Authorization Type Medicare/Medicaid    OT Start Time 1102    OT Stop Time 1147    OT Time Calculation (min) 45 min    Activity Tolerance Patient tolerated treatment well    Behavior During Therapy [facility] for tasks assessed/performed             Past Medical History:  Diagnosis Date   Anxiety 1995   Breast cancer ([place]) 2020   Left Breast Cancer   Cancer ([place])    breast left   Depression 1995   Family history of breast cancer    Family history of lung cancer    History of kidney stones    History of rheumatic fever as a child    Hypertension Dx Dec 2015   Panic attack 1995   Personal history of radiation therapy 2020   Left Breast Cancer   Tremor     Past Surgical History:  Procedure Laterality Date   APPENDECTOMY     bowel obstruction     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   BREAST LUMPECTOMY WITH RADIOACTIVE SEED LOCALIZATION Left 10/14/2018   Procedure: BREAST LUMPECTOMY WITH RADIOACTIVE SEED LOCALIZATION X2;  Surgeon: Erroll Luna, MD;  Location: [place];  Service: General;  Laterality: Left;   CHOLECYSTECTOMY     COLONOSCOPY WITH PROPOFOL N/A 08/05/2018   Procedure: COLONOSCOPY WITH PROPOFOL;  Surgeon: Daneil Dolin, MD;  [facility];  Service: Endoscopy;  Laterality: N/A;  12:45pm   TONSILLECTOMY      There were no vitals filed for this visit.   Subjective Assessment - 09/25/21 1108     Subjective  Pt reports doing her exercises with putty, theraband, and breathing exercises and is able to  identify improvements.    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 No/denies                          OT Treatments/Exercises (OP) - 09/25/21 0001       ADLs   Eating Educated on adaptive techniques, adaptive utensils, as well as compensatory strategies with elbow and wrist stabilization.  Pt reports not using built up foam for utensils but is satisfied with stabilization strategy with feeding.    Leisure Educated at [employer] about use of relaxation strategies for home with music, meditation/prayer, breathing strategies.  Therapist encouraging continued use of cards for leisure pursuits as she enjoys cards while allowing to focus on motor control.    ADL Comments Engaged in discussion about progress towards goals and upcoming d/c.  Discussed continued use of previously administered HEP, providing clarification for instructions on particular exercises to decrease tremors.  Encouraged pt to continue to use relaxation strategies and breathing exercises as well as focus on  decreasing stress.      Exercises   Exercises Shoulder;Elbow;Wrist   Engaged in 1# dumbbell exercises with focus on shoulder shrugs, press, flexion, rotation, abduction, elbow flexion, extension, wrist flexion, extension and stretching completing 1 set of 5 each exercise to ensure carryover for home.                   OT Education - 09/25/21 1230     Education Details Provided handouts about essential tremor, strategies to reduce tremors, relaxation strategies    Person(s) Educated Patient    Methods Explanation;Demonstration    Comprehension Verbalized understanding;Returned demonstration              OT Short Term Goals - 09/04/21 1105       OT SHORT TERM GOAL #1   Title Pt will be  independent with HEP for improved fine motor control as needed for ADLs.   uses intermittenty, still requires min cues for proper technique   Time 4    Period Weeks    Status Partially Met    Target Date 09/05/21      OT SHORT TERM GOAL #2   Title Pt verbalize understanding of improved body mechanics and use of AE for improved success with self-care and self-feeding tasks.    Time 4    Period Weeks    Status Achieved      OT SHORT TERM GOAL #3   Title Pt will be independent with breathing and relaxation strategies to improve sleep.   reports noting improvements when uses breathing strategies, but has not reported much improvement in sleep "has good nights and bad nights"   Time 4    Period Weeks    Status Partially Met               OT Long Term Goals - 09/25/21 1225       OT LONG TERM GOAL #1   Title Pt will complete 9 hole peg test on R  in </+ 36 seconds to demonstrate improved fine motor control as needed for self-care tasks.    Baseline 38.7    Time 8    Period Weeks    Status On-going      OT LONG TERM GOAL #2   Title Pt will write a grocery list with 90% legibility and no reports of fatigue with adaptive equipment or strategy as needed.    Time 8    Period Weeks    Status On-going      OT LONG TERM GOAL #3   Title Pt will complete self-feeding with AE/adaptive utensils as needed with reports of decreased spillage.    Time 8    Period Weeks    Status Achieved                   Plan - 09/25/21 1226     Clinical Impression Statement Pt is continuing to benefit from OT services and she feels calming strategies have been most effective when she uses them. Pt reports decreased spillage during feeding with stablizing at elbow ~75% of time or wrist ~25% as needed.  Pt is demonstrating good carryover and usage of HEP.  Patient is responding well in clinic to strategies to address coordination in [place].    OT Occupational Profile and History Detailed  Assessment- Review of Records and additional review of physical, cognitive, psychosocial history related to current functional performance    Occupational performance deficits (Please refer to evaluation for details):  ADL's;IADL's;Rest and Sleep;Leisure    Body Structure / Function / Physical Skills ADL;Balance;Body mechanics;Coordination;FMC;Flexibility;Endurance;Dexterity;Decreased knowledge of use of DME;GMC;IADL;Mobility;ROM;Pain;Strength;UE functional use    Psychosocial Skills Environmental  Adaptations    Rehab Potential Good    Clinical Decision Making Limited treatment options, no task modification necessary    Comorbidities Affecting Occupational Performance: May have comorbidities impacting occupational performance    Modification or Assistance to Complete Evaluation  No modification of tasks or assist necessary to complete eval    OT Frequency 2x / week    OT Duration 8 weeks    OT Treatment/Interventions Self-care/ADL training;Biofeedback;Cryotherapy;Electrical Stimulation;Paraffin;Ultrasound;Moist Heat;Fluidtherapy;Therapeutic exercise;Neuromuscular education;Energy conservation;Manual Therapy;Functional Mobility Training;DME and/or AE instruction;Passive range of motion;Patient/family education;Cognitive remediation/compensation;Therapeutic activities;Balance training;Psychosocial skills training;Coping strategies training    Plan check 9 hole peg goal and handwriting goal and plan to d/c    OT Home Exercise Plan UB strength and stretching exercises (from OT toolkit)    Consulted and Agree with Plan of Care Patient             Patient will benefit from skilled therapeutic intervention in order to improve the following deficits and impairments:   Body Structure / Function / Physical Skills: ADL, Balance, Body mechanics, Coordination, FMC, Flexibility, Endurance, Dexterity, Decreased knowledge of use of DME, GMC, IADL, Mobility, ROM, Pain, Strength, UE functional use    Psychosocial Skills: Environmental  Adaptations   Visit Diagnosis: Muscle weakness (generalized)  Unsteadiness on feet  Other lack of coordination    Problem List Patient Active Problem List   Diagnosis Date Noted   Tic disorder 03/22/2021   Current mild episode of major depressive disorder without prior episode ([place]) 03/22/2021   Prediabetes 04/19/2020   Muscle cramps 04/19/2020   Unintended weight loss 04/19/2020   History of rheumatic fever as a child 03/17/2019   Genetic testing 10/25/2018   Family history of breast cancer    Family history of lung cancer    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 [place], [place], [address] Phone: [phone]   Fax:  [phone]  Name: ARRYANA TOLLESON MRN: [id number] Date of Birth: [birth year]

## 2021-10-02 ENCOUNTER — Encounter: Payer: Self-pay | Admitting: Occupational Therapy

## 2021-10-02 ENCOUNTER — Other Ambulatory Visit: Payer: Self-pay

## 2021-10-02 ENCOUNTER — Ambulatory Visit: Payer: Medicare Other | Admitting: Occupational Therapy

## 2021-10-02 DIAGNOSIS — M6281 Muscle weakness (generalized): Secondary | ICD-10-CM

## 2021-10-02 DIAGNOSIS — R2681 Unsteadiness on feet: Secondary | ICD-10-CM

## 2021-10-02 DIAGNOSIS — R278 Other lack of coordination: Secondary | ICD-10-CM

## 2021-10-02 NOTE — Therapy (Signed)
[facility] [address], [place], [place], [address] Phone: [phone]   Fax:  [phone]  Occupational Therapy Treatment and Discharge  Patient Details  Name: Kimberly [name] MRN: [id number] Date of Birth: [birth year] Referring Provider (OT): Lomax, Amy, NP   OCCUPATIONAL THERAPY DISCHARGE SUMMARY  Visits from Start of Care: 10  Current functional level related to goals / functional outcomes: Pt has made great progress towards goals meeting 3 of 3 long term goals.  Pt is demonstrating improvements in RUE [facility] with improved outcomes with 9 hole peg test, improved legibility with handwriting, and verbalizing adaptive techniques and compensatory strategies with self-feeding.  Pt is independent in use of [place], theraband, and theraputty HEP and adaptive techniques and compensatory strategies for her tremors.   Remaining deficits: Pt continues to demonstrate intention tremors in RUE as well as head impacting [place] with self-feeding and handwriting as well as speech, however improvements with [place] and theraband HEP as well as educate on adaptations and relaxation strategies.   Education / Equipment: Pt has been educated on coordination and strengthening exercises for [facility] and [place] and adaptive techniques and compensate strategies for tremors.   Patient agrees to discharge. Patient goals were met. Patient is being discharged due to meeting the stated rehab goals.and demonstrating independence with HEP.     Encounter Date: 10/02/2021   OT End of Session - 10/02/21 1156     Visit Number 10    Number of Visits 17    Date for OT Re-Evaluation 10/03/21    Authorization Type Medicare/Medicaid    OT Start Time 1149    OT Stop Time 2505    OT Time Calculation (min) 40 min    Activity Tolerance Patient tolerated treatment well    Behavior During Therapy WFL for tasks assessed/performed             Past Medical History:  Diagnosis Date    Anxiety 1995   Breast cancer ([place]) 2020   Left Breast Cancer   Cancer ([place])    breast left   Depression 1995   Family history of breast cancer    Family history of lung cancer    History of kidney stones    History of rheumatic fever as a child    Hypertension Dx Dec 2015   Panic attack 1995   Personal history of radiation therapy 2020   Left Breast Cancer   Tremor     Past Surgical History:  Procedure Laterality Date   APPENDECTOMY     bowel obstruction     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   BREAST LUMPECTOMY WITH RADIOACTIVE SEED LOCALIZATION Left 10/14/2018   Procedure: BREAST LUMPECTOMY WITH RADIOACTIVE SEED LOCALIZATION X2;  Surgeon: Erroll Luna, MD;  Location: [facility];  Service: General;  Laterality: Left;   CHOLECYSTECTOMY     COLONOSCOPY WITH PROPOFOL N/A 08/05/2018   Procedure: COLONOSCOPY WITH PROPOFOL;  Surgeon: Daneil Dolin, MD;  [facility];  Service: Endoscopy;  Laterality: N/A;  12:45pm   TONSILLECTOMY      There were no vitals filed for this visit.   Subjective Assessment - 10/02/21 1155     Subjective  Pt reports sleeping better overall, when she remembers to use her relaxation strategies.    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 No/denies                [facility] OT Assessment - 10/02/21 0001       Coordination   Right 9 Hole Peg Test 29.9   improved from eval 38.7     Hand Function   Right Hand Grip (lbs) 48   improved form 43 on eval                     OT Treatments/Exercises (OP) - 10/02/21 0001       ADLs   Leisure Reiterated previous education about use of relaxation strategies for home with music, meditation/prayer, breathing strategies.  Educated on taking her time, coping strategies, and  breathing strategies when she begins to feel frustrated.    ADL Comments Discussed continued use of previously administered HEP, providing clarification for instructions on particular exercises to decrease tremors. Provided pt with green theraband for further resistance due to improved ability to complete exercises with red theraband. Encouraged pt to continue to use relaxation strategies and breathing exercises as well as focus on decreasing stress.                               OT Short Term Goals - 09/04/21 1105       OT SHORT TERM GOAL #1   Title Pt will be independent with HEP for improved fine motor control as needed for ADLs.   uses intermittenty, still requires min cues for proper technique   Time 4    Period Weeks    Status Partially Met    Target Date 09/05/21      OT SHORT TERM GOAL #2   Title Pt verbalize understanding of improved body mechanics and use of AE for improved success with self-care and self-feeding tasks.    Time 4    Period Weeks    Status Achieved      OT SHORT TERM GOAL #3   Title Pt will be independent with breathing and relaxation strategies to improve sleep.   reports noting improvements when uses breathing strategies, but has not reported much improvement in sleep "has good nights and bad nights"   Time 4    Period Weeks    Status Partially Met               OT Long Term Goals - 10/02/21 1203       OT LONG TERM GOAL #1   Title Pt will complete 9 hole peg test on R  in </+ 36 seconds to demonstrate improved fine motor control as needed for self-care tasks.    Baseline 38.7 on eval   improved to 29.9   Time 8    Period Weeks    Status Achieved      OT LONG TERM GOAL #2   Title Pt will write a grocery list with 90% legibility and no reports of fatigue with adaptive equipment or strategy as needed.    Time 8    Period Weeks    Status Achieved      OT LONG TERM GOAL #3   Title Pt will complete self-feeding with AE/adaptive  utensils as needed with reports of decreased spillage.    Time 8    Period Weeks    Status Achieved  Plan - 10/02/21 1225     Clinical Impression Statement Pt has made progress in ability to compensate for tremors during self-feeding, self-care tasks, and handwriting.  She feels calming strategies have been most effective when she remembers to use them. Pt reports decreased spillage during feeding with stablizing at elbow ~75% of time or wrist ~25% as needed.  Pt is demonstrating good carryover and usage of HEP.    OT Occupational Profile and History Detailed Assessment- Review of Records and additional review of physical, cognitive, psychosocial history related to current functional performance    Occupational performance deficits (Please refer to evaluation for details): ADL's;IADL's;Rest and Sleep;Leisure    Body Structure / Function / Physical Skills ADL;Balance;Body mechanics;Coordination;FMC;Flexibility;Endurance;Dexterity;Decreased knowledge of use of DME;GMC;IADL;Mobility;ROM;Pain;Strength;UE functional use    Psychosocial Skills Environmental  Adaptations    Rehab Potential Good    Clinical Decision Making Limited treatment options, no task modification necessary    Comorbidities Affecting Occupational Performance: May have comorbidities impacting occupational performance    Modification or Assistance to Complete Evaluation  No modification of tasks or assist necessary to complete eval    OT Frequency 2x / week    OT Duration 8 weeks    OT Treatment/Interventions Self-care/ADL training;Biofeedback;Cryotherapy;Electrical Stimulation;Paraffin;Ultrasound;Moist Heat;Fluidtherapy;Therapeutic exercise;Neuromuscular education;Energy conservation;Manual Therapy;Functional Mobility Training;DME and/or AE instruction;Passive range of motion;Patient/family education;Cognitive remediation/compensation;Therapeutic activities;Balance training;Psychosocial skills  training;Coping strategies training    Plan d/c from [place] --    Consulted and Agree with Plan of Care Patient             Patient will benefit from skilled therapeutic intervention in order to improve the following deficits and impairments:   Body Structure / Function / Physical Skills: ADL, Balance, Body mechanics, Coordination, FMC, Flexibility, Endurance, Dexterity, Decreased knowledge of use of DME, GMC, IADL, Mobility, ROM, Pain, Strength, UE functional use   Psychosocial Skills: Environmental  Adaptations   Visit Diagnosis: Muscle weakness (generalized)  Other lack of coordination  Unsteadiness on feet    Problem List Patient Active Problem List   Diagnosis Date Noted   Tic disorder 03/22/2021   Current mild episode of major depressive disorder without prior episode ([place]) 03/22/2021   Prediabetes 04/19/2020   Muscle cramps 04/19/2020   Unintended weight loss 04/19/2020   History of rheumatic fever as a child 03/17/2019   Genetic testing 10/25/2018   Family history of breast cancer    Family history of lung cancer    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, [place], [place], [address] Phone: [phone]   Fax:  [phone]  Name: TERESHA HANKS MRN: [id number] Date of Birth: 1956-04-02

## 2021-10-22 ENCOUNTER — Other Ambulatory Visit: Payer: Self-pay | Admitting: Internal Medicine

## 2021-10-22 DIAGNOSIS — F419 Anxiety disorder, unspecified: Secondary | ICD-10-CM

## 2021-10-22 NOTE — Telephone Encounter (Signed)
Requested medication (s) are due for refill today:   Provider to review  Requested medication (s) are on the active medication list:   Yes  Future visit scheduled:   Yes   Last ordered: Mysoline 03/22/2021 #30, 6 refills;   Buspar 07/24/2021 #180, 0 refills  Returned because this is a non delegated refill   Requested Prescriptions  Pending Prescriptions Disp Refills   primidone (MYSOLINE) 50 MG tablet [Pharmacy Med Name: PRIMIDONE 50MG  TABLETS] 30 tablet 6    Sig: TAKE 1 TABLET(50 MG) BY MOUTH AT BEDTIME     Not Delegated - Neurology:  Anticonvulsants Failed - 10/22/2021  7:07 AM      Failed - This refill cannot be delegated      Failed - HCT in normal range and within 360 days    Hematocrit  Date Value Ref Range Status  04/19/2020 40.2 34.0 - 46.6 % Final          Failed - HGB in normal range and within 360 days    Hemoglobin  Date Value Ref Range Status  04/19/2020 13.7 11.1 - 15.9 g/dL Final          Failed - PLT in normal range and within 360 days    Platelets  Date Value Ref Range Status  04/19/2020 396 150 - 450 x10E3/uL Final          Failed - WBC in normal range and within 360 days    WBC  Date Value Ref Range Status  04/19/2020 6.4 3.4 - 10.8 x10E3/uL Final  10/08/2018 6.1 4.0 - 10.5 K/uL Final          Passed - Valid encounter within last 12 months    Recent Outpatient Visits           2 months ago Essential hypertension   [place], Deborah B, MD   7 months ago Essential hypertension   [place], Deborah B, MD   9 months ago Encounter for [employer] annual wellness exam   [place], Jarome Matin, RPH-CPP   11 months ago Essential hypertension   [place], Deborah B, MD   1 year ago Rash and nonspecific skin eruption   [place], MD        Future Appointments             In 1 month Wyn[place] Emery, Dalbert Batman, MD [place]             busPIRone (BUSPAR) 15 MG tablet [Pharmacy Med Name: BUSPIRONE 15MG  TABLETS] 180 tablet 0    Sig: TAKE 1 TABLET(15 MG) BY MOUTH TWICE DAILY     Psychiatry: Anxiolytics/Hypnotics - Non-controlled Passed - 10/22/2021  7:07 AM      Passed - Valid encounter within last 6 months    Recent Outpatient Visits           2 months ago Essential hypertension   [place], MD   7 months ago Essential hypertension   [place], MD   9 months ago Encounter for [employer] annual wellness exam   [place], Jarome Matin, RPH-CPP   11 months ago Essential hypertension   [place], Neoma Laming  B, MD   1 year ago Rash and nonspecific skin eruption   [place], MD       Future Appointments             In 1 month Wynetta Emery, Dalbert Batman, MD [place]

## 2021-11-04 ENCOUNTER — Ambulatory Visit: Payer: Medicare Other | Attending: Internal Medicine | Admitting: Clinical

## 2021-11-04 ENCOUNTER — Other Ambulatory Visit: Payer: Self-pay

## 2021-11-04 DIAGNOSIS — F320 Major depressive disorder, single episode, mild: Secondary | ICD-10-CM

## 2021-11-04 DIAGNOSIS — F411 Generalized anxiety disorder: Secondary | ICD-10-CM
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
## 2021-11-15 NOTE — Telephone Encounter (Signed)
Closed

## 2021-11-21 ENCOUNTER — Other Ambulatory Visit: Payer: Self-pay | Admitting: Internal Medicine

## 2021-11-21 DIAGNOSIS — G252 Other specified forms of tremor: Secondary | ICD-10-CM

## 2021-11-21 DIAGNOSIS — I10 Essential (primary) hypertension: Secondary | ICD-10-CM

## 2021-11-21 NOTE — Telephone Encounter (Signed)
Requested Prescriptions  Pending Prescriptions Disp Refills   atenolol (TENORMIN) 25 MG tablet [Pharmacy Med Name: ATENOLOL 25MG  TABLETS] 90 tablet 0    Sig: TAKE 1 TABLET(25 MG) BY MOUTH DAILY     Cardiovascular: Beta Blockers 2 Failed - 11/21/2021  7:09 AM      Failed - Cr in normal range and within 360 days    Creatinine  Date Value Ref Range Status  09/15/2018 0.92 0.44 - 1.00 mg/dL Final   Creat  Date Value Ref Range Status  11/13/2016 0.98 0.50 - 0.99 mg/dL Final    Comment:      For patients > or = 68 years of age: The upper reference limit for Creatinine is approximately 13% higher for people identified as African-American.      Creatinine, Ser  Date Value Ref Range Status  11/22/2020 0.79 0.57 - 1.00 mg/dL Final         Passed - Last BP in normal range    BP Readings from Last 1 Encounters:  09/11/21 122/70         Passed - Last Heart Rate in normal range    Pulse Readings from Last 1 Encounters:  09/11/21 72         Passed - Valid encounter within last 6 months    Recent Outpatient Visits          3 months ago Essential hypertension   [place], MD   8 months ago Essential hypertension   [place], MD   10 months ago Encounter for [employer] annual wellness exam   [place], RPH-CPP   12 months ago Essential hypertension   [place], MD   1 year ago Rash and nonspecific skin eruption   [place], MD      Future Appointments            In 4 days Ladell Pier, MD [place]

## 2021-11-25 ENCOUNTER — Ambulatory Visit: Payer: Medicare Other | Attending: Internal Medicine | Admitting: Internal Medicine

## 2021-11-25 ENCOUNTER — Other Ambulatory Visit: Payer: Self-pay

## 2021-11-25 ENCOUNTER — Encounter: Payer: Self-pay | Admitting: Internal Medicine

## 2021-11-25 VITALS — BP 117/76 | HR 84 | Resp 16 | Wt 160.4 lb

## 2021-11-25 DIAGNOSIS — M25522 Pain in left elbow: Secondary | ICD-10-CM | POA: Diagnosis not present

## 2021-11-25 DIAGNOSIS — G250 Essential tremor: Secondary | ICD-10-CM | POA: Insufficient documentation

## 2021-11-25 DIAGNOSIS — M2550 Pain in unspecified joint: Secondary | ICD-10-CM | POA: Diagnosis not present

## 2021-11-25 DIAGNOSIS — Z79899 Other long term (current) drug therapy: Secondary | ICD-10-CM | POA: Diagnosis not present

## 2021-11-25 DIAGNOSIS — M25561 Pain in right knee: Secondary | ICD-10-CM | POA: Diagnosis not present

## 2021-11-25 DIAGNOSIS — F329 Major depressive disorder, single episode, unspecified: Secondary | ICD-10-CM | POA: Insufficient documentation

## 2021-11-25 DIAGNOSIS — Z923 Personal history of irradiation: Secondary | ICD-10-CM | POA: Diagnosis not present

## 2021-11-25 DIAGNOSIS — Z08 Encounter for follow-up examination after completed treatment for malignant neoplasm: Secondary | ICD-10-CM | POA: Diagnosis not present

## 2021-11-25 DIAGNOSIS — M25562 Pain in left knee: Secondary | ICD-10-CM | POA: Insufficient documentation

## 2021-11-25 DIAGNOSIS — F5104 Psychophysiologic insomnia: Secondary | ICD-10-CM | POA: Insufficient documentation

## 2021-11-25 DIAGNOSIS — R7303 Prediabetes: Secondary | ICD-10-CM | POA: Diagnosis not present

## 2021-11-25 DIAGNOSIS — M79646 Pain in unspecified finger(s): Secondary | ICD-10-CM | POA: Diagnosis not present

## 2021-11-25 DIAGNOSIS — Z853 Personal history of malignant neoplasm of breast: Secondary | ICD-10-CM | POA: Diagnosis not present

## 2021-11-25 DIAGNOSIS — F411 Generalized anxiety disorder: Secondary | ICD-10-CM | POA: Diagnosis not present

## 2021-11-25 DIAGNOSIS — F320 Major depressive disorder, single episode, mild: Secondary | ICD-10-CM

## 2021-11-25 DIAGNOSIS — I10 Essential (primary) hypertension: Secondary | ICD-10-CM | POA: Diagnosis not present

## 2021-11-25 DIAGNOSIS — M25521 Pain in right elbow: Secondary | ICD-10-CM | POA: Insufficient documentation

## 2021-11-25 DIAGNOSIS — F959 Tic disorder, unspecified: Secondary | ICD-10-CM | POA: Diagnosis not present

## 2021-11-25 DIAGNOSIS — R1313 Dysphagia, pharyngeal phase: Secondary | ICD-10-CM | POA: Insufficient documentation

## 2021-11-25 NOTE — Progress Notes (Signed)
Patient ID: Kimberly [name], female    DOB: 17-Sep-1956  MRN: [id number]  CC: Chronic disease management  Subjective: Kimberly [name] is a 66 y.o. 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  Intention tremor/tic: Patient has completed occupational therapy as was recommended by neurology.  She has found it helpful.  She was also referred to neurology at [facility] for second opinion but no appt or call as yet  C/o being "strangled" sometimes when she swallows foods.  Causes her to cough up small pieces of food.  Does not happen often.  Does not happen with liquids.  No unexplained wgh loss  C/o pain in knees, fingers, elbows since end of last yr.   She has not noticed in swelling.  Denies morning stiffness.  Denies any pain in knees with walking or prolong sitting.  No falls.   Does not take any pain med because  "it does not hurt me that bad." Goes up and down flight of stairs in her house several times a day  HTN: Compliant with atenolol and Norvasc.  She limits salt in the foods.  No chest pains or shortness of breath.  No lower extremity edema.  GAD/mild episode MDD:  still has problems sleeping but afraid of trying controlled substance.  She is thinking about trying Melatonin otc again.  Still taking the Trazodone and Buspar.  Reports being on Prozac for depression and found it helpful. Has meet with our LCSW last mth and has another appt coming up this wk.  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   Intention tremor 10/15/2017   Diabetes mellitus screening 06/10/2017   Breast nodule 03/02/2016   Seasonal allergies 01/07/2016   Right shoulder pain 09/28/2015   Chronic insomnia 07/06/2015   Tinea pedis of both feet 07/06/2015   Nocturia 08/25/2014   Chronic anxiety 08/25/2014   Heme positive stool 08/16/2014   HTN (hypertension) 08/16/2014     Current Outpatient Medications on File Prior to Visit  Medication Sig Dispense Refill   amLODipine (NORVASC) 10 MG tablet TAKE 1 TABLET(10 MG) BY MOUTH DAILY 90 tablet 1   atenolol (TENORMIN) 25 MG tablet TAKE 1 TABLET(25 MG) BY MOUTH DAILY 90 tablet 0   Blood Pressure Monitor DEVI Use as directed to check home blood pressure 2-3 times a week 1 Device 0   busPIRone (BUSPAR) 15 MG tablet TAKE 1 TABLET(15 MG) BY MOUTH TWICE DAILY 180 tablet 1   ibuprofen (ADVIL,MOTRIN) 200 MG tablet Take 200 mg by mouth every 6 (six) hours as needed. (Patient not taking: Reported on 08/02/2021)     Multiple Vitamin (MULTIVITAMIN WITH MINERALS) TABS tablet Take 1 tablet by mouth daily.      primidone (MYSOLINE) 50 MG tablet TAKE 1 TABLET(50 MG) BY MOUTH AT BEDTIME 30 tablet 6   tamoxifen (NOLVADEX) 20 MG tablet TAKE 1 TABLET(20 MG) BY  MOUTH DAILY 90 tablet 3   tobramycin (TOBREX) 0.3 % ophthalmic solution Place 1 drop into the left eye every 4 (four) hours. (Patient not taking: Reported on 11/25/2021) 5 mL 0   traZODone (DESYREL) 50 MG tablet Take 1 1/2 tab PO QHS 45 tablet 6   No current facility-administered medications on file prior to visit.    Allergies  Allergen Reactions   Codeine Other (See Comments)    Cold sweats    Social History   Socioeconomic History   Marital status: Single    Spouse name: Not on file   Number of children: Not on file   Years of education: Not on file   Highest education level: Not on file  Occupational  History   Not on file  Tobacco Use   Smoking status: Never   Smokeless tobacco: Never  Vaping Use   Vaping Use: Never used  Substance and Sexual Activity   Alcohol use: No    Alcohol/week: 0.0 standard drinks   Drug use: No   Sexual activity: Yes    Birth control/protection: Post-menopausal  Other Topics Concern   Not on file  Social History Narrative   Not on file   Social Determinants of Health   Financial Resource Strain: Low Risk    Difficulty of Paying Living Expenses: Not hard at all  Food Insecurity: No Food Insecurity   Worried About Charity fundraiser in the Last Year: Never true   Ran Out of Food in the Last Year: Never true  Transportation Needs: No Transportation Needs   Lack of Transportation (Medical): No   Lack of Transportation (Non-Medical): No  Physical Activity: Inactive   Days of Exercise per Week: 0 days   Minutes of Exercise per Session: 0 min  Stress: Stress Concern Present   Feeling of Stress : Rather much  Social Connections: Not on file  Intimate Partner Violence: Not At Risk   Fear of Current or Ex-Partner: No   Emotionally Abused: No   Physically Abused: No   Sexually Abused: No    Family History  Problem Relation Age of Onset   Breast cancer Mother 2   Cancer Maternal Grandmother        thinks it was braest, but not sure what type of cancer, dx >50   Heart disease Paternal Grandfather    Lung cancer Maternal Aunt    Colon cancer Neg Hx     Past Surgical History:  Procedure Laterality Date   APPENDECTOMY     bowel obstruction     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   BREAST LUMPECTOMY WITH RADIOACTIVE SEED LOCALIZATION Left 10/14/2018   Procedure: BREAST LUMPECTOMY WITH RADIOACTIVE SEED LOCALIZATION X2;  Surgeon: Erroll Luna, MD;  Location: [place];  Service: General;  Laterality: Left;   CHOLECYSTECTOMY     COLONOSCOPY WITH PROPOFOL N/A 08/05/2018   Procedure: COLONOSCOPY WITH PROPOFOL;  Surgeon: Daneil Dolin, MD;   [facility];  Service: Endoscopy;  Laterality: N/A;  12:45pm   TONSILLECTOMY      ROS: Review of Systems Negative except as stated above  PHYSICAL EXAM: BP 117/76    Pulse 84    Resp 16    Wt 160 lb 6.4 oz (72.8 kg)    SpO2 98%    BMI 27.53 kg/m   Wt Readings from Last 3 Encounters:  11/25/21 160 lb 6.4 oz (72.8 kg)  09/11/21 156 lb 9.6 oz (71 kg)  08/02/21 155  lb (70.3 kg)    Physical Exam  General appearance - alert, well appearing, and in no distress Mental status - normal mood, behavior, speech, dress, motor activity, and thought processes Neck - supple, no significant adenopathy Chest - clear to auscultation, no wheezes, rales or rhonchi, symmetric air entry Heart - normal rate, regular rhythm, normal S1, S2, no murmurs, rubs, clicks or gallops Musculoskeletal -no joint deformities or signs of active tenosynovitis of the hands and wrists.  She has good range of motion of the fingers and wrists.  No point tenderness on palpation of the elbows.  Good range of motion of the elbows without crepitus.  Knee joints: Joints are enlarged.  No point tenderness along the medial or lateral joint lines.  Good range of motion of without crepitus. Extremities -no lower extremity edema. Neuro: Patient with constant movement/tic of the head  Depression screen [facility] 2/9 11/04/2021 08/02/2021 03/22/2021  Decreased Interest 1 1 1   Down, Depressed, Hopeless 2 0 2  PHQ - 2 Score 3 1 3   Altered sleeping 3 - 3  Tired, decreased energy 0 - 0  Change in appetite 2 - 0  Feeling bad or failure about yourself  2 - 1  Trouble concentrating 2 - 1  Moving slowly or fidgety/restless 0 - 0  Suicidal thoughts 0 - 0  PHQ-9 Score 12 - 8  Difficult doing work/chores - - -  Some recent data might be hidden    CMP Latest Ref Rng & Units 11/22/2020 04/19/2020 12/20/2019  Glucose 65 - 99 mg/dL 86 97 202(H)  BUN 8 - 27 mg/dL 16 17 11   Creatinine 0.57 - 1.00 mg/dL 0.79 1.00 0.95  Sodium 134 - 144 mmol/L  140 141 139  Potassium 3.5 - 5.2 mmol/L 5.0 4.4 4.6  Chloride 96 - 106 mmol/L 104 105 102  CO2 20 - 29 mmol/L 16(L) 26 21  Calcium 8.7 - 10.3 mg/dL 10.0 10.0 9.9  Total Protein 6.0 - 8.5 g/dL 7.5 - 7.1  Total Bilirubin 0.0 - 1.2 mg/dL <0.2 - 0.2  Alkaline Phos 44 - 121 IU/L 64 - 63  AST 0 - 40 IU/L 23 - 14  ALT 0 - 32 IU/L 15 - 9   Lipid Panel     Component Value Date/Time   CHOL 152 11/22/2020 1430   TRIG 82 11/22/2020 1430   HDL 71 11/22/2020 1430   CHOLHDL 2.1 11/22/2020 1430   CHOLHDL 2.7 11/13/2016 1102   VLDL 25 11/13/2016 1102   LDLCALC 66 11/22/2020 1430    CBC    Component Value Date/Time   WBC 6.4 04/19/2020 1432   WBC 6.1 10/08/2018 0919   RBC 4.39 04/19/2020 1432   RBC 4.84 10/08/2018 0919   HGB 13.7 04/19/2020 1432   HCT 40.2 04/19/2020 1432   PLT 396 04/19/2020 1432   MCV 92 04/19/2020 1432   MCH 31.2 04/19/2020 1432   MCH 30.4 10/08/2018 0919   MCHC 34.1 04/19/2020 1432   MCHC 32.7 10/08/2018 0919   RDW 12.2 04/19/2020 1432   LYMPHSABS 1.9 10/08/2018 0919   MONOABS 0.6 10/08/2018 0919   EOSABS 0.2 10/08/2018 0919   BASOSABS 0.1 10/08/2018 0919    ASSESSMENT AND PLAN: 1. Essential hypertension At goal.  Continue Norvasc and atenolol. - CBC - Comprehensive metabolic panel - Lipid panel  2. Pharyngeal dysphagia Question of whether this may be related to her TIC causing sudden spasms in the throat/upper esophagus.  We will refer her to gastroenterology -  Ambulatory referral to Gastroenterology  3. Polyarthralgia No signs of inflammatory arthritis at this time by history and physical exam.  I recommend Tylenol as needed.  4. Current mild episode of major depressive disorder, unspecified whether recurrent ([place]) 5. Generalized anxiety disorder We discussed putting her back Prozac but patient wants to hold off for now and see how she does with continued counseling  6. Tic disorder Message sent to neurology NP letting her know that patient has not  received an appointment or call from [facility] neurology.  Hopefully she will be able to facilitate getting the appointment.  7. Chronic insomnia Patient afraid of trying Ambien or one of the other medications like Lunesta.  She plans to try melatonin over-the-counter again.    Patient was given the opportunity to ask questions.  Patient verbalized understanding of the plan and was able to repeat key elements of the plan.   Orders Placed This Encounter  Procedures   CBC   Comprehensive metabolic panel   Lipid panel   Ambulatory referral to Gastroenterology     Requested Prescriptions    No prescriptions requested or ordered in this encounter    Return in about 4 months (around 03/25/2022).  Karle Plumber, MD, FACP

## 2021-11-26 LAB — CBC
Hematocrit: 44.3 % (ref 34.0–46.6)
Hemoglobin: 14.8 g/dL (ref 11.1–15.9)
MCH: 30.4 pg (ref 26.6–33.0)
MCHC: 33.4 g/dL (ref 31.5–35.7)
MCV: 91 fL (ref 79–97)
Platelets: 426 10*3/uL (ref 150–450)
RBC: 4.87 x10E6/uL (ref 3.77–5.28)
RDW: 12.3 % (ref 11.7–15.4)
WBC: 8.3 10*3/uL (ref 3.4–10.8)

## 2021-11-26 LAB — COMPREHENSIVE METABOLIC PANEL
ALT: 12 IU/L (ref 0–32)
AST: 19 IU/L (ref 0–40)
Albumin/Globulin Ratio: 1.5 (ref 1.2–2.2)
Albumin: 4.6 g/dL (ref 3.8–4.8)
Alkaline Phosphatase: 67 IU/L (ref 44–121)
BUN/Creatinine Ratio: 17 (ref 12–28)
BUN: 13 mg/dL (ref 8–27)
Bilirubin Total: <0.2 mg/dL (ref 0.0–1.2)
CO2: 22 mmol/L (ref 20–29)
Calcium: 9.8 mg/dL (ref 8.7–10.3)
Chloride: 104 mmol/L (ref 96–106)
Creatinine, Ser: 0.78 mg/dL (ref 0.57–1.00)
Globulin, Total: 3.1 g/dL (ref 1.5–4.5)
Glucose: 81 mg/dL (ref 70–99)
Potassium: 5 mmol/L (ref 3.5–5.2)
Sodium: 140 mmol/L (ref 134–144)
Total Protein: 7.7 g/dL (ref 6.0–8.5)
eGFR: 83 mL/min/{1.73_m2} (ref >59)

## 2021-11-26 LAB — LIPID PANEL
Chol/HDL Ratio: 2.5 ratio (ref 0.0–4.4)
Cholesterol, Total: 185 mg/dL (ref 100–199)
HDL: 73 mg/dL (ref >39)
LDL Chol Calc (NIH): 86 mg/dL (ref 0–99)
Triglycerides: 154 mg/dL — ABNORMAL HIGH (ref 0–149)
VLDL Cholesterol Cal: 26 mg/dL (ref 5–40)

## 2021-11-27 ENCOUNTER — Ambulatory Visit: Payer: Medicare Other | Admitting: Clinical

## 2021-11-27 ENCOUNTER — Telehealth: Payer: Self-pay

## 2021-11-27 ENCOUNTER — Telehealth: Payer: Self-pay | Admitting: Internal Medicine

## 2021-11-27 ENCOUNTER — Ambulatory Visit: Payer: Medicare Other | Attending: Internal Medicine | Admitting: Clinical

## 2021-11-27 DIAGNOSIS — F320 Major depressive disorder, single episode, mild: Secondary | ICD-10-CM

## 2021-11-27 DIAGNOSIS — F411 Generalized anxiety disorder: Secondary | ICD-10-CM | POA: Diagnosis not present

## 2021-11-27 NOTE — Telephone Encounter (Signed)
Contacted pt to go over lab results pt is aware and doesn't have any questions or concerns 

## 2021-11-27 NOTE — Telephone Encounter (Signed)
-----   Message from Mordecai Rasmussen sent at 11/26/2021  9:31 AM EST ----- Regarding: RE: [facility] referral I checked the [facility] portal and they mailed a letter to the patient on 08/21/21 that they have attempted to contact the patient to schedule but they have not been able to reach the patient to make the appointment. : Here is a copy of the letter from [facility].. I have called the patient and informed her that they have tried to reach out to her to schedule and I gave her their number of [phone] to call to schedule.   August 21, 2021   [place] [address]       Dear Meryl Crutch: Your health and well-being are important to [place]. At the request of your provider, we have attempted to contact you over the phone to schedule an appointment with Neurology - 4th fl [facility] but have been unable to reach you to make this appointment.     If you have any questions regarding this referral, please contact your current provider. If you would like to schedule an appointment, please contact our office at [phone].   Thank you,   [facility] Department of Neurology - 4th fl Mike Craze  Scheduling Team ----- Message ----- From: Debbora Presto, NP Sent: 11/25/2021   2:29 PM EST To: Donavan Burnet, MD Subject: RE: [facility] referral                               Thank you so much for letting us know. It looks like the referral to [facility] and OT was sent over back in October but I do not see where she was scheduled. I will ask our staff to check on this for her. I do see that she is in with OT! I will look forward to seeing her in follow up. Please let me know if there is anything else we can help with.   Amy  ----- Message ----- From: Ladell Pier, MD Sent: 11/25/2021   1:49 PM EST To: Debbora Presto, NP Subject: [facility] referral                                   On last visit with this pt you referred her to Neurology at [facility] for second  opinion.  I saw her today.  She tells me that she has not received an appointment or call as yet.
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
## 2021-12-06 ENCOUNTER — Other Ambulatory Visit: Payer: Self-pay | Admitting: Hematology and Oncology

## 2021-12-21 ENCOUNTER — Other Ambulatory Visit: Payer: Self-pay | Admitting: Internal Medicine

## 2021-12-21 DIAGNOSIS — I10 Essential (primary) hypertension: Secondary | ICD-10-CM

## 2021-12-23 ENCOUNTER — Other Ambulatory Visit: Payer: Self-pay

## 2021-12-23 ENCOUNTER — Ambulatory Visit: Payer: Medicare Other | Attending: Internal Medicine | Admitting: Clinical

## 2021-12-23 DIAGNOSIS — F320 Major depressive disorder, single episode, mild: Secondary | ICD-10-CM

## 2021-12-23 DIAGNOSIS — F411 Generalized anxiety disorder: Secondary | ICD-10-CM | POA: Diagnosis not present

## 2021-12-23 NOTE — Telephone Encounter (Signed)
Refilled 08/21/2021 #90 1 refill - pt should have enough to last until 02/18/2022. ?Requested Prescriptions  ?Pending Prescriptions Disp Refills  ?? amLODipine (NORVASC) 10 MG tablet [Pharmacy Med Name: AMLODIPINE BESYLATE '10MG'$  TABLETS] 90 tablet 1  ?  Sig: TAKE 1 TABLET(10 MG) BY MOUTH DAILY  ?  ? Cardiovascular: Calcium Channel Blockers 2 Passed - 12/21/2021  7:04 AM  ?  ?  Passed - Last BP in normal range  ?  BP Readings from Last 1 Encounters:  ?11/25/21 117/76  ?   ?  ?  Passed - Last Heart Rate in normal range  ?  Pulse Readings from Last 1 Encounters:  ?11/25/21 84  ?   ?  ?  Passed - Valid encounter within last 6 months  ?  Recent Outpatient Visits   ?      ? 4 weeks ago Essential hypertension  ? [place] Ladell Pier, MD  ? 4 months ago Essential hypertension  ? [place] Ladell Pier, MD  ? 9 months ago Essential hypertension  ? [place] Ladell Pier, MD  ? 11 months ago Encounter for [employer] annual wellness exam  ? [place], RPH-CPP  ? 1 year ago Essential hypertension  ? [place] Ladell Pier, MD  ?  ?  ?Future Appointments   ?        ? In 3 months Ladell Pier, MD [place]  ?  ? ?  ?  ?  ? ?

## 2021-12-23 NOTE — BH Specialist Note (Addendum)
Integrated Behavioral Health Follow Up In-Person Visit ? ?MRN: [id number] ?Name: Kimberly [name] ? ?Number of [place] Clinician visits: 3- Third Visit ? ?Session Start time: 8099 ?  ?Session End time: 8338 ? ?Total time in minutes: 40 ? ? ?Types of Service: Individual psychotherapy ? ?Interpretor:No. Interpretor Name and Language: N/A ? ?Subjective: ?Kimberly [name] is a 66 y.o. female accompanied by  self ?Patient was referred by Karle Plumber, MD for depression and anxiety. ?Patient reports the following symptoms/concerns: Reports feeling anxious and worrying. Reports that she has noticed an increase in her irritability. Reports that she continues to have trouble sleeping as she worries excessively. ?Duration of problem: 1+ year; Severity of problem: moderate ? ?Objective: ?Mood: Anxious and Affect: Appropriate ?Risk of harm to self or others: No plan to harm self or others ? ?Life Context: ?Family and Social: Pt receives support from daughter who she currently lives with. ?School/Work: Pt is unemployed and receives Fish farm manager.  ?Self-Care: Denies substance use. Pt is currently taking buspirone and trazodone.  ?Life Changes: Pt experiences physical health problems and believes her tics and tremors have worsened overtime. Pt is followed by neurology.  ?(No changes to life context) ? ?Patient and/or Family's Strengths/Protective Factors: ?Social and Emotional competence and Concrete supports in place (healthy food, safe environments, etc.) ? ?Goals Addressed: ?Patient will: ? Reduce symptoms of: anxiety and depression  ? Increase knowledge and/or ability of: coping skills  ? Demonstrate ability to: Increase healthy adjustment to current life circumstances ? ?Progress towards Goals: ?Ongoing ? ?Interventions: ?Interventions utilized:  Optician, dispensing, CBT Cognitive Behavioral Therapy, and Supportive Counseling ?Standardized Assessments completed: GAD-7 and PHQ 9 ?GAD 7  : Generalized Anxiety Score 12/23/2021 11/04/2021 08/02/2021 03/22/2021  ?Nervous, Anxious, on Edge '3 2 3 2  '$ ?Control/stop worrying '3 3 3 2  '$ ?Worry too much - different things '3 3 3 2  '$ ?Trouble relaxing 0 '2 2 1  '$ ?Restless 0 0 1 0  ?Easily annoyed or irritable '3 2 3 2  '$ ?Afraid - awful might happen 0 0 0 1  ?Total GAD 7 Score '12 12 15 10  '$ ? ?Depression screen [facility] 2/9 12/23/2021 11/04/2021 08/02/2021 03/22/2021 01/02/2021  ?Decreased Interest 0 '1 1 1 2  '$ ?Down, Depressed, Hopeless 2 2 0 2 3  ?PHQ - 2 Score '2 3 1 3 5  '$ ?Altered sleeping 3 3 - 3 3  ?Tired, decreased energy 1 0 - 0 0  ?Change in appetite 0 2 - 0 1  ?Feeling bad or failure about yourself  1 2 - 1 0  ?Trouble concentrating 2 2 - 1 0  ?Moving slowly or fidgety/restless 0 0 - 0 0  ?Suicidal thoughts 0 0 - 0 0  ?PHQ-9 Score 9 12 - 8 9  ?Difficult doing work/chores - - - - Somewhat difficult  ?Some recent data might be hidden  ?Marland Kitchen ?Patient and/or Family Response: Pt receptive to tx. Pt receptive to psychoeducation provided on anxiety and irritability. Pt receptive to cognitive restructuring to decrease negative thoughts and assisted with cognitive processing skills. Pt will continue deep breathing and meditation.  ? ?Patient Centered Plan: ?Patient is on the following Treatment Plan(s): Depression and anxiety ? ?Assessment: ?Denies SI/HI. Patient currently experiencing anxiety and mild depression. Pt's irritability appears to be increasing due to anxiety. Pt continues to have difficulty with cognitive processing skills. Pt has therapy appt for 01/13/22 with [facility]. ? ?Patient may benefit from utilizing meditation. LCSW provided psychoeducation  on anxiety and irritability. LCSW utilized cognitive restructuring to decrease negative thoughts and assisted with cognitive processing skills. LCSW encouraged pt to utilize deep breathing and meditation. LCSW will fu with pt additionally to ensure therapy services have been connected.  ? ?Plan: ?Follow up  with behavioral health clinician on : 01/20/22 ?Behavioral recommendations: Utilize deep breathing and meditation. Attend therapy appt on 01/13/22. ?Referral(s): Counselor ?"From scale of 1-10, how likely are you to follow plan?": 10 ? ?[place] Shannahan C Ventura Hollenbeck, LCSW ? ? ?

## 2022-01-20 ENCOUNTER — Ambulatory Visit: Payer: Medicare Other | Attending: Internal Medicine | Admitting: Clinical

## 2022-01-20 DIAGNOSIS — F411 Generalized anxiety disorder: Secondary | ICD-10-CM

## 2022-01-20 DIAGNOSIS — F320 Major depressive disorder, single episode, mild: Secondary | ICD-10-CM | POA: Diagnosis not present

## 2022-01-30 ENCOUNTER — Encounter: Payer: Self-pay | Admitting: Family Medicine

## 2022-01-30 ENCOUNTER — Ambulatory Visit (INDEPENDENT_AMBULATORY_CARE_PROVIDER_SITE_OTHER): Payer: Medicare Other | Admitting: Family Medicine

## 2022-01-30 VITALS — BP 124/73 | HR 75 | Ht 64.0 in | Wt 160.8 lb

## 2022-01-30 DIAGNOSIS — G250 Essential tremor: Secondary | ICD-10-CM

## 2022-01-30 NOTE — Patient Instructions (Addendum)
Below is our plan: ? ?We will continue primidone '50mg'$  daily. Your Dr Wynetta Emery is filling this for you but let me know if you need anything. Keep me posted on what [facility] says after your appointment.  ? ?Please make sure you are staying well hydrated. I recommend 50-60 ounces daily. Well balanced diet and regular exercise encouraged. Consistent sleep schedule with 6-8 hours recommended.  ? ?Please continue follow up with care team as directed.  ? ?Follow up with me in 6 months.  ? ?You may receive a survey regarding today's visit. I encourage you to leave honest feed back as I do use this information to improve patient care. Thank you for seeing me today!  ? ? ?

## 2022-01-30 NOTE — Progress Notes (Signed)
? ? ?Chief Complaint  ?Patient presents with  ? Essential tremor  ?  Rm 6, 6 month FU  "tremors sometimes get worse, PT gave me something to help me eat and write; will see [facility] neurology in June- was soonest I could get appointment"   ? ? ?HISTORY OF PRESENT ILLNESS: ? ?01/30/22 ALL: ?Kimberly [name] returns for follow up for essential tremor. She continues primidone '50mg'$  daily (PCP). Also on atenolol '25mg'$  daily for BP management. She was last seen 07/2021 and referred to [facility] for worsening tremor. Unable to increase primidone doses as she is taking tamoxifen. Referral to OT also placed. She reports seeing OT and was given exercises and tools to use at home to help with tremor. She was given a weighted device to place on her fork to assist with eating. She feels that it helps but she doesn't like to use out in public. She has an appt with [facility] on 03/19/2022. Tremor waxes and wanes. Tremor worsens with anxiety. She has been seen once by a counselor, Kimberly [name], with [facility]. She has an upcoming appt.  ? ?07/25/2021 ALL:  ?Kimberly [name] returns for follow up for essential tremor. She is taking primidone '50mg'$  daily, now prescribed by PCP. She is not certain if it has helped much at all. She continues to have trouble with facial and hand tremors but reports that her whole body can shake. She is very hesitant about medication side effects. She has been reluctant to consider procedural based options but feels she is ready to talk to someone. She admits that anxiety and insomnia continue to pose a significant problem for her. She continues buspirone for anxiety. She feels that she does fairly well during the day and is distracted by television shows. At night, she lies awake for hours. She is unable to tell me how long she sleeps, on average. She feels that she only gets 2-3 hours of sleep at night. She reports her mind races and her "nerves" keep her up. Trazodone was increased to '75mg'$  at bedtime  for insomnia and referral placed to psychiatry. She does not feel trazodone has helped much. She reports that no one has contacted her regarding referral.  ? ?08/30/20 ALL:  ?Kimberly [name] is a 66 y.o. female here today for follow up for essential tremor. She was started on primidone '50mg'$  at bedtime following approval from oncology due to potential effects on tamoxifen levels. Oncology approved taking 1/2 tablet daily. She has continues '25mg'$  daily and feels it may help a little during the day but tremor worsens at night. She feels that inconsistent sleep patterns and anxiety contribute. She has follow up with oncology next month and PCP in February.  ? ? ?HISTORY (copied from Dr Guadelupe Sabin previous note) ? ?Dear Dr. Wynetta Emery, ?  ?I saw your patient, Kimberly [name], upon your kind request in my neurologic clinic today for Reevaluation of her tremor disorder.  The patient is unaccompanied today. As you know, Kimberly [name] is a 66 year old right-handed woman with an underlying medical history of hypertension, rheumatic fever as a child, kidney stones, breast cancer, depression, and anxiety, who reports worsening tremors affecting her hands, particularly her right hand and also her neck and head area.  She has not had any recent falls.  She does have some issues with her balance however.  She tries to hydrate well with water but sometimes forgets to drink water.  She does not drink any caffeine or alcohol on a regular  basis. ?  ?I have evaluated her a little over 2 years ago for hand tremors, at which time her tremors were mild.  She had a nonfocal neurologic exam.  I suggested we proceed with a brain MRI to rule out a structural cause.  She had a brain MRI with and without contrast on 03/15/2018 and I reviewed the results: IMPRESSION: This MRI of the brain with and without contrast shows the following: ?1.   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  It is next to the  head of the right caudate but does not appear to involve the basal ganglia. ?2.   Scattered foci in the subcortical and deep white matter of the hemispheres consistent with chronic microvascular ischemic changes.  None of the foci appear to be acute. ?3.   There is asymmetry of the pituitary gland.  The gland appears to enhance homogenously.  The pituitary stalk is midline.  This could be an incidental finding or due to a microadenoma.  If clinically indicated, consider dedicated pituitary imaging. ?4.   Chronic ethmoid and right frontal sinusitis. ?5.   There are no acute findings. ?  ?We called her with her test results and she was advised to proceed with a dedicated pituitary gland MRI.  She was agreeable to this.  She had a repeat brain MRI with special attention to the pituitary gland on 03/31/2018 with and without contrast and I reviewed the results:  IMPRESSION:  ?  ?MRI brain and pituitary (with and without) demonstrating: ?1.  Mild scattered periventricular subcortical foci of nonspecific gliosis. ?2.  Dedicated pituitary gland imaging appears normal enhancing pituitary gland tissue.  No evidence of underlying pituitary mass. ?3.  Incidental right frontal developmental venous anomaly. ?  ?She reports no other new symptoms, reports no pain no weakness, no facial symptoms otherwise.  She does have some voice tremor.  She reports difficulty with sleep, she takes trazodone for sleep but is often still awake at night and tosses and turns.  She is not sure that the tremor is affecting her sleep. ?  ?Previously:  ?  ?02/23/18: 66 year old right-handed woman with an underlying medical history of anxiety, hypertension, seasonal allergies, insomnia, and overweight state, who reports a bilateral hand tremor for the past 1-2 years. I reviewed your office note from 12/03/2017. She has been on atenolol, 25 mg once daily, she has not noticed much in the way of tremor benefit from it. She admits that she does not drink enough  water, sometimes only 1 or 2 cups per day, does not drink soda daily, and does not like caffeine in the form of tea or coffee. Sometimes she drinks some juice.  ?She is single and lives with her daughter, she has 1 grown daughter. She is a nonsmoker and does not utilize alcohol. She had routine blood work last year in February which I reviewed, TSH was normal at the time. She has a follow-up appointment next week. She is not familiar with her father side of her family history. She has 1 brother and 1 sister, neither one with tremors. Her mother did not have a tremor, she died at 11 from cancer. 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  ? ? ?REVIEW OF SYSTEMS: Out of a complete 14 system review of symptoms, the patient complains only of the following symptoms, tremor, anxiety, insomnia and all other reviewed systems are negative. ? ? ?ALLERGIES: ?Allergies  ?Allergen Reactions  ? Codeine  Other (See Comments)  ?  Cold sweats  ? ? ? ?HOME MEDICATIONS: ?Outpatient Medications Prior to Visit  ?Medication Sig Dispense Refill  ? amLODipine (NORVASC) 10 MG tablet TAKE 1 TABLET(10 MG) BY MOUTH DAILY 90 tablet 1  ? atenolol (TENORMIN) 25 MG tablet TAKE 1 TABLET(25 MG) BY MOUTH DAILY 90 tablet 0  ? Blood Pressure Monitor DEVI Use as directed to check home blood pressure 2-3 times a week 1 Device 0  ? busPIRone (BUSPAR) 15 MG tablet TAKE 1 TABLET(15 MG) BY MOUTH TWICE DAILY 180 tablet 1  ? Multiple Vitamin (MULTIVITAMIN WITH MINERALS) TABS tablet Take 1 tablet by mouth daily.     ? primidone (MYSOLINE) 50 MG tablet TAKE 1 TABLET(50 MG) BY MOUTH AT BEDTIME 30 tablet 6  ? tamoxifen (NOLVADEX) 20 MG tablet TAKE 1 TABLET(20 MG) BY MOUTH DAILY 90 tablet 3  ? traZODone (DESYREL) 50 MG tablet Take 1 1/2 tab PO QHS 45 tablet 6  ? ibuprofen (ADVIL,MOTRIN) 200 MG tablet Take 200 mg by mouth every 6 (six) hours as needed. (Patient not taking: Reported on 08/02/2021)    ? tobramycin (TOBREX) 0.3 % ophthalmic solution Place 1 drop into the left  eye every 4 (four) hours. (Patient not taking: Reported on 11/25/2021) 5 mL 0  ? ?No facility-administered medications prior to visit.  ? ? ? ?PAST MEDICAL HISTORY: ?Past Medical History:  ?Diagnosis D

## 2022-01-31 NOTE — BH Specialist Note (Signed)
Integrated Behavioral Health Follow Up In-Person Visit ? ?MRN: [id number] ?Name: Kimberly [name] ? ?Number of [place] Clinician visits: 3- Third Visit ? ?Session Start time: 1062 ?  ?Session End time: 6948 ? ?Total time in minutes: 30 ? ? ?Types of Service: Individual psychotherapy ? ?Interpretor:No. Interpretor Name and Language: N/A ? ?Subjective: ?Kimberly [name] is a 66 y.o. female accompanied by  self ?Patient was referred by Karle Plumber, MD for depression and anxiety. ?Patient reports the following symptoms/concerns: Reports feeling anxious at times. Reports that she is trying to improve her sleep but has not been able to meditate. Reports that she has gotten established with [facility] for therapy. ?Duration of problem: 1+ year; Severity of problem: moderate ? ?Objective: ?Mood: Anxious and Affect: Appropriate ?Risk of harm to self or others: No plan to harm self or others ? ?Life Context: ?Family and Social: Pt receives support from daughter who she currently lives with. ?School/Work: Pt is unemployed and receives Fish farm manager.  ?Self-Care: Denies substance use. Pt is currently taking buspirone and trazodone.  ?Life Changes: Pt experiences physical health problems and believes her tics and tremors have worsened overtime. Pt is followed by neurology.  ?(No changes to life context) ? ?Patient and/or Family's Strengths/Protective Factors: ?Social and Emotional competence and Concrete supports in place (healthy food, safe environments, etc.) ? ?Goals Addressed: ?Patient will: ? Reduce symptoms of: anxiety and depression  ? Increase knowledge and/or ability of: coping skills  ? Demonstrate ability to: Increase healthy adjustment to current life circumstances ? ?Progress towards Goals: ?Ongoing ? ?Interventions: ?Interventions utilized:  Optician, dispensing and Supportive Counseling ?Standardized Assessments completed: Not Needed ? ?Patient and/or  Family Response: Pt receptive to tx. Pt receptive to assistance with meditation and deep breathing. Pt will continue therapy with [facility].  ? ?Patient Centered Plan: ?Patient is on the following Treatment Plan(s): Depression and anxiety ? ?Assessment: ?Denies SI/HI. Patient currently experiencing anxiety. Pt continues to experience problems sleeping. Pt has established for therapy with [facility].  ? ?Patient may benefit from utilizing meditation. LCSW provided assistance with meditation and deep breathing. LCSW encouraged pt to continue therapy with [facility]. ? ?Plan: ?Follow up with behavioral health clinician on : Continue therapy with [facility] ?Behavioral recommendations: Utilize meditation and deep breathing. ?Referral(s): Counselor ?"From scale of 1-10, how likely are you to follow plan?": 10 ? ?Jaedan Schuman C Galen Malkowski, LCSW ? ? ?

## 2022-02-19 ENCOUNTER — Other Ambulatory Visit: Payer: Self-pay | Admitting: Internal Medicine

## 2022-02-19 DIAGNOSIS — I10 Essential (primary) hypertension: Secondary | ICD-10-CM

## 2022-02-19 DIAGNOSIS — G252 Other specified forms of tremor: Secondary | ICD-10-CM

## 2022-03-04 ENCOUNTER — Encounter: Payer: Self-pay | Admitting: Internal Medicine

## 2022-03-12 ENCOUNTER — Ambulatory Visit (INDEPENDENT_AMBULATORY_CARE_PROVIDER_SITE_OTHER): Payer: Medicare Other

## 2022-03-12 DIAGNOSIS — Z0000 Encounter for general adult medical examination without abnormal findings: Secondary | ICD-10-CM

## 2022-03-12 NOTE — Progress Notes (Cosign Needed)
Subjective:   Kimberly [name] is a 66 y.o. female who presents for Medicare Annual (Subsequent) preventive examination. I discussed the limitations of evaluation and management by telemedicine and the availability of in person appointments. The patient expressed understanding and agreed to proceed.   Visit performed by audio   Patient location: Home  Provider location: Home  Review of Systems    N/A       Objective:    There were no vitals filed for this visit. There is no height or weight on file to calculate BMI.     03/12/2022   11:31 AM 08/08/2021   11:08 AM 01/02/2021    3:30 PM 06/05/2020   11:06 AM 11/02/2018    1:08 PM 10/08/2018    9:17 AM 08/05/2018   11:33 AM  Advanced Directives  Does Patient Have a Medical Advance Directive? Yes No No No No No No  Type of Advance Directive Healthcare Power of Attorney        Would patient like information on creating a medical advance directive?  No - Patient declined No - Patient declined No - Patient declined No - Patient declined No - Patient declined No - Patient declined    Current Medications (verified) Outpatient Encounter Medications as of 03/12/2022  Medication Sig   amLODipine (NORVASC) 10 MG tablet TAKE 1 TABLET(10 MG) BY MOUTH DAILY   atenolol (TENORMIN) 25 MG tablet TAKE 1 TABLET(25 MG) BY MOUTH DAILY   Blood Pressure Monitor DEVI Use as directed to check home blood pressure 2-3 times a week   busPIRone (BUSPAR) 15 MG tablet TAKE 1 TABLET(15 MG) BY MOUTH TWICE DAILY   Multiple Vitamin (MULTIVITAMIN WITH MINERALS) TABS tablet Take 1 tablet by mouth daily.    primidone (MYSOLINE) 50 MG tablet TAKE 1 TABLET(50 MG) BY MOUTH AT BEDTIME   tamoxifen (NOLVADEX) 20 MG tablet TAKE 1 TABLET(20 MG) BY MOUTH DAILY   traZODone (DESYREL) 50 MG tablet Take 1 1/2 tab PO QHS   [DISCONTINUED] ibuprofen (ADVIL,MOTRIN) 200 MG tablet Take 200 mg by mouth every 6 (six) hours as needed. (Patient not taking: Reported on 08/02/2021)    [DISCONTINUED] tobramycin (TOBREX) 0.3 % ophthalmic solution Place 1 drop into the left eye every 4 (four) hours. (Patient not taking: Reported on 11/25/2021)   No facility-administered encounter medications on file as of 03/12/2022.    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   BREAST LUMPECTOMY WITH RADIOACTIVE SEED LOCALIZATION Left 10/14/2018   Procedure: BREAST LUMPECTOMY WITH RADIOACTIVE SEED LOCALIZATION X2;  Surgeon: Erroll Luna, MD;  Location: [facility];  Service: General;  Laterality: Left;   CHOLECYSTECTOMY     COLONOSCOPY WITH PROPOFOL N/A 08/05/2018   Procedure: COLONOSCOPY WITH PROPOFOL;  Surgeon: Daneil Dolin, MD;  [facility];  Service: Endoscopy;  Laterality: N/A;  12:45pm   TONSILLECTOMY     Family History  Problem Relation Age of Onset   Breast cancer Mother 30   Cancer Maternal Grandmother        thinks it was braest, but not sure what type of cancer, dx >50   Heart disease Paternal Grandfather    Lung cancer Maternal Aunt    Colon cancer Neg Hx    Social History   Socioeconomic History   Marital status: Single    Spouse name: Not on file   Number of children: 2   Years of education: Not on file   Highest education level: 12th grade  Occupational History   Not on file  Tobacco Use   Smoking status: Never   Smokeless tobacco: Never  Vaping Use   Vaping Use: Never used  Substance and Sexual  Activity   Alcohol use: No    Alcohol/week: 0.0 standard drinks   Drug use: No   Sexual activity: Not Currently    Birth control/protection: Post-menopausal  Other Topics Concern   Not on file  Social History Narrative   Lives with daughter, 5 grandchildren   Social Determinants of Health   Financial Resource Strain: Not on file  Food Insecurity: No Food Insecurity   Worried About Charity fundraiser in the Last Year: Never true   Arboriculturist in the Last Year: Never true  Transportation Needs: No Transportation Needs   Lack of Transportation (Medical): No   Lack of Transportation (Non-Medical): No  Physical Activity: Inactive   Days of Exercise per Week: 0 days   Minutes of Exercise per Session: 0 min  Stress: Stress Concern Present   Feeling of Stress : To some extent  Social Connections: Moderately Integrated   Frequency of Communication with Friends and Family: Twice a week   Frequency of Social Gatherings with Friends and Family: More than three times a week   Attends Religious Services: More than 4 times per year   Active Member of [employer] or Organizations: Yes   Attends Music therapist: More than 4 times per year   Marital Status: Never married    Tobacco Counseling Counseling given: Not Answered   Clinical Intake:  Pre-visit preparation completed: Yes  Pain : No/denies pain     Diabetes: No  How often do you need to have someone help you when you read instructions, pamphlets, or other written materials from your doctor or pharmacy?: 3 - Sometimes What is the last grade level you completed in school?: 12th grade  Diabetic?No  Interpreter Needed?: No  Information entered by :: Anson [place] CMA   Activities of Daily Living    03/12/2022   11:32 AM  In your present state of health, do you have any difficulty performing the following activities:  Hearing? 0  Vision? 0  Difficulty concentrating or making decisions? 0  Walking or  climbing stairs? 0  Dressing or bathing? 0  Doing errands, shopping? 1  Comment Pt has a friend that transports her around  Conservation officer, nature and eating ? N  Using the Toilet? N  In the past six months, have you accidently leaked urine? N  Do you have problems with loss of bowel control? N  Managing your Medications? N  Managing your Finances? N  Housekeeping or managing your Housekeeping? N    Patient Care Team: Ladell Pier, MD as PCP - General (Internal Medicine) Gala Romney,  Cristopher Estimable, MD as Consulting Physician (Gastroenterology) Erroll Luna, MD as Consulting Physician (General Surgery) Nicholas Lose, MD as Consulting Physician (Hematology and Oncology) Kyung Rudd, MD as Consulting Physician (Radiation Oncology)  Indicate any recent Medical Services you may have received from other than [facility] providers in the past year (date may be approximate).     Assessment:   This is a routine wellness examination for Kimberly [name].  Hearing/Vision screen No results found.  Dietary issues and exercise activities discussed: Current Exercise Habits: Home exercise routine   Goals Addressed   None    Depression Screen    03/12/2022   11:25 AM 12/23/2021    6:15 PM 11/04/2021   11:22 AM 08/02/2021   11:19 AM 03/22/2021    1:44 PM 01/02/2021    3:31 PM 11/22/2020    1:41 PM  PHQ 2/9 Scores  PHQ - 2 Score '1 2 3 1 3 5 5  '$ PHQ- 9 Score  '9 12  8 9 9    '$ Fall Risk    03/12/2022   11:31 AM 01/30/2022    9:50 AM 11/25/2021    1:41 PM 08/02/2021   10:55 AM 03/22/2021   10:55 AM  Fall Risk   Falls in the past year? 0 0 0 0 0  Number falls in past yr: 0  0 0 0  Injury with Fall? 0  0 0 0  Risk for fall due to : No Fall Risks  No Fall Risks No Fall Risks No Fall Risks  Follow up Falls evaluation completed        FALL RISK PREVENTION PERTAINING TO THE HOME:  Any stairs in or around the home? Yes  If so, are there any without handrails? No  Home free of loose throw rugs in walkways, pet beds,  electrical cords, etc? Yes  Adequate lighting in your home to reduce risk of falls? Yes   ASSISTIVE DEVICES UTILIZED TO PREVENT FALLS:  Life alert? No  Use of a cane, walker or w/c? No  Grab bars in the bathroom? No  Shower chair or bench in shower? No  Elevated toilet seat or a handicapped toilet? No   TIMED UP AND GO:  Was the test performed? No .  Length of time to ambulate 10 feet: 0 sec.    Cognitive Function:    01/02/2021    3:55 PM  MMSE - Mini Mental State Exam  Orientation to time 5  Orientation to Place 5  Registration 3  Attention/ Calculation 5  Recall 3  Language- name 2 objects 2  Language- repeat 1  Language- follow 3 step command 3  Language- read & follow direction 1  Write a sentence 1  Copy design 1  Total score 30        03/12/2022   11:34 AM  6CIT Screen  What Year? 0 points  What month? 0 points  What time? 0 points  Count back from 20 0 points  Months in reverse 0 points  Repeat phrase 0 points  Total Score 0 points    Immunizations Immunization History  Administered Date(s) Administered   Influenza,inj,Quad PF,6+ Mos 09/21/2014, 07/07/2016, 06/11/2017, 06/25/2021   Influenza-Unspecified 06/28/2019, 07/03/2020   Moderna SARS-COV2 Booster Vaccination 10/25/2020   Moderna Sars-Covid-2 Vaccination 11/28/2019, 12/27/2019   Pneumococcal Conjugate-13 11/22/2020   Pneumococcal Polysaccharide-23 09/22/2014   Tdap 08/11/2016    TDAP status: Up to date  Flu Vaccine status: Up to date  Pneumococcal vaccine status: Up to date  Covid-19 vaccine status: Information provided on how to obtain vaccines.   Qualifies for Shingles Vaccine? Yes   Zostavax completed No   Shingrix Completed?: No.    Education has been provided regarding the importance of this vaccine. Patient has been advised to call insurance company to determine out of pocket expense if they have not yet received this vaccine. Advised may also receive vaccine at local pharmacy  or Health Dept. Verbalized acceptance and understanding.  Screening Tests Health Maintenance  Topic Date Due   COVID-19 Vaccine (3 - Moderna risk series) 11/22/2020   Pneumonia Vaccine 74+ Years old (3) 11/22/2021   Zoster Vaccines- Shingrix (1 of 2) 06/22/2022 (Originally 10/07/1975)   INFLUENZA VACCINE  05/13/2022   MAMMOGRAM  09/21/2023   TETANUS/TDAP  08/11/2026   COLONOSCOPY (Pts 45-2yr Insurance coverage will need to be confirmed)  08/05/2028   DEXA SCAN  Completed   Hepatitis C Screening  Completed   HPV VACCINES  Aged Out    Health Maintenance  Health Maintenance Due  Topic Date Due   COVID-19 Vaccine (3 - Moderna risk series) 11/22/2020   Pneumonia Vaccine 68 Years old (369 11/22/2021    Colorectal cancer screening: Type of screening: Colonoscopy. Completed 2019. Repeat every 10 years  Mammogram status: Completed 09/2021. Repeat every year   Lung Cancer Screening: (Low Dose CT Chest recommended if Age 68-80years, 30 pack-year currently smoking OR have quit w/in 15years.) does not qualify.   Lung Cancer Screening Referral: No  Additional Screening:  Hepatitis C Screening: does not qualify; Completed No  Vision Screening: Recommended annual ophthalmology exams for early detection of glaucoma and other disorders of the eye. Is the patient up to date with their annual eye exam?  No  Who is the provider or what is the name of the office in which the patient attends annual eye exams? N/A If pt is not established with a provider, would they like to be referred to a provider to establish care? Yes .   Dental Screening: Recommended annual dental exams for proper oral hygiene  Community Resource Referral / Chronic Care Management: CRR required this visit?  No   CCM required this visit?  No      Plan:     I have personally reviewed and noted the following in the patient's chart:   Medical and social history Use of alcohol, tobacco or illicit drugs  Current  medications and supplements including opioid prescriptions.  Functional ability and status Nutritional status Physical activity Advanced directives List of other physicians Hospitalizations, surgeries, and ER visits in previous 12 months Vitals Screenings to include cognitive, depression, and falls Referrals and appointments  In addition, I have reviewed and discussed with patient certain preventive protocols, quality metrics, and best practice recommendations. A written personalized care plan for preventive services as well as general preventive health recommendations were provided to patient.    Ms. HCieslik, Thank you for taking time to come for your Medicare Wellness Visit. I appreciate your ongoing commitment to your health goals. Please review the following plan we discussed and let me know if I can assist you in the future.   These are the goals we discussed:  Goals      Blood Pressure < 140/90     Exercise 3x per week (30 min per time)        This is a list of the screening recommended for you and due dates:  Health Maintenance  Topic Date Due  COVID-19 Vaccine (3 - Moderna risk series) 11/22/2020   Pneumonia Vaccine (3) 11/22/2021   Zoster (Shingles) Vaccine (1 of 2) 06/22/2022*   Flu Shot  05/13/2022   Mammogram  09/21/2023   Tetanus Vaccine  08/11/2026   Colon Cancer Screening  08/05/2028   DEXA scan (bone density measurement)  Completed   Hepatitis C Screening: USPSTF Recommendation to screen - Ages 82-79 yo.  Completed   HPV Vaccine  Aged Out  *Topic was postponed. The date shown is not the original due date.     Renato Gails, [place]   03/12/2022   Nurse Notes: Patient is aware of care gaps and how to obtain the vaccines needed. Ms. Dieterich plans to call [facility] to see about getting established with an eye doctor for her yearly eye exam and to get a new pair of glasses. No other orders needed at this time.  I have reviewed and agreed to the above  documentation.   Theresia Lo, FNP

## 2022-03-27 ENCOUNTER — Ambulatory Visit: Payer: Medicare Other | Attending: Internal Medicine | Admitting: Internal Medicine

## 2022-03-27 ENCOUNTER — Encounter: Payer: Self-pay | Admitting: Internal Medicine

## 2022-03-27 VITALS — BP 122/80 | HR 77 | Temp 98.2°F | Resp 16 | Wt 162.0 lb

## 2022-03-27 DIAGNOSIS — G249 Dystonia, unspecified: Secondary | ICD-10-CM | POA: Insufficient documentation

## 2022-03-27 DIAGNOSIS — G4700 Insomnia, unspecified: Secondary | ICD-10-CM | POA: Insufficient documentation

## 2022-03-27 DIAGNOSIS — Z23 Encounter for immunization: Secondary | ICD-10-CM | POA: Diagnosis not present

## 2022-03-27 DIAGNOSIS — F320 Major depressive disorder, single episode, mild: Secondary | ICD-10-CM | POA: Diagnosis not present

## 2022-03-27 DIAGNOSIS — Z79899 Other long term (current) drug therapy: Secondary | ICD-10-CM | POA: Diagnosis not present

## 2022-03-27 DIAGNOSIS — M2550 Pain in unspecified joint: Secondary | ICD-10-CM | POA: Diagnosis not present

## 2022-03-27 DIAGNOSIS — I10 Essential (primary) hypertension: Secondary | ICD-10-CM | POA: Diagnosis present

## 2022-03-27 DIAGNOSIS — F411 Generalized anxiety disorder: Secondary | ICD-10-CM

## 2022-03-27 DIAGNOSIS — Z923 Personal history of irradiation: Secondary | ICD-10-CM | POA: Diagnosis not present

## 2022-03-27 DIAGNOSIS — F419 Anxiety disorder, unspecified: Secondary | ICD-10-CM | POA: Diagnosis not present

## 2022-03-27 DIAGNOSIS — Z853 Personal history of malignant neoplasm of breast: Secondary | ICD-10-CM | POA: Insufficient documentation

## 2022-03-27 DIAGNOSIS — F32A Depression, unspecified: Secondary | ICD-10-CM | POA: Diagnosis not present

## 2022-03-27 NOTE — Addendum Note (Signed)
Addended by: Carilyn Goodpasture on: 03/27/2022 03:10 PM   Modules accepted: Orders

## 2022-03-27 NOTE — Progress Notes (Signed)
F/u HTN, possible aspiration Has joint pain and paper work

## 2022-03-27 NOTE — Patient Instructions (Signed)
Take Tylenol as needed for joint pains.  Please call [facility] back to schedule your appointment.  Please call the neurologist back at [facility] if you are unable to located the medication that she prescribed.  Once you get the new medication, please taper off the Primidone.

## 2022-03-27 NOTE — Progress Notes (Signed)
Patient ID: Kimberly [name], female    DOB: 09-09-1956  MRN: [id number]  CC: Letter for School/Work, joint discomfort, Aspiration, and Hypertension   Subjective: Kimberly [name] is a 66 y.o. 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  Since last visit with me, patient has seen the neurologist at [facility].  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  Specialist recommended tapering off primidone.  Recommended trihexyphenidyl instead.  Told to talk with me about possibly placing her on Remeron for mood disorder and insomnia. Pt has not received the Trihexyphenidyl as yet. Her pharmacy did not have it in stock.  They will call around and try to find it.  On last visit, she c/o some dysphagia.  (Please refer to that note). [facility] sent her a letter letting her know that they have been trying to reach her.  She has letter with her today and asked me about it.  She forgot about our conversation on last visit  HTN: Reports compliance with taking atenolol and Norvasc. No CP/SOB/LE edema  Reports she still gets joint aches sometimes but not severe.  No swelling in the jts.  No stiffness.  Reported same on last visit. I recommended Tylenol OTC.  Helpful but ran out.  She plans to get more.    Feels she can control the depression but "it is my nerves at nights.  I worry about the least lil thing."  She continues to take BuSpar.  She continues to take trazodone 75 mg at bedtime.  She had purchased some melatonin but did not start taking it as yet.  She wanted to find out whether she could take it with the trazodone.    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   Intention tremor 10/15/2017   Diabetes mellitus screening 06/10/2017   Breast nodule 03/02/2016   Seasonal allergies 01/07/2016   Right shoulder pain 09/28/2015   Chronic insomnia 07/06/2015   Tinea pedis of both feet 07/06/2015   Nocturia 08/25/2014   Chronic anxiety 08/25/2014   Heme positive stool 08/16/2014   HTN (hypertension) 08/16/2014     Current Outpatient Medications on File Prior to Visit  Medication Sig Dispense Refill   amLODipine (NORVASC) 10 MG tablet TAKE 1 TABLET(10 MG) BY MOUTH DAILY 90 tablet 1   atenolol (TENORMIN) 25 MG tablet TAKE 1 TABLET(25 MG) BY MOUTH DAILY 90 tablet 0   Blood Pressure Monitor DEVI Use as directed to check home blood pressure 2-3 times a week 1 Device 0   busPIRone (BUSPAR) 15 MG tablet TAKE 1 TABLET(15 MG) BY MOUTH TWICE DAILY 180 tablet 1   Multiple Vitamin (MULTIVITAMIN WITH MINERALS) TABS tablet Take 1 tablet by mouth daily.      primidone (MYSOLINE) 50 MG tablet TAKE 1 TABLET(50 MG) BY MOUTH AT BEDTIME 30 tablet 6  tamoxifen (NOLVADEX) 20 MG tablet TAKE 1 TABLET(20 MG) BY MOUTH DAILY 90 tablet 3   traZODone (DESYREL) 50 MG tablet Take 1 1/2 tab PO QHS 45 tablet 6   No current facility-administered medications on file prior to visit.    Allergies  Allergen Reactions   Codeine Other (See Comments)    Cold sweats    Social History   Socioeconomic History   Marital status: Single    Spouse name: Not on file   Number of children: 2   Years of education: Not on file   Highest education level: 12th grade  Occupational History   Not on file  Tobacco Use   Smoking status: Never   Smokeless tobacco: Never  Vaping Use    Vaping Use: Never used  Substance and Sexual Activity   Alcohol use: No    Alcohol/week: 0.0 standard drinks of alcohol   Drug use: No   Sexual activity: Not Currently    Birth control/protection: Post-menopausal  Other Topics Concern   Not on file  Social History Narrative   Lives with daughter, 5 grandchildren   Social Determinants of Health   Financial Resource Strain: Low Risk  (01/02/2021)   Overall Financial Resource Strain (CARDIA)    Difficulty of Paying Living Expenses: Not hard at all  Food Insecurity: No Food Insecurity (03/12/2022)   Hunger Vital Sign    Worried About Running Out of Food in the Last Year: Never true    [place] in the Last Year: Never true  Transportation Needs: No Transportation Needs (03/12/2022)   PRAPARE - Hydrologist (Medical): No    Lack of Transportation (Non-Medical): No  Physical Activity: Inactive (03/12/2022)   Exercise Vital Sign    Days of Exercise per Week: 0 days    Minutes of Exercise per Session: 0 min  Stress: Stress Concern Present (03/12/2022)   [place]    Feeling of Stress : To some extent  Social Connections: Moderately Integrated (03/12/2022)   Social Connection and Isolation Panel [NHANES]    Frequency of Communication with Friends and Family: Twice a week    Frequency of Social Gatherings with Friends and Family: More than three times a week    Attends Religious Services: More than 4 times per year    Active Member of [employer] or Organizations: Yes    Attends Music therapist: More than 4 times per year    Marital Status: Never married  Intimate Partner Violence: Not At Risk (03/12/2022)   Humiliation, Afraid, Rape, and Kick questionnaire    Fear of Current or Ex-Partner: No    Emotionally Abused: No    Physically Abused: No    Sexually Abused: No    Family History  Problem Relation Age of Onset   Breast  cancer Mother 22   Cancer Maternal Grandmother        thinks it was braest, but not sure what type of cancer, dx >50   Heart disease Paternal Grandfather    Lung cancer Maternal Aunt    Colon cancer Neg Hx     Past Surgical History:  Procedure Laterality Date   APPENDECTOMY     bowel obstruction     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   BREAST LUMPECTOMY WITH RADIOACTIVE SEED LOCALIZATION Left 10/14/2018   Procedure: BREAST LUMPECTOMY WITH RADIOACTIVE SEED LOCALIZATION X2;  Surgeon: Erroll Luna, MD;  Location:  [facility] OR;  Service: General;  Laterality: Left;   CHOLECYSTECTOMY     COLONOSCOPY WITH PROPOFOL N/A 08/05/2018   Procedure: COLONOSCOPY WITH PROPOFOL;  Surgeon: Daneil Dolin, MD;  Location: [facility];  Service: Endoscopy;  Laterality: N/A;  12:45pm   TONSILLECTOMY      ROS: Review of Systems Negative except as stated above  PHYSICAL EXAM: BP 122/80   Pulse 77   Temp 98.2 F (36.8 C) (Oral)   Resp 16   Wt 162 lb (73.5 kg)   SpO2 97%   BMI 27.81 kg/m   Wt Readings from Last 3 Encounters:  03/27/22 162 lb (73.5 kg)  01/30/22 160 lb 12.8 oz (72.9 kg)  11/25/21 160 lb 6.4 oz (72.8 kg)    Physical Exam  General appearance - alert, well appearing, and in no distress Mental status - normal mood, behavior, speech, dress.  Patient a little forgetful. Neck - supple, no significant adenopathy Chest - clear to auscultation, no wheezes, rales or rhonchi, symmetric air entry Heart - normal rate, regular rhythm, normal S1, S2, no murmurs, rubs, clicks or gallops Extremities -no lower extremity edema MSK: No joint enlargements noted for the joints in the hands.  She has good range of motion in the hand/fingers, elbows and knees. Neuro: Patient has constant movement of the head that look like tics.  Also has vocalization changes at times.     03/27/2022   11:23 AM 03/12/2022   11:25 AM 12/23/2021    6:15 PM  Depression screen PHQ 2/9  Decreased Interest 1 0 0   Down, Depressed, Hopeless '2 1 2  '$ PHQ - 2 Score '3 1 2  '$ Altered sleeping 3  3  Tired, decreased energy 0  1  Change in appetite 0  0  Feeling bad or failure about yourself  1  1  Trouble concentrating 0  2  Moving slowly or fidgety/restless 0  0  Suicidal thoughts 0  0  PHQ-9 Score 7  9       Latest Ref Rng & Units 11/25/2021    2:22 PM 11/22/2020    2:30 PM 04/19/2020    2:32 PM  CMP  Glucose 70 - 99 mg/dL 81  86  97   BUN 8 - 27 mg/dL '13  16  17   '$ Creatinine 0.57 - 1.00 mg/dL 0.78  0.79  1.00   Sodium 134 - 144 mmol/L 140  140  141   Potassium 3.5 - 5.2 mmol/L 5.0  5.0  4.4   Chloride 96 - 106 mmol/L 104  104  105   CO2 20 - 29 mmol/L '22  16  26   '$ Calcium 8.7 - 10.3 mg/dL 9.8  10.0  10.0   Total Protein 6.0 - 8.5 g/dL 7.7  7.5    Total Bilirubin 0.0 - 1.2 mg/dL <0.2  <0.2    Alkaline Phos 44 - 121 IU/L 67  64    AST 0 - 40 IU/L 19  23    ALT 0 - 32 IU/L 12  15     Lipid Panel     Component Value Date/Time   CHOL 185 11/25/2021 1422   TRIG 154 (H) 11/25/2021 1422   HDL 73 11/25/2021 1422   CHOLHDL 2.5 11/25/2021 1422   CHOLHDL 2.7 11/13/2016 1102   VLDL 25 11/13/2016 1102   LDLCALC 86 11/25/2021 1422    CBC    Component Value Date/Time   WBC 8.3 11/25/2021 1422  WBC 6.1 10/08/2018 0919   RBC 4.87 11/25/2021 1422   RBC 4.84 10/08/2018 0919   HGB 14.8 11/25/2021 1422   HCT 44.3 11/25/2021 1422   PLT 426 11/25/2021 1422   MCV 91 11/25/2021 1422   MCH 30.4 11/25/2021 1422   MCH 30.4 10/08/2018 0919   MCHC 33.4 11/25/2021 1422   MCHC 32.7 10/08/2018 0919   RDW 12.3 11/25/2021 1422   LYMPHSABS 1.9 10/08/2018 0919   MONOABS 0.6 10/08/2018 0919   EOSABS 0.2 10/08/2018 0919   BASOSABS 0.1 10/08/2018 0919    ASSESSMENT AND PLAN: 1. Essential hypertension At goal.  Continue Norvasc and atenolol.  2. Generalized anxiety disorder Continue BuSpar.  3. Mild major depression ([place]) Discussed with patient about changing from trazodone to mirtazapine at bedtime  instead.  Advised that the medication can cause some weight gain.  Patient states that she just got a new bottle of trazodone and she would prefer to finish out which she has and try taking it with melatonin for sleep.  If she finds this not to be helpful, then she will consider having me change to Remeron on next visit.  4. Polyarthralgia No signs of active inflammation on exam.  Recommend continue Tylenol over-the-counter as needed.  5. Dystonia Advised patient that if her pharmacy is not able to find the Trihexyphenidyl, then she should let the neurologist at [facility] know so that other options can be entertained.  If she is able to get it, advised to taper off the primidone as was instructed.  6. Need for vaccination against Streptococcus pneumoniae Due for PCV 23 today.  Patient is agreeable to receiving that today.   Patient was given the opportunity to ask questions.  Patient verbalized understanding of the plan and was able to repeat key elements of the plan.   This documentation was completed using Radio producer.  Any transcriptional errors are unintentional.  No orders of the defined types were placed in this encounter.    Requested Prescriptions    No prescriptions requested or ordered in this encounter    Return in about 4 months (around 07/27/2022).  Karle Plumber, MD, FACP

## 2022-04-16 ENCOUNTER — Other Ambulatory Visit: Payer: Self-pay | Admitting: Internal Medicine

## 2022-04-16 DIAGNOSIS — I10 Essential (primary) hypertension: Secondary | ICD-10-CM

## 2022-04-30 ENCOUNTER — Other Ambulatory Visit: Payer: Self-pay | Admitting: Internal Medicine

## 2022-04-30 DIAGNOSIS — F419 Anxiety disorder, unspecified: Secondary | ICD-10-CM

## 2022-05-12 ENCOUNTER — Other Ambulatory Visit: Payer: Self-pay | Admitting: Internal Medicine

## 2022-05-12 DIAGNOSIS — G252 Other specified forms of tremor: Secondary | ICD-10-CM

## 2022-05-12 DIAGNOSIS — I10 Essential (primary) hypertension: Secondary | ICD-10-CM

## 2022-05-13 NOTE — Telephone Encounter (Signed)
Requested Prescriptions  Pending Prescriptions Disp Refills  . primidone (MYSOLINE) 50 MG tablet [Pharmacy Med Name: PRIMIDONE '50MG'$  TABLETS] 90 tablet 0    Sig: TAKE 1 TABLET(50 MG) BY MOUTH AT BEDTIME     Neurology:  Anticonvulsants - primidone Failed - 05/12/2022  8:54 AM      Failed - Primidone (Serum) in normal range and within 360 days    No results found for: "PRIMIDONE"       Passed - HCT in normal range and within 360 days    Hematocrit  Date Value Ref Range Status  11/25/2021 44.3 34.0 - 46.6 % Final         Passed - HGB in normal range and within 360 days    Hemoglobin  Date Value Ref Range Status  11/25/2021 14.8 11.1 - 15.9 g/dL Final         Passed - PLT in normal range and within 360 days    Platelets  Date Value Ref Range Status  11/25/2021 426 150 - 450 x10E3/uL Final         Passed - WBC in normal range and within 360 days    WBC  Date Value Ref Range Status  11/25/2021 8.3 3.4 - 10.8 x10E3/uL Final  10/08/2018 6.1 4.0 - 10.5 K/uL Final         Passed - AST in normal range and within 360 days    AST  Date Value Ref Range Status  11/25/2021 19 0 - 40 IU/L Final  09/15/2018 16 15 - 41 U/L Final         Passed - ALT in normal range and within 360 days    ALT  Date Value Ref Range Status  11/25/2021 12 0 - 32 IU/L Final  09/15/2018 11 0 - 44 U/L Final         Passed - Cr in normal range and within 360 days    Creatinine  Date Value Ref Range Status  09/15/2018 0.92 0.44 - 1.00 mg/dL Final   Creat  Date Value Ref Range Status  11/13/2016 0.98 0.50 - 0.99 mg/dL Final    Comment:      For patients > or = 68 years of age: The upper reference limit for Creatinine is approximately 13% higher for people identified as African-American.      Creatinine, Ser  Date Value Ref Range Status  11/25/2021 0.78 0.57 - 1.00 mg/dL Final         Passed - Completed PHQ-2 or PHQ-9 in the last 360 days      Passed - Valid encounter within last 12 months     Recent Outpatient Visits          1 month ago Essential hypertension   [place], MD   5 months ago Essential hypertension   [place], Deborah B, MD   9 months ago Essential hypertension   [place], Deborah B, MD   1 year ago Essential hypertension   [place], Deborah B, MD   1 year ago Encounter for Medicare annual wellness exam   [place], RPH-CPP      Future Appointments            In 2 months Ladell Pier, MD [place]

## 2022-05-13 NOTE — Telephone Encounter (Signed)
Requested medication (s) are due for refill today - yes  Requested medication (s) are on the active medication list -yes  Future visit scheduled -yes  Last refill: 10/23/21 #30 6RF  Notes to clinic: fails serum level on file- sent for PCP review   Requested Prescriptions  Pending Prescriptions Disp Refills   primidone (MYSOLINE) 50 MG tablet [Pharmacy Med Name: PRIMIDONE '50MG'$  TABLETS] 30 tablet 6    Sig: TAKE 1 TABLET(50 MG) BY MOUTH AT BEDTIME     Neurology:  Anticonvulsants - primidone Failed - 05/12/2022  6:59 AM      Failed - Primidone (Serum) in normal range and within 360 days    No results found for: "PRIMIDONE"       Passed - HCT in normal range and within 360 days    Hematocrit  Date Value Ref Range Status  11/25/2021 44.3 34.0 - 46.6 % Final         Passed - HGB in normal range and within 360 days    Hemoglobin  Date Value Ref Range Status  11/25/2021 14.8 11.1 - 15.9 g/dL Final         Passed - PLT in normal range and within 360 days    Platelets  Date Value Ref Range Status  11/25/2021 426 150 - 450 x10E3/uL Final         Passed - WBC in normal range and within 360 days    WBC  Date Value Ref Range Status  11/25/2021 8.3 3.4 - 10.8 x10E3/uL Final  10/08/2018 6.1 4.0 - 10.5 K/uL Final         Passed - AST in normal range and within 360 days    AST  Date Value Ref Range Status  11/25/2021 19 0 - 40 IU/L Final  09/15/2018 16 15 - 41 U/L Final         Passed - ALT in normal range and within 360 days    ALT  Date Value Ref Range Status  11/25/2021 12 0 - 32 IU/L Final  09/15/2018 11 0 - 44 U/L Final         Passed - Cr in normal range and within 360 days    Creatinine  Date Value Ref Range Status  09/15/2018 0.92 0.44 - 1.00 mg/dL Final   Creat  Date Value Ref Range Status  11/13/2016 0.98 0.50 - 0.99 mg/dL Final    Comment:      For patients > or = 68 years of age: The upper reference limit for Creatinine is approximately 13% higher for  people identified as African-American.      Creatinine, Ser  Date Value Ref Range Status  11/25/2021 0.78 0.57 - 1.00 mg/dL Final         Passed - Completed PHQ-2 or PHQ-9 in the last 360 days      Passed - Valid encounter within last 12 months    Recent Outpatient Visits           1 month ago Essential hypertension   [place] Ladell Pier, MD   5 months ago Essential hypertension   [place] Ladell Pier, MD   9 months ago Essential hypertension   [place] Ladell Pier, MD   1 year ago Essential hypertension   [facility] Ladell Pier, MD   1 year ago Encounter for Medicare annual wellness exam  [place], RPH-CPP       Future Appointments             In 2 months Ladell Pier, MD [place]            Signed Prescriptions Disp Refills   atenolol (TENORMIN) 25 MG tablet 90 tablet 0    Sig: TAKE 1 TABLET(25 MG) BY MOUTH DAILY     Cardiovascular: Beta Blockers 2 Passed - 05/12/2022  6:59 AM      Passed - Cr in normal range and within 360 days    Creatinine  Date Value Ref Range Status  09/15/2018 0.92 0.44 - 1.00 mg/dL Final   Creat  Date Value Ref Range Status  11/13/2016 0.98 0.50 - 0.99 mg/dL Final    Comment:      For patients > or = 68 years of age: The upper reference limit for Creatinine is approximately 13% higher for people identified as African-American.      Creatinine, Ser  Date Value Ref Range Status  11/25/2021 0.78 0.57 - 1.00 mg/dL Final         Passed - Last BP in normal range    BP Readings from Last 1 Encounters:  03/27/22 122/80         Passed - Last Heart Rate in normal range    Pulse Readings from Last 1 Encounters:  03/27/22 77         Passed - Valid encounter within last 6 months     Recent Outpatient Visits           1 month ago Essential hypertension   [place], Neoma Laming B, MD   5 months ago Essential hypertension   [place], Deborah B, MD   9 months ago Essential hypertension   [place], Deborah B, MD   1 year ago Essential hypertension   [place] Ladell Pier, MD   1 year ago Encounter for Medicare annual wellness exam   [place], RPH-CPP       Future Appointments             In 2 months Ladell Pier, MD [place]               Requested Prescriptions  Pending Prescriptions Disp Refills   primidone (MYSOLINE) 50 MG tablet [Pharmacy Med Name: PRIMIDONE '50MG'$  TABLETS] 30 tablet 6    Sig: TAKE 1 TABLET(50 MG) BY MOUTH AT BEDTIME     Neurology:  Anticonvulsants - primidone Failed - 05/12/2022  6:59 AM      Failed - Primidone (Serum) in normal range and within 360 days    No results found for: "PRIMIDONE"       Passed - HCT in normal range and within 360 days    Hematocrit  Date Value Ref Range Status  11/25/2021 44.3 34.0 - 46.6 % Final         Passed - HGB in normal range and within 360 days    Hemoglobin  Date Value Ref Range Status  11/25/2021 14.8 11.1 - 15.9 g/dL Final         Passed - PLT in normal range and within 360 days    Platelets  Date Value  Ref Range Status  11/25/2021 426 150 - 450 x10E3/uL Final         Passed - WBC in normal range and within 360 days    WBC  Date Value Ref Range Status  11/25/2021 8.3 3.4 - 10.8 x10E3/uL Final  10/08/2018 6.1 4.0 - 10.5 K/uL Final         Passed - AST in normal range and within 360 days    AST  Date Value Ref Range Status  11/25/2021 19 0 - 40 IU/L Final  09/15/2018 16 15 - 41 U/L Final         Passed - ALT in normal range  and within 360 days    ALT  Date Value Ref Range Status  11/25/2021 12 0 - 32 IU/L Final  09/15/2018 11 0 - 44 U/L Final         Passed - Cr in normal range and within 360 days    Creatinine  Date Value Ref Range Status  09/15/2018 0.92 0.44 - 1.00 mg/dL Final   Creat  Date Value Ref Range Status  11/13/2016 0.98 0.50 - 0.99 mg/dL Final    Comment:      For patients > or = 68 years of age: The upper reference limit for Creatinine is approximately 13% higher for people identified as African-American.      Creatinine, Ser  Date Value Ref Range Status  11/25/2021 0.78 0.57 - 1.00 mg/dL Final         Passed - Completed PHQ-2 or PHQ-9 in the last 360 days      Passed - Valid encounter within last 12 months    Recent Outpatient Visits           1 month ago Essential hypertension   [place], Deborah B, MD   5 months ago Essential hypertension   [place], Deborah B, MD   9 months ago Essential hypertension   [place], Deborah B, MD   1 year ago Essential hypertension   [place] Ladell Pier, MD   1 year ago Encounter for Medicare annual wellness exam   [place], RPH-CPP       Future Appointments             In 2 months Ladell Pier, MD [place]            Signed Prescriptions Disp Refills   atenolol (TENORMIN) 25 MG tablet 90 tablet 0    Sig: TAKE 1 TABLET(25 MG) BY MOUTH DAILY     Cardiovascular: Beta Blockers 2 Passed - 05/12/2022  6:59 AM      Passed - Cr in normal range and within 360 days    Creatinine  Date Value Ref Range Status  09/15/2018 0.92 0.44 - 1.00 mg/dL Final   Creat  Date Value Ref Range Status  11/13/2016 0.98 0.50 - 0.99 mg/dL Final    Comment:      For patients > or = 68 years of  age: The upper reference limit for Creatinine is approximately 13% higher for people identified as African-American.      Creatinine, Ser  Date Value Ref Range Status  11/25/2021 0.78 0.57 - 1.00 mg/dL Final         Passed - Last BP in normal range  BP Readings from Last 1 Encounters:  03/27/22 122/80         Passed - Last Heart Rate in normal range    Pulse Readings from Last 1 Encounters:  03/27/22 77         Passed - Valid encounter within last 6 months    Recent Outpatient Visits           1 month ago Essential hypertension   [place], MD   5 months ago Essential hypertension   [place], MD   9 months ago Essential hypertension   [place], Deborah B, MD   1 year ago Essential hypertension   [place], Deborah B, MD   1 year ago Encounter for Medicare annual wellness exam   [place], RPH-CPP       Future Appointments             In 2 months Ladell Pier, MD [place]

## 2022-05-13 NOTE — Telephone Encounter (Signed)
Requested Prescriptions  Pending Prescriptions Disp Refills  . primidone (MYSOLINE) 50 MG tablet [Pharmacy Med Name: PRIMIDONE '50MG'$  TABLETS] 30 tablet 6    Sig: TAKE 1 TABLET(50 MG) BY MOUTH AT BEDTIME     Neurology:  Anticonvulsants - primidone Failed - 05/12/2022  6:59 AM      Failed - Primidone (Serum) in normal range and within 360 days    No results found for: "PRIMIDONE"       Passed - HCT in normal range and within 360 days    Hematocrit  Date Value Ref Range Status  11/25/2021 44.3 34.0 - 46.6 % Final         Passed - HGB in normal range and within 360 days    Hemoglobin  Date Value Ref Range Status  11/25/2021 14.8 11.1 - 15.9 g/dL Final         Passed - PLT in normal range and within 360 days    Platelets  Date Value Ref Range Status  11/25/2021 426 150 - 450 x10E3/uL Final         Passed - WBC in normal range and within 360 days    WBC  Date Value Ref Range Status  11/25/2021 8.3 3.4 - 10.8 x10E3/uL Final  10/08/2018 6.1 4.0 - 10.5 K/uL Final         Passed - AST in normal range and within 360 days    AST  Date Value Ref Range Status  11/25/2021 19 0 - 40 IU/L Final  09/15/2018 16 15 - 41 U/L Final         Passed - ALT in normal range and within 360 days    ALT  Date Value Ref Range Status  11/25/2021 12 0 - 32 IU/L Final  09/15/2018 11 0 - 44 U/L Final         Passed - Cr in normal range and within 360 days    Creatinine  Date Value Ref Range Status  09/15/2018 0.92 0.44 - 1.00 mg/dL Final   Creat  Date Value Ref Range Status  11/13/2016 0.98 0.50 - 0.99 mg/dL Final    Comment:      For patients > or = 68 years of age: The upper reference limit for Creatinine is approximately 13% higher for people identified as African-American.      Creatinine, Ser  Date Value Ref Range Status  11/25/2021 0.78 0.57 - 1.00 mg/dL Final         Passed - Completed PHQ-2 or PHQ-9 in the last 360 days      Passed - Valid encounter within last 12 months     Recent Outpatient Visits          1 month ago Essential hypertension   [place], MD   5 months ago Essential hypertension   [place]. [place]. [place], Deborah B, MD   9 months ago Essential hypertension   [place], Deborah B, MD   1 year ago Essential hypertension   [place], Deborah B, MD   1 year ago Encounter for Medicare annual wellness exam   [place], RPH-CPP      Future Appointments            In 2 months Ladell Pier, MD [place]           .  atenolol (TENORMIN) 25 MG tablet [Pharmacy Med Name: ATENOLOL '25MG'$  TABLETS] 90 tablet 0    Sig: TAKE 1 TABLET(25 MG) BY MOUTH DAILY     Cardiovascular: Beta Blockers 2 Passed - 05/12/2022  6:59 AM      Passed - Cr in normal range and within 360 days    Creatinine  Date Value Ref Range Status  09/15/2018 0.92 0.44 - 1.00 mg/dL Final   Creat  Date Value Ref Range Status  11/13/2016 0.98 0.50 - 0.99 mg/dL Final    Comment:      For patients > or = 68 years of age: The upper reference limit for Creatinine is approximately 13% higher for people identified as African-American.      Creatinine, Ser  Date Value Ref Range Status  11/25/2021 0.78 0.57 - 1.00 mg/dL Final         Passed - Last BP in normal range    BP Readings from Last 1 Encounters:  03/27/22 122/80         Passed - Last Heart Rate in normal range    Pulse Readings from Last 1 Encounters:  03/27/22 77         Passed - Valid encounter within last 6 months    Recent Outpatient Visits          1 month ago Essential hypertension   [place], MD   5 months ago Essential hypertension   [place], MD   9 months  ago Essential hypertension   [place], Deborah B, MD   1 year ago Essential hypertension   [place], Deborah B, MD   1 year ago Encounter for Medicare annual wellness exam   [place], RPH-CPP      Future Appointments            In 2 months Wynetta Emery Dalbert Batman, MD [place]

## 2022-06-06 ENCOUNTER — Other Ambulatory Visit: Payer: Self-pay | Admitting: Internal Medicine

## 2022-06-06 DIAGNOSIS — F320 Major depressive disorder, single episode, mild: Secondary | ICD-10-CM

## 2022-06-06 NOTE — Telephone Encounter (Signed)
Requested Prescriptions  Pending Prescriptions Disp Refills  . traZODone (DESYREL) 50 MG tablet [Pharmacy Med Name: TRAZODONE '50MG'$  TABLETS] 45 tablet 6    Sig: TAKE 1 AND 1/2 TABLETS BY MOUTH EVERY NIGHT AT BEDTIME     Psychiatry: Antidepressants - Serotonin Modulator Passed - 06/06/2022  7:08 AM      Passed - Completed PHQ-2 or PHQ-9 in the last 360 days      Passed - Valid encounter within last 6 months    Recent Outpatient Visits          2 months ago Essential hypertension   [place], MD   6 months ago Essential hypertension   [place], MD   10 months ago Essential hypertension   [place], Deborah B, MD   1 year ago Essential hypertension   [place], Deborah B, MD   1 year ago Encounter for Medicare annual wellness exam   [place], [place], RPH-CPP      Future Appointments            In 1 month Wynetta Emery, Dalbert Batman, MD [place]

## 2022-07-18 ENCOUNTER — Other Ambulatory Visit (HOSPITAL_BASED_OUTPATIENT_CLINIC_OR_DEPARTMENT_OTHER): Payer: Self-pay

## 2022-07-18 MED ORDER — FLUAD QUADRIVALENT 0.5 ML IM PRSY
PREFILLED_SYRINGE | INTRAMUSCULAR | 0 refills | Status: DC
  Filled 2022-07-18: qty 0.5, 1d supply, fill #0

## 2022-07-29 ENCOUNTER — Ambulatory Visit: Payer: Medicare Other | Attending: Internal Medicine | Admitting: Internal Medicine

## 2022-07-29 VITALS — BP 127/78 | HR 76 | Temp 97.8°F | Ht 64.0 in | Wt 160.2 lb

## 2022-07-29 DIAGNOSIS — I10 Essential (primary) hypertension: Secondary | ICD-10-CM | POA: Diagnosis present

## 2022-07-29 DIAGNOSIS — F419 Anxiety disorder, unspecified: Secondary | ICD-10-CM | POA: Diagnosis not present

## 2022-07-29 DIAGNOSIS — L602 Onychogryphosis: Secondary | ICD-10-CM | POA: Diagnosis not present

## 2022-07-29 DIAGNOSIS — R7303 Prediabetes: Secondary | ICD-10-CM | POA: Insufficient documentation

## 2022-07-29 DIAGNOSIS — Q845 Enlarged and hypertrophic nails: Secondary | ICD-10-CM | POA: Diagnosis not present

## 2022-07-29 DIAGNOSIS — Z2911 Encounter for prophylactic immunotherapy for respiratory syncytial virus (RSV): Secondary | ICD-10-CM

## 2022-07-29 DIAGNOSIS — F411 Generalized anxiety disorder: Secondary | ICD-10-CM | POA: Diagnosis not present

## 2022-07-29 DIAGNOSIS — G4700 Insomnia, unspecified: Secondary | ICD-10-CM | POA: Diagnosis not present

## 2022-07-29 DIAGNOSIS — Z79899 Other long term (current) drug therapy: Secondary | ICD-10-CM | POA: Insufficient documentation

## 2022-07-29 DIAGNOSIS — G249 Dystonia, unspecified: Secondary | ICD-10-CM | POA: Insufficient documentation

## 2022-07-29 DIAGNOSIS — G250 Essential tremor: Secondary | ICD-10-CM | POA: Diagnosis not present

## 2022-07-29 DIAGNOSIS — Z23 Encounter for immunization: Secondary | ICD-10-CM

## 2022-07-29 MED ORDER — RSVPREF3 VAC RECOMB ADJUVANTED 120 MCG/0.5ML IM SUSR: 0.5000 mL | Freq: Once | INTRAMUSCULAR | 0 refills | Status: AC

## 2022-07-29 MED ORDER — BUSPIRONE HCL 15 MG PO TABS
ORAL_TABLET | ORAL | 1 refills | Status: DC
Start: 2022-07-29 — End: 2022-09-11

## 2022-07-29 MED ORDER — ATENOLOL 25 MG PO TABS: ORAL_TABLET | ORAL | 2 refills | Status: DC

## 2022-07-29 NOTE — Progress Notes (Signed)
Patient ID: Kimberly [name], female    DOB: 1955-11-08  MRN: [id number]  CC: Hypertension (HTN f/u.  Waiting to hear from gastroenterologist. Neoma Laming received flu vax this season.)   Subjective: Kimberly [name] is a 66 y.o. 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  Wants podiatry referral.  Nail on RT 5th toe is curled over  Had hard time getting through to GI to schedule appt but no longer having problems swallowing.  Has f/u with neurologist at [facility] later today. Now off Primidone.  On Artane 2 mg BID (written as 3 times a day but patient states she has been taking it only twice a day).  Feels it helps with the tremors.  HTN: taking Norvasc and atenolol as prescribed.  She limits salt in her foods.  No chest pains or shortness of breath.  Anxiety: Stable on BuSpar.  Requests refill.  HM:  had flu vaccine.  Declines Shingles vaccine.  Due for COVID vaccine and RSV vaccine.  Plans to get COVID vaccine today at outside pharmacy.   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   Intention tremor 10/15/2017   Diabetes mellitus screening 06/10/2017   Breast nodule 03/02/2016   Seasonal allergies 01/07/2016   Right shoulder pain 09/28/2015   Chronic insomnia 07/06/2015   Tinea pedis of both feet 07/06/2015   Nocturia 08/25/2014   Chronic anxiety 08/25/2014   Heme positive stool 08/16/2014   HTN  (hypertension) 08/16/2014     Current Outpatient Medications on File Prior to Visit  Medication Sig Dispense Refill   amLODipine (NORVASC) 10 MG tablet TAKE 1 TABLET(10 MG) BY MOUTH DAILY 90 tablet 1   atenolol (TENORMIN) 25 MG tablet TAKE 1 TABLET(25 MG) BY MOUTH DAILY 90 tablet 0   Blood Pressure Monitor DEVI Use as directed to check home blood pressure 2-3 times a week 1 Device 0   busPIRone (BUSPAR) 15 MG tablet TAKE 1 TABLET(15 MG) BY MOUTH TWICE DAILY 180 tablet 1   influenza vaccine adjuvanted (FLUAD QUADRIVALENT) 0.5 ML injection Inject into the muscle. 0.5 mL 0   Multiple Vitamin (MULTIVITAMIN WITH MINERALS) TABS tablet Take 1 tablet by mouth daily.      primidone (MYSOLINE) 50 MG tablet TAKE 1 TABLET(50 MG) BY MOUTH AT BEDTIME (Patient not taking: Reported on 07/29/2022) 90 tablet 0   tamoxifen (NOLVADEX) 20 MG tablet TAKE 1 TABLET(20 MG) BY MOUTH DAILY 90 tablet 3   traZODone (DESYREL) 50 MG tablet TAKE 1 AND 1/2 TABLETS BY MOUTH EVERY NIGHT AT BEDTIME 135 tablet 0   No current facility-administered medications on file prior to visit.    Allergies  Allergen Reactions   Codeine Other (See Comments)    Cold sweats    Social History   Socioeconomic History   Marital status: Single    Spouse name: Not on file   Number of children: 2  Years of education: Not on file   Highest education level: 12th grade  Occupational History   Not on file  Tobacco Use   Smoking status: Never   Smokeless tobacco: Never  Vaping Use   Vaping Use: Never used  Substance and Sexual Activity   Alcohol use: No    Alcohol/week: 0.0 standard drinks of alcohol   Drug use: No   Sexual activity: Not Currently    Birth control/protection: Post-menopausal  Other Topics Concern   Not on file  Social History Narrative   Lives with daughter, 5 grandchildren   Social Determinants of Health   Financial Resource Strain: Low Risk  (01/02/2021)   Overall Financial Resource Strain (CARDIA)     Difficulty of Paying Living Expenses: Not hard at all  Food Insecurity: No Food Insecurity (03/12/2022)   Hunger Vital Sign    Worried About Running Out of Food in the Last Year: Never true    Ran Out of Food in the Last Year: Never true  Transportation Needs: No Transportation Needs (03/12/2022)   PRAPARE - Hydrologist (Medical): No    Lack of Transportation (Non-Medical): No  Physical Activity: Inactive (03/12/2022)   Exercise Vital Sign    Days of Exercise per Week: 0 days    Minutes of Exercise per Session: 0 min  Stress: Stress Concern Present (03/12/2022)   [place]    Feeling of Stress : To some extent  Social Connections: Moderately Integrated (03/12/2022)   Social Connection and Isolation Panel [NHANES]    Frequency of Communication with Friends and Family: Twice a week    Frequency of Social Gatherings with Friends and Family: More than three times a week    Attends Religious Services: More than 4 times per year    Active Member of Clubs or Organizations: Yes    Attends Archivist Meetings: More than 4 times per year    Marital Status: Never married  Intimate Partner Violence: Not At Risk (03/12/2022)   Humiliation, Afraid, Rape, and Kick questionnaire    Fear of Current or Ex-Partner: No    Emotionally Abused: No    Physically Abused: No    Sexually Abused: No    Family History  Problem Relation Age of Onset   Breast cancer Mother 41   Cancer Maternal Grandmother        thinks it was braest, but not sure what type of cancer, dx >50   Heart disease Paternal Grandfather    Lung cancer Maternal Aunt    Colon cancer Neg Hx     Past Surgical History:  Procedure Laterality Date   APPENDECTOMY     bowel obstruction     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   BREAST LUMPECTOMY WITH RADIOACTIVE SEED LOCALIZATION Left 10/14/2018   Procedure: BREAST LUMPECTOMY WITH  RADIOACTIVE SEED LOCALIZATION X2;  Surgeon: Erroll Luna, MD;  Location: [facility];  Service: General;  Laterality: Left;   CHOLECYSTECTOMY     COLONOSCOPY WITH PROPOFOL N/A 08/05/2018   Procedure: COLONOSCOPY WITH PROPOFOL;  Surgeon: Daneil Dolin, MD;  [facility];  Service: Endoscopy;  Laterality: N/A;  12:45pm   TONSILLECTOMY      ROS: Review of Systems Negative except as stated above  PHYSICAL EXAM: BP 127/78 (BP Location: Left Arm, Patient Position: Sitting, Cuff Size: Normal)   Pulse 76   Temp 97.8 F (36.6 C) (Oral)  Ht '5\' 4"'$  (1.626 m)   Wt 160 lb 3.2 oz (72.7 kg)   SpO2 99%   BMI 27.50 kg/m   Physical Exam  General appearance - alert, well appearing, pleasant elderly female and in no distress Mental status -patient is alert and answers questions appropriately. Neck - supple, no significant adenopathy Chest - clear to auscultation, no wheezes, rales or rhonchi, symmetric air entry Heart - normal rate, regular rhythm, normal S1, S2, no murmurs, rubs, clicks or gallops Extremities - peripheral pulses normal, no pedal edema, no clubbing or cyanosis Nails: Toenails are thick and a little overgrown.  However the nail on the right fifth toe is significantly overgrown and curled over to the dorsal surface of the toe. Neuro: Patient with generalized shaking more so of the head.     Latest Ref Rng & Units 11/25/2021    2:22 PM 11/22/2020    2:30 PM 04/19/2020    2:32 PM  CMP  Glucose 70 - 99 mg/dL 81  86  97   BUN 8 - 27 mg/dL '13  16  17   '$ Creatinine 0.57 - 1.00 mg/dL 0.78  0.79  1.00   Sodium 134 - 144 mmol/L 140  140  141   Potassium 3.5 - 5.2 mmol/L 5.0  5.0  4.4   Chloride 96 - 106 mmol/L 104  104  105   CO2 20 - 29 mmol/L '22  16  26   '$ Calcium 8.7 - 10.3 mg/dL 9.8  10.0  10.0   Total Protein 6.0 - 8.5 g/dL 7.7  7.5    Total Bilirubin 0.0 - 1.2 mg/dL <0.2  <0.2    Alkaline Phos 44 - 121 IU/L 67  64    AST 0 - 40 IU/L 19  23    ALT 0 - 32 IU/L 12  15      Lipid Panel     Component Value Date/Time   CHOL 185 11/25/2021 1422   TRIG 154 (H) 11/25/2021 1422   HDL 73 11/25/2021 1422   CHOLHDL 2.5 11/25/2021 1422   CHOLHDL 2.7 11/13/2016 1102   VLDL 25 11/13/2016 1102   LDLCALC 86 11/25/2021 1422    CBC    Component Value Date/Time   WBC 8.3 11/25/2021 1422   WBC 6.1 10/08/2018 0919   RBC 4.87 11/25/2021 1422   RBC 4.84 10/08/2018 0919   HGB 14.8 11/25/2021 1422   HCT 44.3 11/25/2021 1422   PLT 426 11/25/2021 1422   MCV 91 11/25/2021 1422   MCH 30.4 11/25/2021 1422   MCH 30.4 10/08/2018 0919   MCHC 33.4 11/25/2021 1422   MCHC 32.7 10/08/2018 0919   RDW 12.3 11/25/2021 1422   LYMPHSABS 1.9 10/08/2018 0919   MONOABS 0.6 10/08/2018 0919   EOSABS 0.2 10/08/2018 0919   BASOSABS 0.1 10/08/2018 0919    ASSESSMENT AND PLAN:  1. Essential hypertension At goal.  Continue atenolol and Norvasc at current dose.  2. Generalized anxiety disorder Stable on BuSpar.  Refill sent.  3. Overgrown toenails - Ambulatory referral to Podiatry  4. Dystonia Currently on Artane.  She has follow-up appointment today with neurology at [facility].  5. Need for RSV vaccination Prescription given for her to take to her pharmacy to get the RSV vaccine. - RSV vaccine recomb adjuvanted (AREXVY) 120 MCG/0.5ML injection; Inject 0.5 mLs into the muscle once for 1 dose.  Dispense: 0.5 mL; Refill: 0  6. Need for second booster dose of COVID-19 vaccine Patient  plans to get COVID booster today at an outside pharmacy.    Patient was given the opportunity to ask questions.  Patient verbalized understanding of the plan and was able to repeat key elements of the plan.   This documentation was completed using Radio producer.  Any transcriptional errors are unintentional.  No orders of the defined types were placed in this encounter.    Requested Prescriptions    No prescriptions requested or ordered in this encounter     No follow-ups on file.  Karle Plumber, MD, FACP

## 2022-08-07 ENCOUNTER — Ambulatory Visit: Payer: Medicare Other | Admitting: Family Medicine

## 2022-08-11 ENCOUNTER — Encounter: Payer: Self-pay | Admitting: Podiatry

## 2022-08-11 ENCOUNTER — Ambulatory Visit (INDEPENDENT_AMBULATORY_CARE_PROVIDER_SITE_OTHER): Payer: Medicare Other | Admitting: Podiatry

## 2022-08-11 DIAGNOSIS — M79674 Pain in right toe(s): Secondary | ICD-10-CM

## 2022-08-11 DIAGNOSIS — B351 Tinea unguium: Secondary | ICD-10-CM | POA: Diagnosis not present

## 2022-08-11 DIAGNOSIS — M79675 Pain in left toe(s): Secondary | ICD-10-CM | POA: Diagnosis not present

## 2022-08-11 NOTE — Progress Notes (Signed)
   Chief Complaint  Patient presents with   Nail Problem    Nail trim     SUBJECTIVE Patient presents to office today complaining of elongated, thickened nails that cause pain while ambulating in shoes.  Patient is unable to trim their own nails. Patient is here for further evaluation and treatment.  Past Medical History:  Diagnosis Date   Anxiety 1995   Breast cancer ([place]) 2020   Left Breast Cancer   Cancer ([place])    breast left   Depression 1995   Family history of breast cancer    Family history of lung cancer    History of kidney stones    History of rheumatic fever as a child    Hypertension Dx Dec 2015   Panic attack 1995   Personal history of radiation therapy 2020   Left Breast Cancer   Tremor     Allergies  Allergen Reactions   Codeine Other (See Comments)    Cold sweats     OBJECTIVE General Patient is awake, alert, and oriented x 3 and in no acute distress. Derm Skin is dry and supple bilateral. Negative open lesions or macerations. Remaining integument unremarkable. Nails are tender, long, thickened and dystrophic with subungual debris, consistent with onychomycosis, 1-5 bilateral. No signs of infection noted. Vasc  DP and PT pedal pulses palpable bilaterally. Temperature gradient within normal limits.  Neuro Epicritic and protective threshold sensation grossly intact bilaterally.  Musculoskeletal Exam No symptomatic pedal deformities noted bilateral. Muscular strength within normal limits.  ASSESSMENT 1.  Pain due to onychomycosis of toenails both  PLAN OF CARE 1. Patient evaluated today.  2. Instructed to maintain good pedal hygiene and foot care.  3. Mechanical debridement of nails 1-5 bilaterally performed using a nail nipper. Filed with dremel without incident.  4. Return to clinic in 3 mos.    Edrick Kins, DPM [facility]  Dr. Edrick Kins, DPM    [address], [place] [address]                 Office [phone]  Fax [phone]

## 2022-08-20 ENCOUNTER — Other Ambulatory Visit: Payer: Self-pay | Admitting: Internal Medicine

## 2022-08-20 DIAGNOSIS — Z1231 Encounter for screening mammogram for malignant neoplasm of breast: Secondary | ICD-10-CM

## 2022-09-09 NOTE — Progress Notes (Signed)
Patient Care Team: Ladell Pier, MD as PCP - General (Internal Medicine) Gala Romney Cristopher Estimable, MD as Consulting Physician (Gastroenterology) Erroll Luna, MD as Consulting Physician (General Surgery) Nicholas Lose, MD as Consulting Physician (Hematology and Oncology) Kyung Rudd, MD as Consulting Physician (Radiation Oncology)  DIAGNOSIS: No diagnosis found.  SUMMARY OF ONCOLOGIC HISTORY: Oncology History  Ductal carcinoma in situ (DCIS) of left breast  09/07/2018 Initial Diagnosis   Bilateral masses and calcifications, right breast benign, left breast UOQ 3.2 cm biopsy revealed intermediate grade to high-grade DCIS ER 100%, PR 100%, 4 o'clock position retroareolar calcifications 2.2 cm biopsy revealed intermediate grade DCIS with [facility] ER 100%, PR 90%, both lesions are 5.5 cm apart, Tis NX stage 0   10/14/2018 Surgery   Left breast lumpectomy x2: 2 foci of DCIS intermediate grade 1.6 cm and 1.1 cm, LCIS, final margins negative.  The smaller DCIS 0.1 cm from inferior margin, ER 100%, PR 90%, Tis NX stage 0   10/27/2018 Cancer Staging   Staging form: Breast, AJCC 8th Edition - Pathologic: Stage 0 (pTis (DCIS), pN0, cM0, ER+, PR+) - Signed by Gardenia Phlegm, NP on 10/27/2018   11/23/2018 - 12/17/2018 Radiation Therapy   Adjuvant radiation   01/2019 -  Anti-estrogen oral therapy   Tamoxifen daily     CHIEF COMPLIANT: Follow-up of left breast DCIS on tamoxifen   INTERVAL HISTORY: Kimberly [name] is a 66 y.o. with above-mentioned history of  left breast DCIS treated with a lumpectomy x2, radiation, and is currently on tamoxifen. Mammogram on 09/14/2020 showed no evidence of malignancy bilaterally. She presents to the clinic today for follow-up.      ALLERGIES:  is allergic to codeine.  MEDICATIONS:  Current Outpatient Medications  Medication Sig Dispense Refill   amLODipine (NORVASC) 10 MG tablet TAKE 1 TABLET(10 MG) BY MOUTH DAILY 90 tablet 1   amLODipine (NORVASC) 10 MG  tablet Take 1 tablet by mouth daily.     atenolol (TENORMIN) 25 MG tablet TAKE 1 TABLET(25 MG) BY MOUTH DAILY 90 tablet 2   Blood Pressure Monitor DEVI Use as directed to check home blood pressure 2-3 times a week 1 Device 0   busPIRone (BUSPAR) 15 MG tablet TAKE 1 TABLET(15 MG) BY MOUTH TWICE DAILY 180 tablet 1   busPIRone (BUSPAR) 15 MG tablet Take by mouth.     influenza vaccine adjuvanted (FLUAD QUADRIVALENT) 0.5 ML injection Inject into the muscle. 0.5 mL 0   Multiple Vitamin (MULTIVITAMIN WITH MINERALS) TABS tablet Take 1 tablet by mouth daily.      tamoxifen (NOLVADEX) 20 MG tablet TAKE 1 TABLET(20 MG) BY MOUTH DAILY 90 tablet 3   tamoxifen (NOLVADEX) 20 MG tablet 1 tablet (20 mg total).     traZODone (DESYREL) 50 MG tablet TAKE 1 AND 1/2 TABLETS BY MOUTH EVERY NIGHT AT BEDTIME 135 tablet 0   trihexyphenidyl (ARTANE) 2 MG tablet Take by mouth.     No current facility-administered medications for this visit.    PHYSICAL EXAMINATION: ECOG PERFORMANCE STATUS: {CHL ONC ECOG PS:[id number]}  There were no vitals filed for this visit. There were no vitals filed for this visit.  BREAST:*** No palpable masses or nodules in either right or left breasts. No palpable axillary supraclavicular or infraclavicular adenopathy no breast tenderness or nipple discharge. (exam performed in the presence of a chaperone)  LABORATORY DATA:  I have reviewed the data as listed    Latest Ref Rng & Units 11/25/2021    2:22  PM 11/22/2020    2:30 PM 04/19/2020    2:32 PM  CMP  Glucose 70 - 99 mg/dL 81  86  97   BUN 8 - 27 mg/dL '13  16  17   '$ Creatinine 0.57 - 1.00 mg/dL 0.78  0.79  1.00   Sodium 134 - 144 mmol/L 140  140  141   Potassium 3.5 - 5.2 mmol/L 5.0  5.0  4.4   Chloride 96 - 106 mmol/L 104  104  105   CO2 20 - 29 mmol/L '22  16  26   '$ Calcium 8.7 - 10.3 mg/dL 9.8  10.0  10.0   Total Protein 6.0 - 8.5 g/dL 7.7  7.5    Total Bilirubin 0.0 - 1.2 mg/dL <0.2  <0.2    Alkaline Phos 44 - 121 IU/L 67  64     AST 0 - 40 IU/L 19  23    ALT 0 - 32 IU/L 12  15      Lab Results  Component Value Date   WBC 8.3 11/25/2021   HGB 14.8 11/25/2021   HCT 44.3 11/25/2021   MCV 91 11/25/2021   PLT 426 11/25/2021   NEUTROABS 3.3 10/08/2018    ASSESSMENT & PLAN:  No problem-specific Assessment & Plan notes found for this encounter.    No orders of the defined types were placed in this encounter.  The patient has a good understanding of the overall plan. she agrees with it. she will call with any problems that may develop before the next visit here. Total time spent: 30 mins including face to face time and time spent for planning, charting and co-ordination of care   Suzzette Righter, [place] 09/09/22    I Gardiner Coins am scribing for Dr. Lindi Adie  ***

## 2022-09-11 ENCOUNTER — Inpatient Hospital Stay: Payer: Medicare Other | Attending: Hematology and Oncology | Admitting: Hematology and Oncology

## 2022-09-11 VITALS — BP 126/76 | HR 85 | Temp 97.7°F | Resp 18 | Ht 64.0 in | Wt 158.4 lb

## 2022-09-11 DIAGNOSIS — Z923 Personal history of irradiation: Secondary | ICD-10-CM | POA: Diagnosis not present

## 2022-09-11 DIAGNOSIS — Z79810 Long term (current) use of selective estrogen receptor modulators (SERMs): Secondary | ICD-10-CM | POA: Insufficient documentation

## 2022-09-11 DIAGNOSIS — D0512 Intraductal carcinoma in situ of left breast: Secondary | ICD-10-CM | POA: Diagnosis present

## 2022-09-11 MED ORDER — TAMOXIFEN CITRATE 20 MG PO TABS: ORAL_TABLET | ORAL | 3 refills | Status: DC

## 2022-09-11 NOTE — Assessment & Plan Note (Signed)
10/14/2018: Left breast lumpectomy x2: 2 foci of DCIS intermediate grade 1.6 cm and 1.1 cm, LCIS, final margins negative.  The smaller DCIS 0.1 cm from inferior margin, ER 100%, PR 90%, Tis NX stage 0   Treatment plan: 1.  Adjuvant radiation therapy 11/23/2018-12/14/2018 2.  Followed by adjuvant antiestrogen therapy with tamoxifen x5 years   Tamoxifen toxicities:  Denies any hot flashes or myalgias.   Breast cancer surveillance: 1.  Breast exam 09/11/22: Benign 2.  Mammogram 09/23/2021: Benign breast density category B   I renewed her prescription for tamoxifen. Return to clinic in 1 year for follow-up

## 2022-09-12 ENCOUNTER — Other Ambulatory Visit: Payer: Self-pay | Admitting: Internal Medicine

## 2022-09-12 DIAGNOSIS — F320 Major depressive disorder, single episode, mild: Secondary | ICD-10-CM

## 2022-09-12 NOTE — Telephone Encounter (Signed)
Requested Prescriptions  Pending Prescriptions Disp Refills   traZODone (DESYREL) 50 MG tablet [Pharmacy Med Name: TRAZODONE '50MG'$  TABLETS] 135 tablet 0    Sig: TAKE 1 AND 1/2 TABLETS BY MOUTH EVERY NIGHT AT BEDTIME     Psychiatry: Antidepressants - Serotonin Modulator Passed - 09/12/2022  1:18 PM      Passed - Completed PHQ-2 or PHQ-9 in the last 360 days      Passed - Valid encounter within last 6 months    Recent Outpatient Visits           1 month ago Essential hypertension   [place], MD   5 months ago Essential hypertension   [place], Deborah B, MD   9 months ago Essential hypertension   [place], Deborah B, MD   1 year ago Essential hypertension   [place], Deborah B, MD   1 year ago Essential hypertension   [place], MD       Future Appointments             In 2 months Wynetta Emery Dalbert Batman, MD [place]

## 2022-10-11 ENCOUNTER — Other Ambulatory Visit: Payer: Self-pay | Admitting: Internal Medicine

## 2022-10-12 NOTE — Telephone Encounter (Signed)
Requested medication (s) are due for refill today: yes  Requested medication (s) are on the active medication list: yes  Last refill:  08/21/21  Future visit scheduled: yes  Notes to clinic:  historical med.      Requested Prescriptions  Pending Prescriptions Disp Refills   amLODipine (NORVASC) 10 MG tablet [Pharmacy Med Name: AMLODIPINE BESYLATE '10MG'$  TABLETS] 90 tablet     Sig: TAKE 1 TABLET(10 MG) BY MOUTH DAILY     Cardiovascular: Calcium Channel Blockers 2 Passed - 10/11/2022  9:45 AM      Passed - Last BP in normal range    BP Readings from Last 1 Encounters:  09/11/22 126/76         Passed - Last Heart Rate in normal range    Pulse Readings from Last 1 Encounters:  09/11/22 85         Passed - Valid encounter within last 6 months    Recent Outpatient Visits           2 months ago Essential hypertension   [place], MD   6 months ago Essential hypertension   [place], MD   10 months ago Essential hypertension   [place], Deborah B, MD   1 year ago Essential hypertension   [place], Deborah B, MD   1 year ago Essential hypertension   [place], MD       Future Appointments             In 1 month Wynetta Emery, Dalbert Batman, MD [place]

## 2022-10-21 ENCOUNTER — Ambulatory Visit
Admission: RE | Admit: 2022-10-21 | Discharge: 2022-10-21 | Disposition: A | Discharge status: Home / Self Care | Payer: Medicare Other | Source: Ambulatory Visit | Attending: Internal Medicine | Admitting: Internal Medicine

## 2022-10-21 DIAGNOSIS — Z1231 Encounter for screening mammogram for malignant neoplasm of breast: Secondary | ICD-10-CM

## 2022-11-19 ENCOUNTER — Ambulatory Visit: Payer: Medicare Other | Admitting: Podiatry

## 2022-11-20 ENCOUNTER — Ambulatory Visit (INDEPENDENT_AMBULATORY_CARE_PROVIDER_SITE_OTHER): Payer: Medicare Other | Admitting: Podiatry

## 2022-11-20 ENCOUNTER — Encounter: Payer: Self-pay | Admitting: Podiatry

## 2022-11-20 VITALS — BP 142/78 | HR 79

## 2022-11-20 DIAGNOSIS — B351 Tinea unguium: Secondary | ICD-10-CM | POA: Diagnosis not present

## 2022-11-20 DIAGNOSIS — M79674 Pain in right toe(s): Secondary | ICD-10-CM

## 2022-11-20 DIAGNOSIS — M79675 Pain in left toe(s): Secondary | ICD-10-CM | POA: Diagnosis not present

## 2022-11-20 DIAGNOSIS — R7303 Prediabetes: Secondary | ICD-10-CM | POA: Diagnosis not present

## 2022-11-20 NOTE — Progress Notes (Signed)
This patient returns to my office for at risk foot care.  This patient requires this care by a professional since this patient will be at risk due to having prediabetes.  This patient is unable to cut nails herself since the patient cannot reach her nails.These nails are painful walking and wearing shoes.  This patient presents for at risk foot care today.  General Appearance  Alert, conversant and in no acute stress.  Vascular  Dorsalis pedis and posterior tibial  pulses are palpable  bilaterally.  Capillary return is within normal limits  bilaterally. Temperature is within normal limits  bilaterally.  Neurologic  Senn-Weinstein monofilament wire test within normal limits  bilaterally. Muscle power within normal limits bilaterally.  Nails Thick disfigured discolored nails with subungual debris  from hallux to fifth toes bilaterally. No evidence of bacterial infection or drainage bilaterally.  Orthopedic  No limitations of motion  feet .  No crepitus or effusions noted.  No bony pathology or digital deformities noted.  Skin  normotropic skin with no porokeratosis noted bilaterally.  No signs of infections or ulcers noted.     Onychomycosis  Pain in right toes  Pain in left toes  Consent was obtained for treatment procedures.   Mechanical debridement of nails 1-5  bilaterally performed with a nail nipper.  Filed with dremel without incident.    Return office visit     3 months                 Told patient to return for periodic foot care and evaluation due to potential at risk complications.   Gardiner Barefoot DPM

## 2022-12-01 ENCOUNTER — Ambulatory Visit: Payer: Medicare Other | Attending: Internal Medicine | Admitting: Internal Medicine

## 2022-12-01 ENCOUNTER — Encounter: Payer: Self-pay | Admitting: Internal Medicine

## 2022-12-01 VITALS — BP 122/81 | HR 77 | Ht 63.5 in | Wt 158.8 lb

## 2022-12-01 DIAGNOSIS — F411 Generalized anxiety disorder: Secondary | ICD-10-CM

## 2022-12-01 DIAGNOSIS — G249 Dystonia, unspecified: Secondary | ICD-10-CM

## 2022-12-01 DIAGNOSIS — G250 Essential tremor: Secondary | ICD-10-CM | POA: Diagnosis not present

## 2022-12-01 DIAGNOSIS — F419 Anxiety disorder, unspecified: Secondary | ICD-10-CM | POA: Diagnosis not present

## 2022-12-01 DIAGNOSIS — M818 Other osteoporosis without current pathological fracture: Secondary | ICD-10-CM

## 2022-12-01 DIAGNOSIS — R7303 Prediabetes: Secondary | ICD-10-CM | POA: Insufficient documentation

## 2022-12-01 DIAGNOSIS — M2550 Pain in unspecified joint: Secondary | ICD-10-CM | POA: Diagnosis not present

## 2022-12-01 DIAGNOSIS — I10 Essential (primary) hypertension: Secondary | ICD-10-CM | POA: Insufficient documentation

## 2022-12-01 DIAGNOSIS — Z79899 Other long term (current) drug therapy: Secondary | ICD-10-CM | POA: Insufficient documentation

## 2022-12-01 DIAGNOSIS — G252 Other specified forms of tremor: Secondary | ICD-10-CM

## 2022-12-01 NOTE — Progress Notes (Signed)
Patient ID: Kimberly [name], female    DOB: 11-25-1955  MRN: [id number]  CC: Chronic disease management   Subjective: Kimberly [name] is a 67 y.o. 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   C/o Pain in wrist, fingers, feet, legs intermittently.   No swelling in jts On Tamoxifen Not taking anything for pain Last bone density study done 09/2019 revealed osteoporosis with T-score of -3.5 in the lumbar spine and -2.5 in the right femoral bone..  Currently not on any bisphosphonates.  She takes a multivitamin tablet but does not know if it has calcium and vitamin D.  Also reports hurting in gum/teeth at times.  None hurting currently Has not seen a dentist in a while.  HTN:  taking Norvasc and Atenolol.  Limits salt in foods Does not check BP No CP/SOB/LE edema  Tremor/dystonia: Last saw the neurologist at [facility] in October.  She is continued on Artane 1 mg twice a day.  Not sure if it helps.  They also had her see a psychologist to help with anxiety management.  She continues on BuSpar.   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   Intention tremor 10/15/2017   Diabetes mellitus screening 06/10/2017   Breast nodule 03/02/2016   Seasonal allergies 01/07/2016   Right shoulder pain 09/28/2015   Chronic insomnia 07/06/2015   Tinea pedis of both feet 07/06/2015   Nocturia  08/25/2014   Chronic anxiety 08/25/2014   Heme positive stool 08/16/2014   HTN (hypertension) 08/16/2014     Current Outpatient Medications on File Prior to Visit  Medication Sig Dispense Refill   amLODipine (NORVASC) 10 MG tablet TAKE 1 TABLET(10 MG) BY MOUTH DAILY 90 tablet 0   atenolol (TENORMIN) 25 MG tablet TAKE 1 TABLET(25 MG) BY MOUTH DAILY 90 tablet 2   Blood Pressure Monitor DEVI Use as directed to check home blood pressure 2-3 times a week 1 Device 0   busPIRone (BUSPAR) 15 MG tablet Take by mouth.     influenza vaccine adjuvanted (FLUAD QUADRIVALENT) 0.5 ML injection Inject into the muscle. 0.5 mL 0   Multiple Vitamin (MULTIVITAMIN WITH MINERALS) TABS tablet Take 1 tablet by mouth daily.      tamoxifen (NOLVADEX) 20 MG tablet 1 tablet (20 mg total). 90 tablet 3   traZODone (DESYREL) 50 MG tablet TAKE 1 AND 1/2 TABLETS BY MOUTH EVERY NIGHT AT BEDTIME 135 tablet 0   trihexyphenidyl (ARTANE) 2 MG tablet Take by mouth.     No current facility-administered medications on file prior to visit.    Allergies  Allergen Reactions   Codeine Other (See Comments)    Cold sweats    Social History   Socioeconomic History   Marital status: Single    Spouse name: Not on file   Number of children: 2   Years of education: Not on file  Highest education level: 12th grade  Occupational History   Not on file  Tobacco Use   Smoking status: Never   Smokeless tobacco: Never  Vaping Use   Vaping Use: Never used  Substance and Sexual Activity   Alcohol use: No    Alcohol/week: 0.0 standard drinks of alcohol   Drug use: No   Sexual activity: Not Currently    Birth control/protection: Post-menopausal  Other Topics Concern   Not on file  Social History Narrative   Lives with daughter, 5 grandchildren   Social Determinants of Health   Financial Resource Strain: Low Risk  (01/02/2021)   Overall Financial Resource Strain (CARDIA)    Difficulty of Paying Living Expenses: Not hard at  all  Food Insecurity: No Food Insecurity (03/12/2022)   Hunger Vital Sign    Worried About Running Out of Food in the Last Year: Never true    Ran Out of Food in the Last Year: Never true  Transportation Needs: No Transportation Needs (03/12/2022)   PRAPARE - Hydrologist (Medical): No    Lack of Transportation (Non-Medical): No  Physical Activity: Inactive (03/12/2022)   Exercise Vital Sign    Days of Exercise per Week: 0 days    Minutes of Exercise per Session: 0 min  Stress: Stress Concern Present (03/12/2022)   [place]    Feeling of Stress : To some extent  Social Connections: Moderately Integrated (03/12/2022)   Social Connection and Isolation Panel [NHANES]    Frequency of Communication with Friends and Family: Twice a week    Frequency of Social Gatherings with Friends and Family: More than three times a week    Attends Religious Services: More than 4 times per year    Active Member of Clubs or Organizations: Yes    Attends Archivist Meetings: More than 4 times per year    Marital Status: Never married  Intimate Partner Violence: Not At Risk (03/12/2022)   Humiliation, Afraid, Rape, and Kick questionnaire    Fear of Current or Ex-Partner: No    Emotionally Abused: No    Physically Abused: No    Sexually Abused: No    Family History  Problem Relation Age of Onset   Breast cancer Mother 36   Cancer Maternal Grandmother        thinks it was braest, but not sure what type of cancer, dx >50   Heart disease Paternal Grandfather    Lung cancer Maternal Aunt    Colon cancer Neg Hx     Past Surgical History:  Procedure Laterality Date   APPENDECTOMY     bowel obstruction     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   BREAST LUMPECTOMY WITH RADIOACTIVE SEED LOCALIZATION Left 10/14/2018   Procedure: BREAST LUMPECTOMY WITH RADIOACTIVE SEED LOCALIZATION X2;  Surgeon: Erroll Luna, MD;  Location: [facility];  Service: General;  Laterality: Left;   CHOLECYSTECTOMY     COLONOSCOPY WITH PROPOFOL N/A 08/05/2018   Procedure: COLONOSCOPY WITH PROPOFOL;  Surgeon: Daneil Dolin, MD;  [facility];  Service: Endoscopy;  Laterality: N/A;  12:45pm   TONSILLECTOMY      ROS: Review of Systems Negative except as stated above  PHYSICAL EXAM: BP 122/81 (BP Location: Left Arm, Patient Position: Sitting, Cuff Size: Normal)   Pulse 77   Ht 5' 3.5" (1.613 m)   Wt 158 lb 12.8 oz (72 kg)  SpO2 96%   BMI 27.69 kg/m   Physical Exam  General appearance - alert, well appearing, pleasant elderly female and in no distress Mental status -patient is a bit forgetful. Mouth -oral mucosa is moist.  I do not see any signs of dental abscess. Neck - supple, no significant adenopathy Chest - clear to auscultation, no wheezes, rales or rhonchi, symmetric air entry Heart - normal rate, regular rhythm, normal S1, S2, no murmurs, rubs, clicks or gallops Extremities -no lower extremity edema. MSK: No signs of active tenosynovitis of the wrist and knuckles or other joints in the hands.  She has good range of motion of both ankles Neuro: She is constantly moving the entire body especially the head     Latest Ref Rng & Units 11/25/2021    2:22 PM 11/22/2020    2:30 PM 04/19/2020    2:32 PM  CMP  Glucose 70 - 99 mg/dL 81  86  97   BUN 8 - 27 mg/dL 13  16  17   $ Creatinine 0.57 - 1.00 mg/dL 0.78  0.79  1.00   Sodium 134 - 144 mmol/L 140  140  141   Potassium 3.5 - 5.2 mmol/L 5.0  5.0  4.4   Chloride 96 - 106 mmol/L 104  104  105   CO2 20 - 29 mmol/L 22  16  26   $ Calcium 8.7 - 10.3 mg/dL 9.8  10.0  10.0   Total Protein 6.0 - 8.5 g/dL 7.7  7.5    Total Bilirubin 0.0 - 1.2 mg/dL <0.2  <0.2    Alkaline Phos 44 - 121 IU/L 67  64    AST 0 - 40 IU/L 19  23    ALT 0 - 32 IU/L 12  15     Lipid Panel     Component Value Date/Time   CHOL 185 11/25/2021 1422   TRIG 154 (H) 11/25/2021  1422   HDL 73 11/25/2021 1422   CHOLHDL 2.5 11/25/2021 1422   CHOLHDL 2.7 11/13/2016 1102   VLDL 25 11/13/2016 1102   LDLCALC 86 11/25/2021 1422    CBC    Component Value Date/Time   WBC 8.3 11/25/2021 1422   WBC 6.1 10/08/2018 0919   RBC 4.87 11/25/2021 1422   RBC 4.84 10/08/2018 0919   HGB 14.8 11/25/2021 1422   HCT 44.3 11/25/2021 1422   PLT 426 11/25/2021 1422   MCV 91 11/25/2021 1422   MCH 30.4 11/25/2021 1422   MCH 30.4 10/08/2018 0919   MCHC 33.4 11/25/2021 1422   MCHC 32.7 10/08/2018 0919   RDW 12.3 11/25/2021 1422   LYMPHSABS 1.9 10/08/2018 0919   MONOABS 0.6 10/08/2018 0919   EOSABS 0.2 10/08/2018 0919   BASOSABS 0.1 10/08/2018 0919    ASSESSMENT AND PLAN: 1. Essential hypertension Close to goal.  Continue atenolol and Norvasc. - CBC - Comprehensive metabolic panel - Lipid panel  2. Polyarthralgia We will screen for RA given the joints involved.  Could be due to tamoxifen.  Recommend use of Tylenol as needed. - Rheumatoid factor - CYCLIC CITRUL PEPTIDE ANTIBODY, IGG/IGA  3. Other osteoporosis without current pathological fracture I wrote down for her to purchase Os-Cal plus vitamin D 600/200 mg over-the-counter and take 1 tablet twice a day. She is due for update bone density study.  Will also refer her to endocrinology - VITAMIN D 25 Hydroxy (Vit-D Deficiency, Fractures) - DG Bone Density; Future  4. Generalized anxiety disorder Continue BuSpar.  5.  Dystonia 6. Coarse tremors Followed by neurology at [facility].     Patient was given the opportunity to ask questions.  Patient verbalized understanding of the plan and was able to repeat key elements of the plan.   This documentation was completed using Radio producer.  Any transcriptional errors are unintentional.  No orders of the defined types were placed in this encounter.    Requested Prescriptions    No prescriptions requested or ordered in this encounter     No follow-ups on file.  Karle Plumber, MD, FACP

## 2022-12-01 NOTE — Patient Instructions (Signed)
I would like for you to purchase vitamin D plus calcium over-the-counter and take 1 tablet twice a day.  The name of the medication is OSCAL + VITAMIN D 673m/200mg one tablet twice a day.

## 2022-12-02 NOTE — Progress Notes (Signed)
Let patient know that her blood cell counts are normal.  Kidney and liver function tests good.  Cholesterol levels normal.  Vitamin D level normal.  Initial screening test for rheumatoid arthritis negative.

## 2022-12-03 LAB — CBC
Hematocrit: 43.1 % (ref 34.0–46.6)
Hemoglobin: 14.3 g/dL (ref 11.1–15.9)
MCH: 30.2 pg (ref 26.6–33.0)
MCHC: 33.2 g/dL (ref 31.5–35.7)
MCV: 91 fL (ref 79–97)
Platelets: 428 10*3/uL (ref 150–450)
RBC: 4.74 x10E6/uL (ref 3.77–5.28)
RDW: 13 % (ref 11.7–15.4)
WBC: 7.4 10*3/uL (ref 3.4–10.8)

## 2022-12-03 LAB — CYCLIC CITRUL PEPTIDE ANTIBODY, IGG/IGA: Cyclic Citrullin Peptide Ab: 4 units (ref 0–19)

## 2022-12-03 LAB — LIPID PANEL
Chol/HDL Ratio: 2.8 ratio (ref 0.0–4.4)
Cholesterol, Total: 184 mg/dL (ref 100–199)
HDL: 66 mg/dL (ref >39)
LDL Chol Calc (NIH): 97 mg/dL (ref 0–99)
Triglycerides: 122 mg/dL (ref 0–149)
VLDL Cholesterol Cal: 21 mg/dL (ref 5–40)

## 2022-12-03 LAB — COMPREHENSIVE METABOLIC PANEL
ALT: 13 IU/L (ref 0–32)
AST: 21 IU/L (ref 0–40)
Albumin/Globulin Ratio: 1.5 (ref 1.2–2.2)
Albumin: 4.4 g/dL (ref 3.9–4.9)
Alkaline Phosphatase: 68 IU/L (ref 44–121)
BUN/Creatinine Ratio: 12 (ref 12–28)
BUN: 10 mg/dL (ref 8–27)
Bilirubin Total: <0.2 mg/dL (ref 0.0–1.2)
CO2: 20 mmol/L (ref 20–29)
Calcium: 9.7 mg/dL (ref 8.7–10.3)
Chloride: 105 mmol/L (ref 96–106)
Creatinine, Ser: 0.81 mg/dL (ref 0.57–1.00)
Globulin, Total: 3 g/dL (ref 1.5–4.5)
Glucose: 90 mg/dL (ref 70–99)
Potassium: 4.7 mmol/L (ref 3.5–5.2)
Sodium: 141 mmol/L (ref 134–144)
Total Protein: 7.4 g/dL (ref 6.0–8.5)
eGFR: 79 mL/min/{1.73_m2} (ref >59)

## 2022-12-03 LAB — RHEUMATOID FACTOR: Rheumatoid fact SerPl-aCnc: <10 IU/mL (ref <14.0)

## 2022-12-03 LAB — VITAMIN D 25 HYDROXY (VIT D DEFICIENCY, FRACTURES): Vit D, 25-Hydroxy: 43.2 ng/mL (ref 30.0–100.0)

## 2022-12-04 ENCOUNTER — Other Ambulatory Visit: Payer: Self-pay | Admitting: Hematology and Oncology

## 2022-12-10 ENCOUNTER — Other Ambulatory Visit: Payer: Self-pay | Admitting: Internal Medicine

## 2022-12-10 DIAGNOSIS — F320 Major depressive disorder, single episode, mild: Secondary | ICD-10-CM

## 2023-01-09 ENCOUNTER — Other Ambulatory Visit: Payer: Self-pay | Admitting: Internal Medicine

## 2023-01-13 ENCOUNTER — Other Ambulatory Visit: Payer: Medicare Other

## 2023-01-25 ENCOUNTER — Other Ambulatory Visit: Payer: Self-pay | Admitting: Internal Medicine

## 2023-02-19 ENCOUNTER — Ambulatory Visit: Payer: Medicare Other | Admitting: Podiatry

## 2023-03-26 ENCOUNTER — Other Ambulatory Visit: Payer: Self-pay | Admitting: Internal Medicine

## 2023-04-03 ENCOUNTER — Telehealth: Payer: Self-pay | Admitting: *Deleted

## 2023-04-03 NOTE — Telephone Encounter (Signed)
Calling requesting transportation to md visit on [id number].  Unable to assist on that date. Phone numbers for local transportation given.

## 2023-04-07 NOTE — Progress Notes (Unsigned)
Subjective:   Kimberly [name] is a 67 y.o. female who presents for Medicare Annual (Subsequent) preventive examination.  Visit Complete: {VISITMETHOD:[phone]}  Patient Medicare AWV questionnaire was completed by the patient on ***; I have confirmed that all information answered by patient is correct and no changes since this date.  Review of Systems    ***       Objective:    There were no vitals filed for this visit. There is no height or weight on file to calculate BMI.     09/11/2022   11:30 AM 03/12/2022   11:31 AM 08/08/2021   11:08 AM 01/02/2021    3:30 PM 06/05/2020   11:06 AM 11/02/2018    1:08 PM 10/08/2018    9:17 AM  Advanced Directives  Does Patient Have a Medical Advance Directive? No Yes No No No No No  Type of Advance Directive  Healthcare Power of Attorney       Would patient like information on creating a medical advance directive? No - Patient declined  No - Patient declined No - Patient declined No - Patient declined No - Patient declined No - Patient declined    Current Medications (verified) Outpatient Encounter Medications as of 04/08/2023  Medication Sig   amLODipine (NORVASC) 10 MG tablet TAKE 1 TABLET(10 MG) BY MOUTH DAILY   atenolol (TENORMIN) 25 MG tablet TAKE 1 TABLET(25 MG) BY MOUTH DAILY   Blood Pressure Monitor DEVI Use as directed to check home blood pressure 2-3 times a week   busPIRone (BUSPAR) 15 MG tablet TAKE 1 TABLET(15 MG) BY MOUTH TWICE DAILY   influenza vaccine adjuvanted (FLUAD QUADRIVALENT) 0.5 ML injection Inject into the muscle.   Multiple Vitamin (MULTIVITAMIN WITH MINERALS) TABS tablet Take 1 tablet by mouth daily.    tamoxifen (NOLVADEX) 20 MG tablet TAKE 1 TABLET(20 MG) BY MOUTH DAILY   traZODone (DESYREL) 50 MG tablet TAKE 1 AND 1/2 TABLETS BY MOUTH EVERY NIGHT AT BEDTIME   trihexyphenidyl (ARTANE) 2 MG tablet Take by mouth.   No facility-administered encounter medications on file as of 04/08/2023.    Allergies  (verified) Codeine   History: Past Medical History:  Diagnosis Date   Anxiety 1995   Breast cancer (HCC) 2020   Left Breast Cancer   Cancer (HCC)    breast left   Depression 1995   Family history of breast cancer    Family history of lung cancer    History of kidney stones    History of rheumatic fever as a child    Hypertension Dx Dec 2015   Panic attack 1995   Personal history of radiation therapy 2020   Left Breast Cancer   Tremor    Past Surgical History:  Procedure Laterality Date   APPENDECTOMY     bowel obstruction     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   BREAST LUMPECTOMY WITH RADIOACTIVE SEED LOCALIZATION Left 10/14/2018   Procedure: BREAST LUMPECTOMY WITH RADIOACTIVE SEED LOCALIZATION X2;  Surgeon: Harriette Bouillon, MD;  Location: [facility];  Service: General;  Laterality: Left;   CHOLECYSTECTOMY     COLONOSCOPY WITH PROPOFOL N/A 08/05/2018   Procedure: COLONOSCOPY WITH PROPOFOL;  Surgeon: Corbin Ade, MD;  [facility];  Service: Endoscopy;  Laterality: N/A;  12:45pm   TONSILLECTOMY     Family History  Problem Relation Age of Onset   Breast cancer Mother 52   Cancer Maternal Grandmother        thinks  it was braest, but not sure what type of cancer, dx >50   Heart disease Paternal Grandfather    Lung cancer Maternal Aunt    Colon cancer Neg Hx    Social History   Socioeconomic History   Marital status: Single    Spouse name: Not on file   Number of children: 2   Years of education: Not on file   Highest education level: 12th grade  Occupational History   Not on file  Tobacco Use   Smoking status: Never   Smokeless tobacco: Never  Vaping Use   Vaping Use: Never used  Substance and Sexual Activity   Alcohol use: No    Alcohol/week: 0.0 standard drinks of alcohol   Drug use: No   Sexual activity: Not Currently    Birth control/protection: Post-menopausal  Other Topics Concern   Not on file  Social History Narrative   Lives with  daughter, 5 grandchildren   Social Determinants of Health   Financial Resource Strain: Low Risk  (01/02/2021)   Overall Financial Resource Strain (CARDIA)    Difficulty of Paying Living Expenses: Not hard at all  Food Insecurity: No Food Insecurity (03/12/2022)   Hunger Vital Sign    Worried About Running Out of Food in the Last Year: Never true    Ran Out of Food in the Last Year: Never true  Transportation Needs: No Transportation Needs (03/12/2022)   PRAPARE - Administrator, Civil Service (Medical): No    Lack of Transportation (Non-Medical): No  Physical Activity: Inactive (03/12/2022)   Exercise Vital Sign    Days of Exercise per Week: 0 days    Minutes of Exercise per Session: 0 min  Stress: Stress Concern Present (03/12/2022)   [employer] of Occupational Health - Occupational Stress Questionnaire    Feeling of Stress : To some extent  Social Connections: Moderately Integrated (03/12/2022)   Social Connection and Isolation Panel [NHANES]    Frequency of Communication with Friends and Family: Twice a week    Frequency of Social Gatherings with Friends and Family: More than three times a week    Attends Religious Services: More than 4 times per year    Active Member of [employer] or Organizations: Yes    Attends Engineer, structural: More than 4 times per year    Marital Status: Never married    Tobacco Counseling Counseling given: Not Answered   Clinical Intake:                        Activities of Daily Living     No data to display          Patient Care Team: Marcine Matar, MD as PCP - General (Internal Medicine) Jena Gauss, Gerrit Friends, MD as Consulting Physician (Gastroenterology) Harriette Bouillon, MD as Consulting Physician (General Surgery) Serena Croissant, MD as Consulting Physician (Hematology and Oncology) Dorothy Puffer, MD as Consulting Physician (Radiation Oncology)  Indicate any recent Medical Services you may have received  from other than [facility] providers in the past year (date may be approximate).     Assessment:   This is a routine wellness examination for Kimberly [name].  Hearing/Vision screen No results found.  Dietary issues and exercise activities discussed:     Goals Addressed   None    Depression Screen    12/01/2022   11:49 AM 07/29/2022   11:29 AM 07/29/2022   11:28 AM 03/27/2022   11:23 AM  03/12/2022   11:25 AM 12/23/2021    6:15 PM 11/04/2021   11:22 AM  PHQ 2/9 Scores  PHQ - 2 Score 5 3 3 3 1 2 3   PHQ- 9 Score 9 7 7 7  9 12     Fall Risk    12/01/2022   11:49 AM 07/29/2022   10:57 AM 03/12/2022   11:31 AM 01/30/2022    9:50 AM 11/25/2021    1:41 PM  Fall Risk   Falls in the past year? 0 0 0 0 0  Number falls in past yr: 0 0 0  0  Injury with Fall? 0 0 0  0  Risk for fall due to : No Fall Risks No Fall Risks No Fall Risks  No Fall Risks  Follow up   Falls evaluation completed      MEDICARE RISK AT HOME:   TIMED UP AND GO:  Was the test performed?  No    Cognitive Function:    01/02/2021    3:55 PM  MMSE - Mini Mental State Exam  Orientation to time 5  Orientation to Place 5  Registration 3  Attention/ Calculation 5  Recall 3  Language- name 2 objects 2  Language- repeat 1  Language- follow 3 step command 3  Language- read & follow direction 1  Write a sentence 1  Copy design 1  Total score 30        03/12/2022   11:34 AM  6CIT Screen  What Year? 0 points  What month? 0 points  What time? 0 points  Count back from 20 0 points  Months in reverse 0 points  Repeat phrase 0 points  Total Score 0 points    Immunizations Immunization History  Administered Date(s) Administered   Fluad Quad(high Dose 65+) 07/18/2022   Influenza,inj,Quad PF,6+ Mos 09/21/2014, 07/07/2016, 06/11/2017, 06/25/2021   Influenza-Unspecified 06/28/2019, 07/03/2020   Moderna SARS-COV2 Booster Vaccination 10/25/2020   Moderna Sars-Covid-2 Vaccination 11/28/2019, 12/27/2019   Pneumococcal  Conjugate-13 11/22/2020   Pneumococcal Polysaccharide-23 09/22/2014, 03/27/2022   Tdap 08/11/2016    TDAP status: Up to date  Pneumococcal vaccine status: Up to date  Covid-19 vaccine status: Information provided on how to obtain vaccines.   Qualifies for Shingles Vaccine? Yes   Zostavax completed No   Shingrix Completed?: No.    Education has been provided regarding the importance of this vaccine. Patient has been advised to call insurance company to determine out of pocket expense if they have not yet received this vaccine. Advised may also receive vaccine at local pharmacy or Health Dept. Verbalized acceptance and understanding.  Screening Tests Health Maintenance  Topic Date Due   COVID-19 Vaccine (3 - Moderna risk series) 11/22/2020   Medicare Annual Wellness (AWV)  03/13/2023   Zoster Vaccines- Shingrix (1 of 2) 07/30/2023 (Originally 10/07/1975)   INFLUENZA VACCINE  05/14/2023   MAMMOGRAM  10/21/2024   DTaP/Tdap/Td (2 - Td or Tdap) 08/11/2026   Colonoscopy  08/05/2028   Pneumonia Vaccine 77+ Years old  Completed   DEXA SCAN  Completed   Hepatitis C Screening  Completed   HPV VACCINES  Aged Out    Health Maintenance  Health Maintenance Due  Topic Date Due   COVID-19 Vaccine (3 - Moderna risk series) 11/22/2020   Medicare Annual Wellness (AWV)  03/13/2023    Colorectal cancer screening: Type of screening: Colonoscopy. Completed 08/05/18. Repeat every 10 years  Mammogram status: Completed 10/21/22. Repeat every year  Bone Density status: Ordered  and scheduled for 06/03/23. Pt provided with contact info and advised to call to schedule appt.  Lung Cancer Screening: (Low Dose CT Chest recommended if Age 91-80 years, 20 pack-year currently smoking OR have quit w/in 15years.) does not qualify.   Lung Cancer Screening Referral: n/a  Additional Screening:  Hepatitis C Screening: does qualify; Completed 01/07/16  Vision Screening: Recommended annual ophthalmology exams  for early detection of glaucoma and other disorders of the eye. Is the patient up to date with their annual eye exam?  {YES/NO:21197} Who is the provider or what is the name of the office in which the patient attends annual eye exams? *** If pt is not established with a provider, would they like to be referred to a provider to establish care? {YES/NO:21197}.   Dental Screening: Recommended annual dental exams for proper oral hygiene  Community Resource Referral / Chronic Care Management: CRR required this visit?  {YES/NO:21197}  CCM required this visit?  {CCM Required choices:[phone]}     Plan:     I have personally reviewed and noted the following in the patient's chart:   Medical and social history Use of alcohol, tobacco or illicit drugs  Current medications and supplements including opioid prescriptions. {Opioid Prescriptions:[phone]} Functional ability and status Nutritional status Physical activity Advanced directives List of other physicians Hospitalizations, surgeries, and ER visits in previous 12 months Vitals Screenings to include cognitive, depression, and falls Referrals and appointments  In addition, I have reviewed and discussed with patient certain preventive protocols, quality metrics, and best practice recommendations. A written personalized care plan for preventive services as well as general preventive health recommendations were provided to patient.     Kandis Fantasia [place], [place]   04/11/1600   After Visit Summary: {CHL AMB AWV After Visit Summary:[phone]}  Nurse Notes: ***

## 2023-04-07 NOTE — Patient Instructions (Incomplete)
Kimberly [name] , Thank you for taking time to come for your Medicare Wellness Visit. I appreciate your ongoing commitment to your health goals. Please review the following plan we discussed and let me know if I can assist you in the future.   These are the goals we discussed:  Goals      Blood Pressure < 140/90     Exercise 3x per week (30 min per time)        This is a list of the screening recommended for you and due dates:  Health Maintenance  Topic Date Due   COVID-19 Vaccine (3 - Moderna risk series) 11/22/2020   Medicare Annual Wellness Visit  03/13/2023   Zoster (Shingles) Vaccine (1 of 2) 07/30/2023*   Flu Shot  05/14/2023   Mammogram  10/21/2024   DTaP/Tdap/Td vaccine (2 - Td or Tdap) 08/11/2026   Colon Cancer Screening  08/05/2028   Pneumonia Vaccine  Completed   DEXA scan (bone density measurement)  Completed   Hepatitis C Screening  Completed   HPV Vaccine  Aged Out  *Topic was postponed. The date shown is not the original due date.    Advanced directives: Information on Advanced Care Planning can be found at [facility] Advance Health Care Directives Advance Health Care Directives ([email or web address] Please bring a copy of your health care power of attorney and living will to the office to be added to your chart at your convenience.  Conditions/risks identified: Aim for 30 minutes of exercise or brisk walking, 6-8 glasses of water, and 5 servings of fruits and vegetables each day.  Next appointment: Follow up in one year for your annual wellness visit    Preventive Care 65 Years and Older, Female Preventive care refers to lifestyle choices and visits with your health care provider that can promote health and wellness. What does preventive care include? A yearly physical exam. This is also called an annual well check. Dental exams once or twice a year. Routine eye exams. Ask your health care provider how often you should have your eyes  checked. Personal lifestyle choices, including: Daily care of your teeth and gums. Regular physical activity. Eating a healthy diet. Avoiding tobacco and drug use. Limiting alcohol use. Practicing safe sex. Taking low-dose aspirin every day. Taking vitamin and mineral supplements as recommended by your health care provider. What happens during an annual well check? The services and screenings done by your health care provider during your annual well check will depend on your age, overall health, lifestyle risk factors, and family history of disease. Counseling  Your health care provider may ask you questions about your: Alcohol use. Tobacco use. Drug use. Emotional well-being. Home and relationship well-being. Sexual activity. Eating habits. History of falls. Memory and ability to understand (cognition). Work and work Astronomer. Reproductive health. Screening  You may have the following tests or measurements: Height, weight, and BMI. Blood pressure. Lipid and cholesterol levels. These may be checked every 5 years, or more frequently if you are over 2 years old. Skin check. Lung cancer screening. You may have this screening every year starting at age 48 if you have a 30-pack-year history of smoking and currently smoke or have quit within the past 15 years. Fecal occult blood test (FOBT) of the stool. You may have this test every year starting at age 72. Flexible sigmoidoscopy or colonoscopy. You may have a sigmoidoscopy every 5 years or a colonoscopy every 10 years starting at age 6. Hepatitis  C blood test. Hepatitis B blood test. Sexually transmitted disease (STD) testing. Diabetes screening. This is done by checking your blood sugar (glucose) after you have not eaten for a while (fasting). You may have this done every 1-3 years. Bone density scan. This is done to screen for osteoporosis. You may have this done starting at age 62. Mammogram. This may be done every 1-2  years. Talk to your health care provider about how often you should have regular mammograms. Talk with your health care provider about your test results, treatment options, and if necessary, the need for more tests. Vaccines  Your health care provider may recommend certain vaccines, such as: Influenza vaccine. This is recommended every year. Tetanus, diphtheria, and acellular pertussis (Tdap, Td) vaccine. You may need a Td booster every 10 years. Zoster vaccine. You may need this after age 78. Pneumococcal 13-valent conjugate (PCV13) vaccine. One dose is recommended after age 24. Pneumococcal polysaccharide (PPSV23) vaccine. One dose is recommended after age 79. Talk to your health care provider about which screenings and vaccines you need and how often you need them. This information is not intended to replace advice given to you by your health care provider. Make sure you discuss any questions you have with your health care provider. Document Released: 10/26/2015 Document Revised: 06/18/2016 Document Reviewed: 07/31/2015 Elsevier Interactive Patient Education  2017 [employer].  Fall Prevention in the Home Falls can cause injuries. They can happen to people of all ages. There are many things you can do to make your home safe and to help prevent falls. What can I do on the outside of my home? Regularly fix the edges of walkways and driveways and fix any cracks. Remove anything that might make you trip as you walk through a door, such as a raised step or threshold. Trim any bushes or trees on the path to your home. Use bright outdoor lighting. Clear any walking paths of anything that might make someone trip, such as rocks or tools. Regularly check to see if handrails are loose or broken. Make sure that both sides of any steps have handrails. Any raised decks and porches should have guardrails on the edges. Have any leaves, snow, or ice cleared regularly. Use sand or salt on walking paths  during winter. Clean up any spills in your garage right away. This includes oil or grease spills. What can I do in the bathroom? Use night lights. Install grab bars by the toilet and in the tub and shower. Do not use towel bars as grab bars. Use non-skid mats or decals in the tub or shower. If you need to sit down in the shower, use a plastic, non-slip stool. Keep the floor dry. Clean up any water that spills on the floor as soon as it happens. Remove soap buildup in the tub or shower regularly. Attach bath mats securely with double-sided non-slip rug tape. Do not have throw rugs and other things on the floor that can make you trip. What can I do in the bedroom? Use night lights. Make sure that you have a light by your bed that is easy to reach. Do not use any sheets or blankets that are too big for your bed. They should not hang down onto the floor. Have a firm chair that has side arms. You can use this for support while you get dressed. Do not have throw rugs and other things on the floor that can make you trip. What can I do in the  kitchen? Clean up any spills right away. Avoid walking on wet floors. Keep items that you use a lot in easy-to-reach places. If you need to reach something above you, use a strong step stool that has a grab bar. Keep electrical cords out of the way. Do not use floor polish or wax that makes floors slippery. If you must use wax, use non-skid floor wax. Do not have throw rugs and other things on the floor that can make you trip. What can I do with my stairs? Do not leave any items on the stairs. Make sure that there are handrails on both sides of the stairs and use them. Fix handrails that are broken or loose. Make sure that handrails are as long as the stairways. Check any carpeting to make sure that it is firmly attached to the stairs. Fix any carpet that is loose or worn. Avoid having throw rugs at the top or bottom of the stairs. If you do have throw  rugs, attach them to the floor with carpet tape. Make sure that you have a light switch at the top of the stairs and the bottom of the stairs. If you do not have them, ask someone to add them for you. What else can I do to help prevent falls? Wear shoes that: Do not have high heels. Have rubber bottoms. Are comfortable and fit you well. Are closed at the toe. Do not wear sandals. If you use a stepladder: Make sure that it is fully opened. Do not climb a closed stepladder. Make sure that both sides of the stepladder are locked into place. Ask someone to hold it for you, if possible. Clearly mark and make sure that you can see: Any grab bars or handrails. First and last steps. Where the edge of each step is. Use tools that help you move around (mobility aids) if they are needed. These include: Canes. Walkers. Scooters. Crutches. Turn on the lights when you go into a dark area. Replace any light bulbs as soon as they burn out. Set up your furniture so you have a clear path. Avoid moving your furniture around. If any of your floors are uneven, fix them. If there are any pets around you, be aware of where they are. Review your medicines with your doctor. Some medicines can make you feel dizzy. This can increase your chance of falling. Ask your doctor what other things that you can do to help prevent falls. This information is not intended to replace advice given to you by your health care provider. Make sure you discuss any questions you have with your health care provider. Document Released: 07/26/2009 Document Revised: 03/06/2016 Document Reviewed: 11/03/2014 Elsevier Interactive Patient Education  2017 [employer].

## 2023-04-08 ENCOUNTER — Ambulatory Visit: Payer: Medicare Other | Attending: Internal Medicine

## 2023-04-08 VITALS — Ht 63.5 in | Wt 158.0 lb

## 2023-04-08 DIAGNOSIS — Z0000 Encounter for general adult medical examination without abnormal findings: Secondary | ICD-10-CM

## 2023-04-10 ENCOUNTER — Ambulatory Visit: Payer: Medicare Other | Attending: Internal Medicine | Admitting: Internal Medicine

## 2023-04-10 DIAGNOSIS — I10 Essential (primary) hypertension: Secondary | ICD-10-CM | POA: Diagnosis not present

## 2023-04-10 DIAGNOSIS — G250 Essential tremor: Secondary | ICD-10-CM | POA: Diagnosis not present

## 2023-04-10 DIAGNOSIS — R7303 Prediabetes: Secondary | ICD-10-CM | POA: Insufficient documentation

## 2023-04-10 DIAGNOSIS — G4700 Insomnia, unspecified: Secondary | ICD-10-CM | POA: Insufficient documentation

## 2023-04-10 DIAGNOSIS — G252 Other specified forms of tremor: Secondary | ICD-10-CM

## 2023-04-10 DIAGNOSIS — Z79810 Long term (current) use of selective estrogen receptor modulators (SERMs): Secondary | ICD-10-CM | POA: Insufficient documentation

## 2023-04-10 DIAGNOSIS — J302 Other seasonal allergic rhinitis: Secondary | ICD-10-CM

## 2023-04-10 DIAGNOSIS — Z853 Personal history of malignant neoplasm of breast: Secondary | ICD-10-CM | POA: Insufficient documentation

## 2023-04-10 DIAGNOSIS — M2550 Pain in unspecified joint: Secondary | ICD-10-CM

## 2023-04-10 DIAGNOSIS — M818 Other osteoporosis without current pathological fracture: Secondary | ICD-10-CM | POA: Diagnosis not present

## 2023-04-10 DIAGNOSIS — R1084 Generalized abdominal pain: Secondary | ICD-10-CM

## 2023-04-10 MED ORDER — FLUTICASONE PROPIONATE 50 MCG/ACT NA SUSP
1.0000 | Freq: Every day | NASAL | 6 refills | Status: AC | PRN
Start: 2023-04-10 — End: ?

## 2023-04-10 MED ORDER — LORATADINE 10 MG PO TABS
10.0000 mg | ORAL_TABLET | Freq: Every day | ORAL | 1 refills | Status: DC | PRN
Start: 2023-04-10 — End: 2023-09-14

## 2023-04-10 MED ORDER — AMLODIPINE BESYLATE 10 MG PO TABS: ORAL_TABLET | ORAL | 1 refills | Status: DC

## 2023-04-10 NOTE — Progress Notes (Signed)
Patient ID: Kimberly [name], female   DOB: 07-06-1956, 67 y.o.   MRN: [id number] Virtual Visit via Video Note  I connected with Woodroe Mode on 04/10/2023 at 11:36 AM by a video enabled telemedicine application and verified that I am speaking with the correct person using two identifiers.  Location: Patient: home Provider: Office   I discussed the limitations of evaluation and management by telemedicine and the availability of in person appointments. The patient expressed understanding and agreed to proceed.  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   Osteoporosis:  Referred to endo on last visit.  She does not recall receiving an appointment.  On tamoxifen as adjuvant antiestrogen therapy for history of breast cancer.  Bone density scheduled for August. -Did not get vitamin D supplement OTC as recommended.  Tells me she is taking Olly MV.  It has 20 mcg of vitamin D in it.  Vitamin D level on last visit was 43.  HTN:  taking Norvasc and Atenolol.  Limits salt in foods Does not check BP  Complained of polyarthralgia symptoms on last visit.  Rheumatoid factor negative.  I felt that it may be due to tamoxifen.  Recommended Tylenol as needed.  Today she complains again of pain in the legs at times that do not last long but not associated with ambulation.  Some pain in the left wrist.  She has not been taking anything for it.  Wonders if she can take ibuprofen.  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  On Artane 2 mg.  Reports they want her to increase from 1 tablet twice a day to 2 tablets twice a day but she is afraid to do so.  She finds that the medication has helped in decreasing the coarse tremors especially at nights.  Reports mild coughing with sneezing and drainage at times.  No shortness of breath or  congestion.  Noted some "hurting"  not pain in the abdomen that occurs occasionally and wonders whether she may not be eating enough.  Denies any reflux symptoms.  The hurting is not associated with meals.  No nausea/vomiting.  Moving bowels okay. Outpatient Encounter Medications as of 04/10/2023  Medication Sig   amLODipine (NORVASC) 10 MG tablet TAKE 1 TABLET(10 MG) BY MOUTH DAILY   atenolol (TENORMIN) 25 MG tablet TAKE 1 TABLET(25 MG) BY MOUTH DAILY   Blood Pressure Monitor DEVI Use as directed to check home blood pressure 2-3 times a week   busPIRone (BUSPAR) 15 MG tablet TAKE 1 TABLET(15 MG) BY MOUTH TWICE DAILY   Multiple Vitamin (MULTIVITAMIN WITH MINERALS) TABS tablet Take 1 tablet by mouth daily.    tamoxifen (NOLVADEX) 20 MG tablet TAKE 1 TABLET(20 MG) BY MOUTH DAILY   traZODone (DESYREL) 50 MG tablet TAKE 1 AND 1/2 TABLETS BY MOUTH EVERY NIGHT AT BEDTIME   trihexyphenidyl (ARTANE) 2 MG tablet Take by mouth.   influenza vaccine adjuvanted (FLUAD QUADRIVALENT) 0.5 ML injection Inject into the muscle. (Patient not taking: Reported on 04/10/2023)   No facility-administered encounter medications on file as of 04/10/2023.      Observations/Objective: Elderly female sitting in chair in NAD.  She has constant and uncontrolled movements of the head  Assessment and Plan: 1. Essential hypertension Continue atenolol and Norvasc - amLODipine (NORVASC) 10 MG tablet; TAKE 1 TABLET(10 MG) BY MOUTH DAILY  Dispense: 90 tablet;  Refill: 1  2. Polyarthralgia Most likely related to tamoxifen.  Again advised trying Tylenol as needed.  Advised against ibuprofen given her age. - Ambulatory referral to Rheumatology  3. Coarse tremors Advised patient that if she is a bit afraid to increase Artane from 2 mg BID to 4 mg BID as mended by neurologist, she may try at least increasing to 2 mg in the morning and 4 mg in the evening for several weeks to see how she tolerates it.  If she tolerates it okay, then  can increase to the 4 mg twice a day.  She is agreeable to giving this a try.  4. Other osteoporosis without current pathological fracture On vitamin D3 multivitamin.  Vitamin D level was good on last visit.  Tamoxifen offers some protection from bone loss. However, I would still like her to see the endocrinologist.  Message sent to our referral coordinator inquiring about the appointment.  5. Seasonal allergies Sounds like cough may be due to allergies.  She is agreeable to giving a trial of Claritin and Flonase as needed. - loratadine (CLARITIN) 10 MG tablet; Take 1 tablet (10 mg total) by mouth daily as needed for allergies.  Dispense: 60 tablet; Refill: 1 - fluticasone (FLONASE) 50 MCG/ACT nasal spray; Place 1 spray into both nostrils daily as needed for allergies or rhinitis.  Dispense: 16 g; Refill: 6  6. Generalized abdominal pain Nonspecific and not occurring often.  I recommend that we observe for now.  Follow-up if it occurs more frequently.   Follow Up Instructions: 4 mths   I discussed the assessment and treatment plan with the patient. The patient was provided an opportunity to ask questions and all were answered. The patient agreed with the plan and demonstrated an understanding of the instructions.   The patient was advised to call back or seek an in-person evaluation if the symptoms worsen or if the condition fails to improve as anticipated.  I spent 30 minutes dedicated to the care of this patient on the date of this encounter to include previsit review of records, face-to-face time with patient discussing diagnosis and management and post visit placement of orders.  This note has been created with Education officer, environmental. Any transcriptional errors are unintentional.  Jonah Blue, MD

## 2023-04-15 ENCOUNTER — Telehealth: Payer: Self-pay | Admitting: Internal Medicine

## 2023-04-15 NOTE — Telephone Encounter (Signed)
-----   Message from Dionne Bucy sent at 04/14/2023  2:09 PM EDT ----- Regarding: RE: Endocrine Referral Good Afternoon  I contacted then  and  they are going to contact the patient  .   ----- Message ----- From: Marcine Matar, MD Sent: 04/10/2023  12:24 PM EDT To: Dionne Bucy Subject: Endocrine Referral                             Referred to endocrinology in February.  Patient states no appointment as yet.  Can you please look into this?

## 2023-04-21 ENCOUNTER — Other Ambulatory Visit: Payer: Self-pay | Admitting: Internal Medicine

## 2023-04-21 NOTE — Telephone Encounter (Signed)
Requested Prescriptions  Pending Prescriptions Disp Refills   busPIRone (BUSPAR) 15 MG tablet [Pharmacy Med Name: BUSPIRONE 15MG  TABLETS] 180 tablet 1    Sig: TAKE 1 TABLET(15 MG) BY MOUTH TWICE DAILY     Psychiatry: Anxiolytics/Hypnotics - Non-controlled Passed - 04/21/2023  7:22 AM      Passed - Valid encounter within last 12 months    Recent Outpatient Visits           1 week ago Essential hypertension   [place] [facility] Marcine Matar, MD   4 months ago Essential hypertension   [place] [facility] Marcine Matar, MD   8 months ago Essential hypertension   [place] [facility] Marcine Matar, MD   1 year ago Essential hypertension   [place] [facility] Marcine Matar, MD   1 year ago Essential hypertension   [place] [facility] Marcine Matar, MD       Future Appointments             In 3 months Laural Benes, Binnie Rail, MD [facility]

## 2023-05-29 ENCOUNTER — Other Ambulatory Visit: Payer: Self-pay | Admitting: Internal Medicine

## 2023-05-29 DIAGNOSIS — I10 Essential (primary) hypertension: Secondary | ICD-10-CM

## 2023-05-29 MED ORDER — ATENOLOL 25 MG PO TABS
ORAL_TABLET | ORAL | 1 refills | Status: DC
Start: 2023-05-29 — End: 2023-11-20

## 2023-05-29 NOTE — Telephone Encounter (Signed)
Requested Prescriptions  Pending Prescriptions Disp Refills   atenolol (TENORMIN) 25 MG tablet 90 tablet 2    Sig: TAKE 1 TABLET(25 MG) BY MOUTH DAILY     Cardiovascular: Beta Blockers 2 Passed - 05/29/2023 10:29 AM      Passed - Cr in normal range and within 360 days    Creatinine  Date Value Ref Range Status  09/15/2018 0.92 0.44 - 1.00 mg/dL Final   Creat  Date Value Ref Range Status  11/13/2016 0.98 0.50 - 0.99 mg/dL Final    Comment:      For patients > or = 69 years of age: The upper reference limit for Creatinine is approximately 13% higher for people identified as African-American.      Creatinine, Ser  Date Value Ref Range Status  12/01/2022 0.81 0.57 - 1.00 mg/dL Final         Passed - Last BP in normal range    BP Readings from Last 1 Encounters:  12/01/22 122/81         Passed - Last Heart Rate in normal range    Pulse Readings from Last 1 Encounters:  12/01/22 77         Passed - Valid encounter within last 6 months    Recent Outpatient Visits           1 month ago Essential hypertension   [place] [facility] Marcine Matar, MD   5 months ago Essential hypertension   [place] [facility] Marcine Matar, MD   10 months ago Essential hypertension   [place] [facility] Marcine Matar, MD   1 year ago Essential hypertension   [place] [facility] Marcine Matar, MD   1 year ago Essential hypertension   [place] [facility] Marcine Matar, MD       Future Appointments             In 2 months Laural Benes, Binnie Rail, MD [facility]

## 2023-05-29 NOTE — Telephone Encounter (Signed)
Medication Refill - Medication: atenolol (TENORMIN) 25 MG tablet   Has the patient contacted their pharmacy? No.  Preferred Pharmacy (with phone number or street name):  [id number] [place], [place] - [address] AT [facility] OF [facility] & [address] Phone: [phone]  Fax: [phone]     Has the patient been seen for an appointment in the last year OR does the patient have an upcoming appointment? Yes.    Agent: Please be advised that RX refills may take up to 3 business days. We ask that you follow-up with your pharmacy.  Patient stated she only has 1 pill left for tomorrow

## 2023-06-03 ENCOUNTER — Inpatient Hospital Stay: Admission: RE | Admit: 2023-06-03 | Payer: Medicare Other | Source: Ambulatory Visit

## 2023-06-03 DIAGNOSIS — M818 Other osteoporosis without current pathological fracture: Secondary | ICD-10-CM

## 2023-06-06 ENCOUNTER — Telehealth: Payer: Self-pay | Admitting: Internal Medicine

## 2023-06-06 DIAGNOSIS — M818 Other osteoporosis without current pathological fracture: Secondary | ICD-10-CM

## 2023-06-06 MED ORDER — ALENDRONATE SODIUM 70 MG PO TABS
ORAL_TABLET | ORAL | 5 refills | Status: DC
Start: 2023-06-06 — End: 2023-11-19

## 2023-06-06 NOTE — Telephone Encounter (Signed)
Phone call placed to patient today to go over the results of her bone density study.  I informed the patient that the bone density study reveals that she still has significant osteoporosis.  I had referred her to the endocrinologist earlier this year.  Patient states she still has not received an appointment as yet.  I will have our referral coordinator check in on this.  In the meantime I told that we should move forward with starting her on treatment to help strengthen her bones and prevent fractures. Creatinine, vitamin D level and calcium levels were normal 6 months ago.  I would like for them to be rechecked given that we will start her on a bisphosphonate.  Advised that she comes to the lab this week to have these blood test done. Inquired whether she had purchased the Os-Cal plus vitamin D as instructed on last visit.  She tells me she has not.  She continues to take a multivitamin that has vitamin D in it.  She was unable to tell me whether the multivitamin has calcium in it. Advised that we will start medication called Fosamax 70 mg that she will take once a week. Advised to take the pill first thing in the morning before she eats or takes any of her other medications.  Take with a large glass of water and sit upright for 30 minutes. Let me know if she develops any symptoms of heartburn while on this medicine.  She expressed understanding.  Message sent to my CMA to call her next week to remind about lab appointment.

## 2023-06-08 NOTE — Telephone Encounter (Signed)
Per patient request, scheduled apt for labs Thurs. At 1400.

## 2023-06-10 ENCOUNTER — Other Ambulatory Visit: Payer: Self-pay | Admitting: Internal Medicine

## 2023-06-10 DIAGNOSIS — F320 Major depressive disorder, single episode, mild: Secondary | ICD-10-CM

## 2023-06-11 ENCOUNTER — Other Ambulatory Visit: Payer: Self-pay

## 2023-06-11 ENCOUNTER — Ambulatory Visit: Payer: Medicare Other | Attending: Internal Medicine

## 2023-06-11 DIAGNOSIS — M818 Other osteoporosis without current pathological fracture: Secondary | ICD-10-CM

## 2023-06-12 LAB — BASIC METABOLIC PANEL
BUN/Creatinine Ratio: 18 (ref 12–28)
BUN: 13 mg/dL (ref 8–27)
CO2: 20 mmol/L (ref 20–29)
Calcium: 9.6 mg/dL (ref 8.7–10.3)
Chloride: 108 mmol/L — ABNORMAL HIGH (ref 96–106)
Creatinine, Ser: 0.73 mg/dL (ref 0.57–1.00)
Glucose: 93 mg/dL (ref 70–99)
Potassium: 3.9 mmol/L (ref 3.5–5.2)
Sodium: 140 mmol/L (ref 134–144)
eGFR: 90 mL/min/{1.73_m2} (ref >59)

## 2023-06-12 LAB — VITAMIN D 25 HYDROXY (VIT D DEFICIENCY, FRACTURES): Vit D, 25-Hydroxy: 35.8 ng/mL (ref 30.0–100.0)

## 2023-06-26 ENCOUNTER — Ambulatory Visit: Payer: Self-pay | Admitting: *Deleted

## 2023-06-26 NOTE — Telephone Encounter (Signed)
  Chief Complaint: took medications last night instead of normal time this am. Amlodipine 10 mg and buspar 15 mg Concerned and requesting advise Symptoms: none  Frequency: last night  Pertinent Negatives: Patient denies symptoms Disposition: [] ED /[] Urgent Care (no appt availability in office) / [] Appointment(In office/virtual)/ []  [facility]/ [x] Home Care/ [] Refused Recommended Disposition /[] [facility]/ []  Follow-up with PCP Additional Notes:   Reviewed with patient she is ok to resume medications to take in am,tomorrow as normal. If sx of dizziness, headache occur check BP and call back if needed. Can drink a little extra fluid today for hydration Patient verbalized understanding. Recommended to use pill calendar or use daily pill dispenser if needed.    Summary: med concern   Pt took her day medication last night. amLODipine (NORVASC) 10 MG tablet busPIRone (BUSPAR) 15 MG tablet She is concerned and worried, so I told her I would put high priority on my message.  Pt is asymptomatic.             Reason for Disposition  Caller has medicine question only, adult not sick, AND triager answers question  Answer Assessment - Initial Assessment Questions 1. NAME of MEDICINE: "What medicine(s) are you calling about?"     Amlodipine 10 mg and buspar 15 mg  2. QUESTION: "What is your question?" (e.g., double dose of medicine, side effect)     Information only to reports she took medications listed last night by mistake instead of this am as normal. Asymptomatic at this time  3. PRESCRIBER: "Who prescribed the medicine?" Reason: if prescribed by specialist, call should be referred to that group.     PCP 4. SYMPTOMS: "Do you have any symptoms?" If Yes, ask: "What symptoms are you having?"  "How bad are the symptoms (e.g., mild, moderate, severe)     None  5. PREGNANCY:  "Is there any chance that you are pregnant?" "When was your last menstrual period?"      na  Protocols used: Medication Question Call-A-AH

## 2023-08-11 ENCOUNTER — Ambulatory Visit: Payer: Medicare Other | Attending: Internal Medicine | Admitting: Internal Medicine

## 2023-08-11 ENCOUNTER — Encounter: Payer: Self-pay | Admitting: Internal Medicine

## 2023-08-11 VITALS — BP 126/76 | HR 72 | Temp 97.8°F | Ht 63.0 in | Wt 154.0 lb

## 2023-08-11 DIAGNOSIS — M79604 Pain in right leg: Secondary | ICD-10-CM | POA: Diagnosis not present

## 2023-08-11 DIAGNOSIS — R7303 Prediabetes: Secondary | ICD-10-CM | POA: Diagnosis not present

## 2023-08-11 DIAGNOSIS — M818 Other osteoporosis without current pathological fracture: Secondary | ICD-10-CM | POA: Insufficient documentation

## 2023-08-11 DIAGNOSIS — F419 Anxiety disorder, unspecified: Secondary | ICD-10-CM | POA: Insufficient documentation

## 2023-08-11 DIAGNOSIS — M25541 Pain in joints of right hand: Secondary | ICD-10-CM | POA: Diagnosis not present

## 2023-08-11 DIAGNOSIS — M79605 Pain in left leg: Secondary | ICD-10-CM | POA: Diagnosis not present

## 2023-08-11 DIAGNOSIS — R21 Rash and other nonspecific skin eruption: Secondary | ICD-10-CM | POA: Insufficient documentation

## 2023-08-11 DIAGNOSIS — Z853 Personal history of malignant neoplasm of breast: Secondary | ICD-10-CM | POA: Diagnosis not present

## 2023-08-11 DIAGNOSIS — L309 Dermatitis, unspecified: Secondary | ICD-10-CM

## 2023-08-11 DIAGNOSIS — G249 Dystonia, unspecified: Secondary | ICD-10-CM | POA: Insufficient documentation

## 2023-08-11 DIAGNOSIS — G252 Other specified forms of tremor: Secondary | ICD-10-CM | POA: Diagnosis not present

## 2023-08-11 DIAGNOSIS — M25542 Pain in joints of left hand: Secondary | ICD-10-CM | POA: Insufficient documentation

## 2023-08-11 DIAGNOSIS — I10 Essential (primary) hypertension: Secondary | ICD-10-CM | POA: Insufficient documentation

## 2023-08-11 DIAGNOSIS — L308 Other specified dermatitis: Secondary | ICD-10-CM | POA: Insufficient documentation

## 2023-08-11 DIAGNOSIS — M2550 Pain in unspecified joint: Secondary | ICD-10-CM

## 2023-08-11 DIAGNOSIS — Z923 Personal history of irradiation: Secondary | ICD-10-CM | POA: Insufficient documentation

## 2023-08-11 DIAGNOSIS — Z7983 Long term (current) use of bisphosphonates: Secondary | ICD-10-CM | POA: Diagnosis not present

## 2023-08-11 NOTE — Progress Notes (Signed)
Patient ID: Kimberly [name], female    DOB: 03-22-1956  MRN: [id number]  CC: Hypertension (HTN f/u. Addison Bailey on L buttocks X1 week/Already received flu vax. Discuss shingles vax.)   Subjective: Kimberly [name] is a 67 y.o. female who presents for chronic ds management. 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   HTN: taking Norvasc and Atenolol. Limits salt in foods. Has device but not checking BP. No CP/SOB  Tremors/dystonia:  she does not recall the last time she saw the neurologist at [facility].  Looking at Care Everywhere, she was last seen 01/2023.  Thinks she is still taking the Artane.  Would like to get back in with neurologist here locally.  Use to see Dr. Frances Furbish.  Osteoporosis:  taking and tolerating Fosamax.  Does not remember where she was called or not for appt with endocrinologist.  C/o itchy rash on LT buttock that started a wk ago.  It was "like veins popping out."  Did not hurt or burn. Felt like it scabbed.  Continues to report soreness in the joints sometimes in the hands and other times in the legs.  She has not noticed any swelling in the joints.  She states that the soreness does not last very long and certainly not all day.  She is concerned that she may have rheumatic fever.  States that she had it 1 time when she was young and her joints felt the same way.  She has not had any sore throat, no fever,  HM: reports she had flu shot 1-2 wks ago at church.  Declines shingle vaccine.  Plans to get COVID booster.   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 Take with large glass water, sit upright for 30 mins after taking. 4 tablet 5   amLODipine (NORVASC) 10 MG tablet TAKE 1 TABLET(10 MG) BY MOUTH DAILY 90 tablet 1   atenolol (TENORMIN) 25 MG tablet TAKE 1 TABLET(25 MG) BY MOUTH DAILY 90 tablet 1   Blood Pressure Monitor DEVI Use as directed to check home blood pressure 2-3 times a week 1 Device 0   busPIRone (BUSPAR) 15 MG tablet TAKE 1 TABLET(15 MG) BY MOUTH TWICE DAILY 180 tablet 1   fluticasone (FLONASE) 50 MCG/ACT nasal spray Place 1 spray into both nostrils daily as  needed for allergies or rhinitis. 16 g 6   loratadine (CLARITIN) 10 MG tablet Take 1 tablet (10 mg total) by mouth daily as needed for allergies. 60 tablet 1   Multiple Vitamin (MULTIVITAMIN WITH MINERALS) TABS tablet Take 1 tablet by mouth daily.      tamoxifen (NOLVADEX) 20 MG tablet TAKE 1 TABLET(20 MG) BY MOUTH DAILY 90 tablet 3   traZODone (DESYREL) 50 MG tablet TAKE 1 AND 1/2 TABLETS BY MOUTH EVERY NIGHT AT BEDTIME 135 tablet 0   trihexyphenidyl (ARTANE) 2 MG tablet Take by mouth.     No current facility-administered medications on file prior to visit.    Allergies  Allergen Reactions   Codeine Other (See Comments)    Cold sweats    Social History    Socioeconomic History   Marital status: Single    Spouse name: Not on file   Number of children: 2   Years of education: Not on file   Highest education level: 12th grade  Occupational History   Not on file  Tobacco Use   Smoking status: Never   Smokeless tobacco: Never  Vaping Use   Vaping status: Never Used  Substance and Sexual Activity   Alcohol use: No    Alcohol/week: 0.0 standard drinks of alcohol   Drug use: No   Sexual activity: Not Currently    Birth control/protection: Post-menopausal  Other Topics Concern   Not on file  Social History Narrative   Lives with daughter, 5 grandchildren   Social Determinants of Health   Financial Resource Strain: Low Risk  (04/08/2023)   Overall Financial Resource Strain (CARDIA)    Difficulty of Paying Living Expenses: Not hard at all  Food Insecurity: No Food Insecurity (04/08/2023)   Hunger Vital Sign    Worried About Running Out of Food in the Last Year: Never true    Ran Out of Food in the Last Year: Never true  Transportation Needs: No Transportation Needs (04/08/2023)   PRAPARE - Administrator, Civil Service (Medical): No    Lack of Transportation (Non-Medical): No  Physical Activity: Inactive (04/08/2023)   Exercise Vital Sign    Days of Exercise per Week: 0 days    Minutes of Exercise per Session: 0 min  Stress: No Stress Concern Present (04/08/2023)   [employer] of Occupational Health - Occupational Stress Questionnaire    Feeling of Stress : Not at all  Social Connections: Moderately Integrated (04/08/2023)   Social Connection and Isolation Panel [NHANES]    Frequency of Communication with Friends and Family: Twice a week    Frequency of Social Gatherings with Friends and Family: Three times a week    Attends Religious Services: More than 4 times per year    Active Member of Clubs or Organizations: Yes    Attends Banker Meetings: 1 to 4 times per year    Marital Status: Never married   Intimate Partner Violence: Not At Risk (04/08/2023)   Humiliation, Afraid, Rape, and Kick questionnaire    Fear of Current or Ex-Partner: No    Emotionally Abused: No    Physically Abused: No    Sexually Abused: No    Family History  Problem Relation Age of Onset   Breast cancer Mother 47   Cancer Maternal Grandmother        thinks it was braest, but not sure what type of cancer, dx >50   Heart disease Paternal Grandfather    Lung cancer  Maternal Aunt    Colon cancer Neg Hx     Past Surgical History:  Procedure Laterality Date   APPENDECTOMY     bowel obstruction     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   BREAST LUMPECTOMY WITH RADIOACTIVE SEED LOCALIZATION Left 10/14/2018   Procedure: BREAST LUMPECTOMY WITH RADIOACTIVE SEED LOCALIZATION X2;  Surgeon: Harriette Bouillon, MD;  Location: [facility] OR;  Service: General;  Laterality: Left;   CHOLECYSTECTOMY     COLONOSCOPY WITH PROPOFOL N/A 08/05/2018   Procedure: COLONOSCOPY WITH PROPOFOL;  Surgeon: Corbin Ade, MD;  Location: [facility]ITE;  Service: Endoscopy;  Laterality: N/A;  12:45pm   TONSILLECTOMY      ROS: Review of Systems Negative except as stated above  PHYSICAL EXAM: BP 126/76   Pulse 72   Temp 97.8 F (36.6 C) (Oral)   Ht 5\' 3"  (1.6 m)   Wt 154 lb (69.9 kg)   SpO2 98%   BMI 27.28 kg/m   Wt Readings from Last 3 Encounters:  08/11/23 154 lb (69.9 kg)  04/08/23 158 lb (71.7 kg)  12/01/22 158 lb 12.8 oz (72 kg)    Physical Exam  General appearance -pleasant elderly female in NAD. Mental status -patient is oriented to person place and time.  However she has significant difficulty in recalling things like the names of her medications and whether she received a call regarding follow-up neurology appointment or an endocrine appointment. Mouth: No oral lesions.  Throat is without erythema or exudates. Neck - supple, no significant adenopathy Chest - clear to auscultation, no wheezes, rales or rhonchi,  symmetric air entry Heart - normal rate, regular rhythm, normal S1, S2, no murmurs, rubs, clicks or gallops Neurological -patient with persistent movement especially of the head. Extremities -no signs of active inflammation of the joints of the hands, wrists.  Good range of motion of the knees.  She ambulates without assistive device.  She is able to get on and off the exam table without assistance. Skin: She has a reddish healed scar that appears to be in a dermatomal pattern on the upper outer aspect of the left buttock.  It is about 4 cm in size.    08/11/2023   11:41 AM 04/08/2023    1:13 PM 12/01/2022   11:49 AM  Depression screen PHQ 2/9  Decreased Interest 1 2 2   Down, Depressed, Hopeless 2 3 3   PHQ - 2 Score 3 5 5   Altered sleeping 0 2 3  Tired, decreased energy 0 0 0  Change in appetite 0 0 0  Feeling bad or failure about yourself  2 0 1  Trouble concentrating 0 0 0  Moving slowly or fidgety/restless 0 0 0  Suicidal thoughts 0 0 0  PHQ-9 Score 5 7 9   Difficult doing work/chores Not difficult at all         Latest Ref Rng & Units 06/11/2023    3:03 PM 12/01/2022   12:25 PM 11/25/2021    2:22 PM  CMP  Glucose 70 - 99 mg/dL 93  90  81   BUN 8 - 27 mg/dL 13  10  13    Creatinine 0.57 - 1.00 mg/dL 1.61  0.96  0.45   Sodium 134 - 144 mmol/L 140  141  140   Potassium 3.5 - 5.2 mmol/L 3.9  4.7  5.0   Chloride 96 - 106 mmol/L 108  105  104   CO2 20 - 29 mmol/L 20  20  22   Calcium 8.7 - 10.3 mg/dL 9.6  9.7  9.8   Total Protein 6.0 - 8.5 g/dL  7.4  7.7   Total Bilirubin 0.0 - 1.2 mg/dL  <1.6  <1.0   Alkaline Phos 44 - 121 IU/L  68  67   AST 0 - 40 IU/L  21  19   ALT 0 - 32 IU/L  13  12    Lipid Panel     Component Value Date/Time   CHOL 184 12/01/2022 1225   TRIG 122 12/01/2022 1225   HDL 66 12/01/2022 1225   CHOLHDL 2.8 12/01/2022 1225   CHOLHDL 2.7 11/13/2016 1102   VLDL 25 11/13/2016 1102   LDLCALC 97 12/01/2022 1225    CBC    Component Value Date/Time   WBC  7.4 12/01/2022 1225   WBC 6.1 10/08/2018 0919   RBC 4.74 12/01/2022 1225   RBC 4.84 10/08/2018 0919   HGB 14.3 12/01/2022 1225   HCT 43.1 12/01/2022 1225   PLT 428 12/01/2022 1225   MCV 91 12/01/2022 1225   MCH 30.2 12/01/2022 1225   MCH 30.4 10/08/2018 0919   MCHC 33.2 12/01/2022 1225   MCHC 32.7 10/08/2018 0919   RDW 13.0 12/01/2022 1225   LYMPHSABS 1.9 10/08/2018 0919   MONOABS 0.6 10/08/2018 0919   EOSABS 0.2 10/08/2018 0919   BASOSABS 0.1 10/08/2018 0919    ASSESSMENT AND PLAN: 1. Essential hypertension At goal.  Continue amlodipine 10 mg daily and atenolol 25 mg daily.  2. Other osteoporosis without current pathological fracture Continue once weekly Fosamax for now.  She is on tamoxifen posttreatment for cancer in the left breast I will try to get her in with [facility] endocrinology. - Ambulatory referral to Endocrinology  3. Coarse tremors She was followed by [facility] neurology and was last seen in April of this year.  She was supposed to follow-up in 6 months but is not sure that she has an appointment coming up.  If possible she would like to be followed here locally.  She is to see Dr. Frances Furbish with [facility] neurology.  I have submitted that referral. She is on Artane - Ambulatory referral to Neurology  4. Dystonia See #3 above - Ambulatory referral to Neurology  5. Dermatitis This looks like healed shingles.  Discussed with her about having shingles vaccine.  Patient declines shingles vaccine.  6.  Polyarthralgia I do not think she has rheumatic fever.  Advised to use Tylenol as needed.  Patient was given the opportunity to ask questions.  Patient verbalized understanding of the plan and was able to repeat key elements of the plan.   This documentation was completed using Paediatric nurse.  Any transcriptional errors are unintentional.  Orders Placed This Encounter  Procedures   Ambulatory referral to Endocrinology   Ambulatory  referral to Neurology     Requested Prescriptions    No prescriptions requested or ordered in this encounter    Return in about 4 months (around 12/11/2023) for chronic ds management.  Jonah Blue, MD, FACP

## 2023-08-22 ENCOUNTER — Other Ambulatory Visit: Payer: Self-pay | Admitting: Internal Medicine

## 2023-08-22 DIAGNOSIS — F320 Major depressive disorder, single episode, mild: Secondary | ICD-10-CM

## 2023-08-24 NOTE — Telephone Encounter (Signed)
Requested Prescriptions  Pending Prescriptions Disp Refills   traZODone (DESYREL) 50 MG tablet [Pharmacy Med Name: TRAZODONE 50MG  TABLETS] 135 tablet 0    Sig: TAKE 1 AND 1/2 TABLETS BY MOUTH EVERY NIGHT AT BEDTIME     Psychiatry: Antidepressants - Serotonin Modulator Passed - 08/22/2023  6:56 AM      Passed - Completed PHQ-2 or PHQ-9 in the last 360 days      Passed - Valid encounter within last 6 months    Recent Outpatient Visits           1 week ago Essential hypertension   [place] [facility] [place]. [facility] Marcine Matar, MD   4 months ago Essential hypertension   [place] [facility] [place] [facility] Of [place]. [facility] Marcine Matar, MD   8 months ago Essential hypertension   [place] [facility] [place] [facility] Of [place]. [facility] Marcine Matar, MD   1 year ago Essential hypertension   [place] [facility] [place]. [facility] Marcine Matar, MD   1 year ago Essential hypertension   [place] [facility] [place] [facility] Dept Of [place]. [facility] Marcine Matar, MD       Future Appointments             In 3 months Laural Benes Binnie Rail, MD [facility] - A Dept Of [place] Bridegroom. [facility]

## 2023-08-29 ENCOUNTER — Other Ambulatory Visit: Payer: Self-pay | Admitting: Hematology and Oncology

## 2023-08-29 ENCOUNTER — Other Ambulatory Visit: Payer: Self-pay | Admitting: Internal Medicine

## 2023-08-29 DIAGNOSIS — I10 Essential (primary) hypertension: Secondary | ICD-10-CM

## 2023-08-31 NOTE — Telephone Encounter (Signed)
Reordered 04/10/23 #90 1 RF  Requested Prescriptions  Refused Prescriptions Disp Refills   amLODipine (NORVASC) 10 MG tablet [Pharmacy Med Name: AMLODIPINE BESYLATE 10MG  TABLETS] 30 tablet     Sig: TAKE 1 TABLET(10 MG) BY MOUTH DAILY     Cardiovascular: Calcium Channel Blockers 2 Passed - 08/29/2023  9:11 AM      Passed - Last BP in normal range    BP Readings from Last 1 Encounters:  08/11/23 126/76         Passed - Last Heart Rate in normal range    Pulse Readings from Last 1 Encounters:  08/11/23 72         Passed - Valid encounter within last 6 months    Recent Outpatient Visits           2 weeks ago Essential hypertension   [place] [facility] [place] [facility] [place]. [facility] Marcine Matar, MD   4 months ago Essential hypertension   [place] [facility] [place] [facility] Of [place]. [facility] Marcine Matar, MD   9 months ago Essential hypertension   [place] [facility] [place] [facility] Dept Of [place]. [facility] Marcine Matar, MD   1 year ago Essential hypertension   [place] [facility] [place] [facility] Dept Of [place]. [facility] Marcine Matar, MD   1 year ago Essential hypertension   [place] [facility] [place] - [facility] Dept Of [place]. [facility] Marcine Matar, MD       Future Appointments             In 3 months Laural Benes Binnie Rail, MD [facility] [place] - A Dept Of [place] [facility]. [facility]

## 2023-09-03 ENCOUNTER — Encounter: Payer: Self-pay | Admitting: Internal Medicine

## 2023-09-03 ENCOUNTER — Ambulatory Visit: Payer: Medicare Other | Attending: Internal Medicine | Admitting: Internal Medicine

## 2023-09-03 VITALS — BP 107/71 | HR 87 | Resp 16 | Ht 62.5 in | Wt 155.0 lb

## 2023-09-03 DIAGNOSIS — M79641 Pain in right hand: Secondary | ICD-10-CM | POA: Diagnosis present

## 2023-09-03 DIAGNOSIS — B353 Tinea pedis: Secondary | ICD-10-CM

## 2023-09-03 DIAGNOSIS — B351 Tinea unguium: Secondary | ICD-10-CM

## 2023-09-03 DIAGNOSIS — M79642 Pain in left hand: Secondary | ICD-10-CM | POA: Diagnosis present

## 2023-09-03 NOTE — Progress Notes (Signed)
Office Visit Note  Patient: Kimberly [name]             Date of Birth: 02-Jan-1956           MRN: [id number]             PCP: Marcine Matar, MD Referring: Marcine Matar, MD Visit Date: 09/03/2023  Subjective:  New Patient (Initial Visit) (Patient states she has joint pain in her hands. )   Discussed the use of AI scribe software for clinical note transcription with the patient, who gave verbal consent to proceed.  History of Present Illness   Kimberly [name] is a 67 y.o. female here for evaluation of joint pain in multiple areas. She has a history of left breast DCIS treated with a lumpectomy x2 and radiation 2020, and is currently on tamoxifen. She has trouble with intermittent joint pain throughout the left arm and in both hands. The patient also reports nodule on the back of their hands, which are not painful but does not recall since when these started.  Denies any hand swelling and currently does not have pain.  They do not take any over-the-counter or prescription medications for joint pain. They report a history of rheumatic fever in childhood but deny any related heart disease.  They also report a history of nerve problems, which have been worsening over time, leading to difficulties with writing due to tremors. They also report difficulty with mobility, particularly getting up and down from a seated position and navigating stairs.  She sees neurology for treatment for spasmodic torticollis with essential tremor on treatment with atenolol and Artane.  Per review documents progressive tremor symptoms have been worse since 2017-2018.  They have some skin peeling on the bottom of their feet, which they manage with shea butter. They also have thickened toenails, whic they previous used to have trimmed by podiatry but not recently.   11/2022 RF neg CCP neg  Activities of Daily Living:  Patient reports morning stiffness for 0 minute.   Patient Denies nocturnal pain.   Difficulty dressing/grooming: Denies Difficulty climbing stairs: Denies Difficulty getting out of chair: Reports Difficulty using hands for taps, buttons, cutlery, and/or writing: Reports  Review of Systems  Constitutional:  Negative for fatigue.  HENT:  Negative for mouth sores and mouth dryness.   Eyes:  Negative for dryness.  Respiratory:  Negative for shortness of breath.   Cardiovascular:  Negative for chest pain and palpitations.  Gastrointestinal:  Negative for blood in stool, constipation and diarrhea.  Endocrine: Negative for increased urination.  Genitourinary:  Negative for involuntary urination.  Musculoskeletal:  Positive for joint pain, joint pain and muscle weakness. Negative for gait problem, joint swelling, myalgias, morning stiffness, muscle tenderness and myalgias.  Skin:  Negative for color change, rash, hair loss and sensitivity to sunlight.  Allergic/Immunologic: Negative for susceptible to infections.  Neurological:  Negative for dizziness and headaches.  Hematological:  Negative for swollen glands.  Psychiatric/Behavioral:  Positive for depressed mood and sleep disturbance. The patient is nervous/anxious.     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   Intention tremor 10/15/2017   Diabetes mellitus screening 06/10/2017   Breast nodule 03/02/2016   Seasonal allergies 01/07/2016   Right shoulder pain 09/28/2015   Chronic insomnia 07/06/2015   Tinea pedis  07/06/2015   Nocturia 08/25/2014   Chronic anxiety 08/25/2014   Heme positive stool 08/16/2014   HTN (hypertension) 08/16/2014    Past Medical History:  Diagnosis Date   Anxiety 1995   Breast cancer (HCC) 2020   Left Breast Cancer   Cancer (HCC)    breast left   Depression 1995   Family history of breast cancer    Family history of lung cancer    History of kidney stones    History of rheumatic fever as a child    Hypertension Dx Dec 2015   Panic attack 1995   Personal history of radiation therapy 2020   Left Breast Cancer   Tremor     Family History  Problem Relation Age of Onset   Breast cancer Mother 54   Cancer Maternal Grandmother        thinks it was braest, but not sure what type of cancer, dx >50   Heart disease Paternal Grandfather    Lung cancer Maternal Aunt    Colon cancer Neg Hx    Past Surgical History:  Procedure Laterality Date   APPENDECTOMY     bowel obstruction     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   BREAST LUMPECTOMY WITH RADIOACTIVE SEED LOCALIZATION Left 10/14/2018   Procedure: BREAST LUMPECTOMY WITH RADIOACTIVE SEED LOCALIZATION X2;  Surgeon: Harriette Bouillon, MD;  Location: [facility] OR;  Service: General;  Laterality: Left;   CHOLECYSTECTOMY     COLONOSCOPY WITH PROPOFOL N/A 08/05/2018   Procedure: COLONOSCOPY WITH PROPOFOL;  Surgeon: Corbin Ade, MD;  Location: [facility];  Service: Endoscopy;  Laterality: N/A;  12:45pm   TONSILLECTOMY     Social History   Social History Narrative   Lives with daughter, 5 grandchildren   Immunization History  Administered Date(s) Administered   Fluad Quad(high Dose 65+) 07/18/2022   Influenza, High Dose Seasonal PF 07/28/2023   Influenza,inj,Quad PF,6+ Mos 09/21/2014, 07/07/2016, 06/11/2017, 06/25/2021   Influenza-Unspecified 06/28/2019, 07/03/2020   Moderna SARS-COV2 Booster Vaccination 10/25/2020   Moderna Sars-Covid-2 Vaccination 11/28/2019, 12/27/2019   Pneumococcal Conjugate-13 11/22/2020    Pneumococcal Polysaccharide-23 09/22/2014, 03/27/2022   Tdap 08/11/2016     Objective: Vital Signs: BP 107/71 (BP Location: Right Arm, Patient Position: Sitting, Cuff Size: Normal) Comment (BP Location): Patient had a breast surgery on the left side.  Pulse 87   Resp 16   Ht 5' 2.5" (1.588 m)   Wt 155 lb (70.3 kg)   BMI 27.90 kg/m    Physical Exam Eyes:     Conjunctiva/sclera: Conjunctivae normal.  Cardiovascular:     Rate and Rhythm: Normal rate and regular rhythm.  Pulmonary:     Effort: Pulmonary effort is normal.     Breath sounds: Normal breath sounds.  Lymphadenopathy:     Cervical: No cervical adenopathy.  Skin:    General: Skin is warm and dry.     Findings: No rash.  Neurological:     Mental Status: She is alert.     Comments: Constant positional tremor throughout head and both sides  Psychiatric:        Mood and  Affect: Mood normal.      Musculoskeletal Exam:  Shoulders full ROM no tenderness or swelling Elbows full ROM no tenderness or swelling Wrists full ROM no tenderness or swelling Mobile soft painless nodule on dorsum of right hand on 4th digit extensor tendon, soft tissue selling also present on dorsum of left wrist without tenderness Fingers full ROM no tenderness or swelling Knees full ROM no tenderness or swelling     Investigation: No additional findings.  Imaging: No results found.  Recent Labs: Lab Results  Component Value Date   WBC 7.4 12/01/2022   HGB 14.3 12/01/2022   PLT 428 12/01/2022   NA 140 06/11/2023   K 3.9 06/11/2023   CL 108 (H) 06/11/2023   CO2 20 06/11/2023   GLUCOSE 93 06/11/2023   BUN 13 06/11/2023   CREATININE 0.73 06/11/2023   BILITOT <0.2 12/01/2022   ALKPHOS 68 12/01/2022   AST 21 12/01/2022   ALT 13 12/01/2022   PROT 7.4 12/01/2022   ALBUMIN 4.4 12/01/2022   CALCIUM 9.6 06/11/2023   GFRAA 90 11/22/2020    Speciality Comments: No specialty comments available.  Procedures:  No procedures  performed Allergies: Codeine   Assessment / Plan:     Visit Diagnoses: Bilateral hand pain Joint pain of multiple areas, intermittent  Patient referred for polyarthralgias today mostly complains of intermittent hand pain and left arm pain but is not bothering her today.  Physical exam reveals evidence of mild osteoarthritis there are also mobile nodules on the extensor tendons of bilateral hands not sure about chronicity of these.  Previous lab screening in February was negative for RA serology and I do not see any definite synovitis to indicate seronegative disease.  Cystic changes of extensor tendons are currently painless and appear more longstanding with repetitive use versus evidence of tenosynovitis.  But with minimal symptoms I would recommend conservative treatment approach for now. -Over-the-counter pain relief such as ibuprofen or Tylenol as needed. -If persistent symptoms possibly tamoxifen related consider SNRI medication -Provided printed handout on supplements for joint health. -Return for evaluation if nodules increase in size or cause pain.  Tinea pedis of both feet Onychomycosis -Recommend seeing a podiatrist for toenail care and treatment. -Try over-the-counter antifungal medication such as Lotrimin/Lamisil.  Breast Cancer On Tamoxifen, due to complete next year.   Orders: No orders of the defined types were placed in this encounter.  No orders of the defined types were placed in this encounter.   Follow-Up Instructions: Return if symptoms worsen or fail to improve.   Fuller Plan, MD  Note - This record has been created using [employer].  Chart creation errors have been sought, but may not always  have been located. Such creation errors do not reflect on  the standard of medical care.

## 2023-09-03 NOTE — Patient Instructions (Addendum)
For osteoarthritis several treatments may be beneficial:  - Topical antiinflammatory medicine such as diclofenac or Voltaren can be applied to  affected area as needed. Topical analgesics containing CBD, menthol, or lidocaine can be tried.  - Oral nonsteroidal antiinflammatory drugs (NSAIDs) such as ibuprofen, aleve, or pain reducing medicine ike tylenol can be used as needed for painful joints.  - Turmeric has some antiinflammatory effect similar to NSAIDs and may help, if taken as a supplement should not be taken above recommended doses.   - Compressive gloves or sleeve can be helpful to support the joint especially if hurting or swelling with certain activities.  - Physical therapy referral can discuss exercises or activity modification to improve symptoms or strength if needed.  - Local steroid injection is an option if symptoms become worse and not controlled by the above options.    For skin rash this looks like "tinea pedis" which is a fungal infection of the foot. It can be treated with topical antifungal medicine like terbinafine (Lamisil). The nail changes also look consistent with fungal infection but these are often harder to eliminate. I recommend follow up with podiatry for this.

## 2023-09-14 ENCOUNTER — Inpatient Hospital Stay: Payer: Medicare Other | Attending: Hematology and Oncology | Admitting: Hematology and Oncology

## 2023-09-14 VITALS — BP 120/88 | HR 65 | Temp 98.1°F | Resp 18 | Ht 62.5 in | Wt 155.1 lb

## 2023-09-14 DIAGNOSIS — Z170 Estrogen receptor positive status [ER+]: Secondary | ICD-10-CM | POA: Diagnosis not present

## 2023-09-14 DIAGNOSIS — Z923 Personal history of irradiation: Secondary | ICD-10-CM | POA: Diagnosis not present

## 2023-09-14 DIAGNOSIS — D0512 Intraductal carcinoma in situ of left breast: Secondary | ICD-10-CM | POA: Insufficient documentation

## 2023-09-14 DIAGNOSIS — Z79810 Long term (current) use of selective estrogen receptor modulators (SERMs): Secondary | ICD-10-CM | POA: Diagnosis not present

## 2023-09-14 NOTE — Progress Notes (Signed)
Patient Care Team: Marcine Matar, MD as PCP - General (Internal Medicine) Jena Gauss, Gerrit Friends, MD as Consulting Physician (Gastroenterology) Harriette Bouillon, MD as Consulting Physician (General Surgery) Serena Croissant, MD as Consulting Physician (Hematology and Oncology) Dorothy Puffer, MD as Consulting Physician (Radiation Oncology) Leane Call, PA-C (Endocrinology) Helane Gunther, DPM as Consulting Physician (Podiatry)  DIAGNOSIS:  Encounter Diagnosis  Name Primary?   Ductal carcinoma in situ (DCIS) of left breast Yes    SUMMARY OF ONCOLOGIC HISTORY: Oncology History  Ductal carcinoma in situ (DCIS) of left breast  09/07/2018 Initial Diagnosis   Bilateral masses and calcifications, right breast benign, left breast UOQ 3.2 cm biopsy revealed intermediate grade to high-grade DCIS ER 100%, PR 100%, 4 o'clock position retroareolar calcifications 2.2 cm biopsy revealed intermediate grade DCIS with [facility] ER 100%, PR 90%, both lesions are 5.5 cm apart, Tis NX stage 0   10/14/2018 Surgery   Left breast lumpectomy x2: 2 foci of DCIS intermediate grade 1.6 cm and 1.1 cm, LCIS, final margins negative.  The smaller DCIS 0.1 cm from inferior margin, ER 100%, PR 90%, Tis NX stage 0   10/27/2018 Cancer Staging   Staging form: Breast, AJCC 8th Edition - Pathologic: Stage 0 (pTis (DCIS), pN0, cM0, ER+, PR+) - Signed by Loa Socks, NP on 10/27/2018   11/23/2018 - 12/17/2018 Radiation Therapy   Adjuvant radiation   01/2019 -  Anti-estrogen oral therapy   Tamoxifen daily     CHIEF COMPLIANT: Follow-up on tamoxifen therapy  HISTORY OF PRESENT ILLNESS:   History of Present Illness   The patient, with a history of breast cancer and osteoporosis, presents for a follow-up visit. She has been on tamoxifen for nearly five years and reports no side effects. She is due to finish her course of tamoxifen in a couple of months. She has been keeping up with her mammograms, with the next one due  in January.  For her osteoporosis, she is taking Fosamax once a week.         ALLERGIES:  is allergic to codeine.  MEDICATIONS:  Current Outpatient Medications  Medication Sig Dispense Refill   alendronate (FOSAMAX) 70 MG tablet 1 tab PO once a wk first thing in morning on empty stomach and at least 30 mins beofre taking other meds. Take with large glass water, sit upright for 30 mins after taking. 4 tablet 5   amLODipine (NORVASC) 10 MG tablet TAKE 1 TABLET(10 MG) BY MOUTH DAILY 90 tablet 1   atenolol (TENORMIN) 25 MG tablet TAKE 1 TABLET(25 MG) BY MOUTH DAILY 90 tablet 1   Blood Pressure Monitor DEVI Use as directed to check home blood pressure 2-3 times a week 1 Device 0   busPIRone (BUSPAR) 15 MG tablet TAKE 1 TABLET(15 MG) BY MOUTH TWICE DAILY 180 tablet 1   fluticasone (FLONASE) 50 MCG/ACT nasal spray Place 1 spray into both nostrils daily as needed for allergies or rhinitis. 16 g 6   Multiple Vitamin (MULTIVITAMIN WITH MINERALS) TABS tablet Take 1 tablet by mouth daily.      traZODone (DESYREL) 50 MG tablet TAKE 1 AND 1/2 TABLETS BY MOUTH EVERY NIGHT AT BEDTIME 135 tablet 0   trihexyphenidyl (ARTANE) 2 MG tablet Take by mouth.     No current facility-administered medications for this visit.    PHYSICAL EXAMINATION: ECOG PERFORMANCE STATUS: 1 - Symptomatic but completely ambulatory  Vitals:   09/14/23 1113  BP: 120/88  Pulse: 65  Resp: 18  Temp: 98.1 F (36.7 C)  SpO2: 100%   Filed Weights   09/14/23 1113  Weight: 155 lb 1.6 oz (70.4 kg)     LABORATORY DATA:  I have reviewed the data as listed    Latest Ref Rng & Units 06/11/2023    3:03 PM 12/01/2022   12:25 PM 11/25/2021    2:22 PM  CMP  Glucose 70 - 99 mg/dL 93  90  81   BUN 8 - 27 mg/dL 13  10  13    Creatinine 0.57 - 1.00 mg/dL 8.11  9.14  7.82   Sodium 134 - 144 mmol/L 140  141  140   Potassium 3.5 - 5.2 mmol/L 3.9  4.7  5.0   Chloride 96 - 106 mmol/L 108  105  104   CO2 20 - 29 mmol/L 20  20  22     Calcium 8.7 - 10.3 mg/dL 9.6  9.7  9.8   Total Protein 6.0 - 8.5 g/dL  7.4  7.7   Total Bilirubin 0.0 - 1.2 mg/dL  <9.5  <6.2   Alkaline Phos 44 - 121 IU/L  68  67   AST 0 - 40 IU/L  21  19   ALT 0 - 32 IU/L  13  12     Lab Results  Component Value Date   WBC 7.4 12/01/2022   HGB 14.3 12/01/2022   HCT 43.1 12/01/2022   MCV 91 12/01/2022   PLT 428 12/01/2022   NEUTROABS 3.3 10/08/2018    ASSESSMENT & PLAN:  Ductal carcinoma in situ (DCIS) of left breast 10/14/2018: Left breast lumpectomy x2: 2 foci of DCIS intermediate grade 1.6 cm and 1.1 cm, LCIS, final margins negative.  The smaller DCIS 0.1 cm from inferior margin, ER 100%, PR 90%, Tis NX stage 0   Treatment plan: 1.  Adjuvant radiation therapy 11/23/2018-12/14/2018 2.  Followed by adjuvant antiestrogen therapy with tamoxifen x5 years 11/2018-11/2023   Tamoxifen toxicities:  Denies any hot flashes or myalgias.   Breast cancer surveillance: 1.  Mammogram 10/22/2022: Benign breast density category B 2.  Bone density 06/03/2023: T-score -3.5: Severe osteoporosis: Calcium vitamin D and bisphosphonate therapy with Fosamax (December 2020: T-score -3.5) Instructed to make another appointment for mammogram in January.  Return to clinic on an as-needed basis.  No orders of the defined types were placed in this encounter.  The patient has a good understanding of the overall plan. she agrees with it. she will call with any problems that may develop before the next visit here. Total time spent: 30 mins including face to face time and time spent for planning, charting and co-ordination of care   Tamsen Meek, MD 09/14/23

## 2023-09-14 NOTE — Assessment & Plan Note (Addendum)
10/14/2018: Left breast lumpectomy x2: 2 foci of DCIS intermediate grade 1.6 cm and 1.1 cm, LCIS, final margins negative.  The smaller DCIS 0.1 cm from inferior margin, ER 100%, PR 90%, Tis NX stage 0   Treatment plan: 1.  Adjuvant radiation therapy 11/23/2018-12/14/2018 2.  Followed by adjuvant antiestrogen therapy with tamoxifen x5 years 11/2018-11/2023   Tamoxifen toxicities:  Denies any hot flashes or myalgias.   Breast cancer surveillance: 1.  Breast exam 09/14/2023: Benign 2.  Mammogram 10/22/2022: Benign breast density category B 3.  Bone density 06/03/2023: T-score -3.5: Severe osteoporosis: Calcium vitamin D and bisphosphonate therapy with Fosamax (December 2020: T-score -3.5)   I renewed her prescription for tamoxifen. Return to clinic in 1 year for follow-up

## 2023-09-23 ENCOUNTER — Other Ambulatory Visit: Payer: Self-pay | Admitting: Internal Medicine

## 2023-09-23 DIAGNOSIS — Z0000 Encounter for general adult medical examination without abnormal findings: Secondary | ICD-10-CM

## 2023-10-23 ENCOUNTER — Ambulatory Visit
Admission: RE | Admit: 2023-10-23 | Discharge: 2023-10-23 | Disposition: A | Discharge status: Home / Self Care | Payer: Medicare Other | Source: Ambulatory Visit | Attending: Internal Medicine | Admitting: Internal Medicine

## 2023-10-23 DIAGNOSIS — Z0000 Encounter for general adult medical examination without abnormal findings: Secondary | ICD-10-CM

## 2023-10-24 ENCOUNTER — Other Ambulatory Visit: Payer: Self-pay | Admitting: Internal Medicine

## 2023-10-24 DIAGNOSIS — I10 Essential (primary) hypertension: Secondary | ICD-10-CM

## 2023-11-19 ENCOUNTER — Other Ambulatory Visit: Payer: Self-pay | Admitting: Internal Medicine

## 2023-11-19 DIAGNOSIS — M818 Other osteoporosis without current pathological fracture: Secondary | ICD-10-CM

## 2023-11-20 ENCOUNTER — Other Ambulatory Visit: Payer: Self-pay | Admitting: Internal Medicine

## 2023-11-20 DIAGNOSIS — I10 Essential (primary) hypertension: Secondary | ICD-10-CM

## 2023-11-25 ENCOUNTER — Other Ambulatory Visit: Payer: Self-pay | Admitting: Internal Medicine

## 2023-11-25 DIAGNOSIS — F320 Major depressive disorder, single episode, mild: Secondary | ICD-10-CM

## 2023-11-30 ENCOUNTER — Encounter: Payer: Self-pay | Admitting: Endocrinology

## 2023-11-30 ENCOUNTER — Ambulatory Visit (INDEPENDENT_AMBULATORY_CARE_PROVIDER_SITE_OTHER): Payer: Medicare Other | Admitting: Endocrinology

## 2023-11-30 VITALS — BP 110/70 | HR 73 | Resp 20 | Ht 63.0 in | Wt 155.0 lb

## 2023-11-30 DIAGNOSIS — M810 Age-related osteoporosis without current pathological fracture: Secondary | ICD-10-CM | POA: Diagnosis not present

## 2023-11-30 NOTE — Progress Notes (Signed)
 Outpatient Endocrinology Note Kimberly Florean Hoobler, MD   Patient's Name: Kimberly [name]    DOB: November 10, 1955    MRN: [id number]  REASON OF VISIT: New consult for osteoporosis  REFERRING PROVIDER: Marcine Matar, MD  PCP: Marcine Matar, MD  HISTORY OF PRESENT ILLNESS:   Kimberly [name] is a 68 y.o. old female with past medical history listed below, is here for new consult of bone health issues / osteoporosis.  Pertinent Bone Health History: Patient was diagnosed with osteoporosis based on DEXA scan in December 2022, repeat DEXA scan in June 03, 2023 consistent with osteoporosis with lowest T-score of -3.5 at lumbar spine L1-L4, T-score of -2.9 at dual femoral total right and -2.8 at dual femoral total mean.  Patient is referred to endocrinology for evaluation and management of osteoporosis.  Patient was on tamoxifen started in February 2020 after a diagnosis of breast cancer treated with left breast lumpectomy and adjuvant radiation therapy.  Patient was started on Fosamax 70 mg weekly in August 2024 after repeat DEXA scan consistent with no improvement on osteoporosis.   Bone Health Concerns:  Bisphosphonate use / other previous treatment-Tamoxifen February 2020 and Fosamax from August 2024.  Patient has been taking calcium and vitamin D supplement.  She is non-smoker.  No excessive alcohol intake.  No chronic glucocorticoid use.  No anticonvulsant use.  No proton pump inhibitor use.  No fragility fracture.  No thyroid disorder/hypothyroidism or malabsorption or liver disease.  Fracture history: None.  Osteoporosis medications: Fosamax restarted in August 2024.  Secondary workup for osteoporosis: Negative so far.  Calcium intake from supplements: Calcium/vitamin D 1 tablet daily. Dietary calcium intake: occasional ice cream, cheese.  Current vitamin D intake: ? international units daily.  Relevant comorbidities: No history of bone cancer. H/o Breast cancer s/p  external  radiation treatment in 2020.   No history of diabetes mellitus.  No Rheumatoid arthritis. No history of GERD.  No history of recent invasive dental procedures. Not planning dental procedures in the near future.?  She may plan for dental procedure.  She has been periodically doing dental cleaning.  Imagings:  EXAM: Over 21, 2024 : Reviewed DUAL X-RAY ABSORPTIOMETRY (DXA) FOR BONE MINERAL DENSITY   IMPRESSION: Referring Physician:  Marcine Matar Your patient completed a bone mineral density test using GE Lunar iDXA system (analysis version: 16). Technologist:     lmn PATIENT: Name: Kimberly [name], [name] Patient ID: [id number] Birth Date: 1956/09/04 Height: 62.5 in. Sex: Female Measured: 06/03/2023 Weight: 152.0 lbs. Indications: Atenolol, Breast Cancer History, Desyrel, Estrogen Deficient, Postmenopausal, Tamoxifen, Trazodone Fractures: None Treatments: Multivitamin   ASSESSMENT: The BMD measured at AP Spine L1-L4 is 0.761 g/cm2 with a T-score of -3.5. This patient is considered osteoporotic according to World Health Organization [facility]) criteria.   The quality of the exam is good. Site Region Measured Date Measured Age YA BMD Significant CHANGE T-score AP Spine  L1-L4       4/03/2021    68.6         -3.5    0.761 g/cm2 AP Spine  L1-L4       09/14/2019    64.9         -3.5    0.765 g/cm2   DualFemur Total Right 06/03/2023 68.6 -2.9 0.645 g/cm2 * DualFemur Total Right 09/14/2019    64.9         -2.5    0.688 g/cm2   DualFemur Total Mean 06/03/2023 68.6 -2.8 0.656 g/cm2 *  DualFemur Total Mean  09/14/2019    64.9         -2.5    0.694 g/cm2   World Health Organization [facility]) criteria for post-menopausal, Caucasian Women: Normal       T-score at or above -1 SD Osteopenia   T-score between -1 and -2.5 SD Osteoporosis T-score at or below -2.5 SD   Interval history  Patient presented to for evaluation and management of osteoporosis.  She has been currently taking Fosamax 70 mg  weekly.  She has been taking calcium and vitamin D supplements.    REVIEW OF SYSTEMS:  As per history of present illness.   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   BREAST LUMPECTOMY WITH RADIOACTIVE SEED LOCALIZATION Left 10/14/2018   Procedure: BREAST LUMPECTOMY WITH RADIOACTIVE SEED LOCALIZATION X2;  Surgeon: Harriette Bouillon, MD;  Location: [facility] OR;  Service: General;  Laterality: Left;   CHOLECYSTECTOMY     COLONOSCOPY WITH PROPOFOL N/A 08/05/2018   Procedure: COLONOSCOPY WITH PROPOFOL;  Surgeon: Corbin Ade, MD;  Location: [facility];  Service: Endoscopy;  Laterality: N/A;  12:45pm   TONSILLECTOMY      ALLERGIES: Allergies  Allergen Reactions   Codeine Other (See Comments)    Cold sweats    FAMILY HISTORY:  Family History  Problem Relation Age of Onset   Breast cancer Mother 59   Cancer Maternal Grandmother        thinks it was braest, but not sure what type of cancer, dx >50   Heart disease Paternal Grandfather    Lung cancer Maternal Aunt    Colon cancer Neg Hx     SOCIAL HISTORY: Social History   Socioeconomic History   Marital status: Single    Spouse name: Not on file   Number of children: 2   Years of education: Not on file   Highest education level: 12th grade  Occupational History   Not on file  Tobacco Use   Smoking status: Never    Passive exposure: Never   Smokeless tobacco: Never   Vaping Use   Vaping status: Never Used  Substance and Sexual Activity   Alcohol use: No    Alcohol/week: 0.0 standard drinks of alcohol   Drug use: No   Sexual activity: Not Currently    Birth control/protection: Post-menopausal  Other Topics Concern   Not on file  Social History Narrative   Lives with daughter, 5 grandchildren   Social Drivers of Health   Financial Resource Strain: Low Risk  (04/08/2023)   Overall Financial Resource Strain (CARDIA)    Difficulty of Paying Living Expenses: Not hard at all  Food Insecurity: Low Risk  (11/09/2023)   Received from [facility]   Hunger Vital Sign    Worried  About Running Out of Food in the Last Year: Never true    Ran Out of Food in the Last Year: Never true  Transportation Needs: No Transportation Needs (04/08/2023)   PRAPARE - Administrator, Civil Service (Medical): No    Lack of Transportation (Non-Medical): No  Physical Activity: Inactive (04/08/2023)   Exercise Vital Sign    Days of Exercise per Week: 0 days    Minutes of Exercise per Session: 0 min  Stress: No Stress Concern Present (04/08/2023)   [employer] of Occupational Health - Occupational Stress Questionnaire    Feeling of Stress : Not at all  Social Connections: Moderately Integrated (04/08/2023)   Social Connection and Isolation Panel [NHANES]    Frequency of Communication with Friends and Family: Twice a week    Frequency of Social Gatherings with Friends and Family: Three times a week    Attends Religious Services: More than 4 times per year    Active Member of Clubs or Organizations: Yes    Attends Banker Meetings: 1 to 4 times per year    Marital Status: Never married    MEDICATIONS:  Current Outpatient Medications  Medication Sig Dispense Refill   alendronate (FOSAMAX) 70 MG tablet TAKE 1 TABLET BY MOUTH ONCE A WEEK FIRST THING EVERY MORNING ON AN EMPTY STOMACH AT LEAST 30 MINUTES BEFORE TAKING OTHER MEDS. 4 tablet 5    amLODipine (NORVASC) 10 MG tablet TAKE 1 TABLET(10 MG) BY MOUTH DAILY 30 tablet 0   atenolol (TENORMIN) 25 MG tablet TAKE 1 TABLET(25 MG) BY MOUTH DAILY 30 tablet 0   Blood Pressure Monitor DEVI Use as directed to check home blood pressure 2-3 times a week 1 Device 0   busPIRone (BUSPAR) 15 MG tablet TAKE 1 TABLET(15 MG) BY MOUTH TWICE DAILY 60 tablet 0   fluticasone (FLONASE) 50 MCG/ACT nasal spray Place 1 spray into both nostrils daily as needed for allergies or rhinitis. 16 g 6   Multiple Vitamin (MULTIVITAMIN WITH MINERALS) TABS tablet Take 1 tablet by mouth daily.      traZODone (DESYREL) 50 MG tablet TAKE 1 AND 1/2 TABLETS BY MOUTH EVERY NIGHT AT BEDTIME 45 tablet 0   trihexyphenidyl (ARTANE) 2 MG tablet Take by mouth.     No current facility-administered medications for this visit.    PHYSICAL EXAM: Vitals:   11/30/23 1338  BP: 110/70  Pulse: 73  Resp: 20  SpO2: 96%  Weight: 155 lb (70.3 kg)  Height: 5\' 3"  (1.6 m)   Body mass index is 27.46 kg/m.  Wt Readings from Last 3 Encounters:  11/30/23 155 lb (70.3 kg)  09/14/23 155 lb 1.6 oz (70.4 kg)  09/03/23 155 lb (70.3 kg)    General: Well developed, well nourished female in no apparent distress.  HEENT: AT/, no external lesions. Hearing intact to the spoken word Eyes: EOMI. Conjunctiva clear and no icterus. Neck: Trachea midline, neck supple without appreciable thyromegaly or lymphadenopathy and no palpable thyroid nodules Lungs: Clear to auscultation, no wheeze. Respirations not labored Heart: S1S2, Regular in rate and rhythm.  Abdomen: Soft, non tender, non distended Neurologic: Alert, oriented, normal speech, deep tendon biceps reflexes normal,  no gross focal neurological deficit Extremities: No pedal pitting edema, no tremors of outstretched hands. No spine tenderness Skin: Warm, color good.  Psychiatric: Does not appear depressed or anxious  PERTINENT HISTORIC LABORATORY AND IMAGING STUDIES:  All pertinent  laboratory results were reviewed. Please see HPI also for  further details.   Lab Results  Component Value Date   ALKPHOS 68 12/01/2022   ALKPHOS 67 11/25/2021   ALKPHOS 64 11/22/2020    ASSESSMENT / PLAN  1. Age-related osteoporosis without current pathological fracture     Ms. Heyward is a 68 y.o. old female with osteoporosis based on DXA scan results in December 2020 and August 2024, lowest T-score of -3.5 at lumbar spine. Patient has multiple risk factors for osteoporosis, including age, postmenopausal  status.  Patient has been on tamoxifen from February 2020.  She has been on Fosamax 70 mg weekly from August 2024.  Plan: - Due to severe osteoporosis with T-score of -3.5, I recommended and discussed injectable antiresorptive therapy Reclast annually.  Patient does not prefer to be on injectable medication.  She wants to continue on oral current medication Fosamax. -She reported that she may plan for dental procedures in the future however no specific plan at this time and reportedly no recommendation by dentist as well. -Will continue Fosamax 70 mg weekly. -Advised patient to take calcium/vitamin D 1 tablet 2 times a day. -Will check BMP with EGFR, albumin, PTH, protein electrophoresis for workup for osteoporosis. -Regular weight bearing exercises recommended as tolerated by the patient.  Fall precautions discussed.   Diagnoses and all orders for this visit:  Age-related osteoporosis without current pathological fracture -     BASIC METABOLIC PANEL WITH GFR -     Albumin -     Parathyroid hormone, intact (no Ca) -     Serum protein electrophoresis with reflex    DISPOSITION Follow up in clinic in 6 months suggested.  All questions answered and patient verbalized understanding of the plan.  Kimberly Braylin Xu, MD [facility] Endocrinology [facility] [address] [place], [address] [place], [place] [address] Phone # [phone]  At least part of this note was generated  using voice recognition software. Inadvertent word errors may have occurred, which were not recognized during the proofreading process.

## 2023-12-02 LAB — PROTEIN ELECTROPHORESIS, SERUM, WITH REFLEX
Albumin ELP: 4.1 g/dL (ref 3.8–4.8)
Alpha 1: 0.4 g/dL — ABNORMAL HIGH (ref 0.2–0.3)
Alpha 2: 0.8 g/dL (ref 0.5–0.9)
Beta 2: 0.4 g/dL (ref 0.2–0.5)
Beta Globulin: 0.5 g/dL (ref 0.4–0.6)
Gamma Globulin: 1.3 g/dL (ref 0.8–1.7)
Total Protein: 7.5 g/dL (ref 6.1–8.1)

## 2023-12-02 LAB — BASIC METABOLIC PANEL WITH GFR
BUN: 15 mg/dL (ref 7–25)
CO2: 26 mmol/L (ref 20–32)
Calcium: 9.6 mg/dL (ref 8.6–10.4)
Chloride: 106 mmol/L (ref 98–110)
Creat: 0.74 mg/dL (ref 0.50–1.05)
Glucose, Bld: 81 mg/dL (ref 65–99)
Potassium: 4.4 mmol/L (ref 3.5–5.3)
Sodium: 140 mmol/L (ref 135–146)
eGFR: 88 mL/min/{1.73_m2} (ref >=60)

## 2023-12-02 LAB — PARATHYROID HORMONE, INTACT (NO CA): PTH: 78 pg/mL — ABNORMAL HIGH (ref 16–77)

## 2023-12-02 LAB — ALBUMIN: Albumin: 4.1 g/dL (ref 3.6–5.1)

## 2023-12-03 ENCOUNTER — Encounter: Payer: Self-pay | Admitting: Endocrinology

## 2023-12-14 ENCOUNTER — Ambulatory Visit: Payer: Medicare Other | Attending: Internal Medicine | Admitting: Internal Medicine

## 2023-12-14 ENCOUNTER — Encounter: Payer: Self-pay | Admitting: Internal Medicine

## 2023-12-14 VITALS — BP 118/73 | HR 73 | Temp 97.8°F | Ht 63.0 in | Wt 152.0 lb

## 2023-12-14 DIAGNOSIS — R413 Other amnesia: Secondary | ICD-10-CM | POA: Diagnosis not present

## 2023-12-14 DIAGNOSIS — G250 Essential tremor: Secondary | ICD-10-CM | POA: Insufficient documentation

## 2023-12-14 DIAGNOSIS — Z5941 Food insecurity: Secondary | ICD-10-CM | POA: Insufficient documentation

## 2023-12-14 DIAGNOSIS — Z8249 Family history of ischemic heart disease and other diseases of the circulatory system: Secondary | ICD-10-CM | POA: Diagnosis not present

## 2023-12-14 DIAGNOSIS — Z853 Personal history of malignant neoplasm of breast: Secondary | ICD-10-CM | POA: Diagnosis not present

## 2023-12-14 DIAGNOSIS — Z79899 Other long term (current) drug therapy: Secondary | ICD-10-CM | POA: Insufficient documentation

## 2023-12-14 DIAGNOSIS — G252 Other specified forms of tremor: Secondary | ICD-10-CM

## 2023-12-14 DIAGNOSIS — Z760 Encounter for issue of repeat prescription: Secondary | ICD-10-CM | POA: Insufficient documentation

## 2023-12-14 DIAGNOSIS — M818 Other osteoporosis without current pathological fracture: Secondary | ICD-10-CM | POA: Insufficient documentation

## 2023-12-14 DIAGNOSIS — R7303 Prediabetes: Secondary | ICD-10-CM | POA: Diagnosis not present

## 2023-12-14 DIAGNOSIS — Z923 Personal history of irradiation: Secondary | ICD-10-CM | POA: Insufficient documentation

## 2023-12-14 DIAGNOSIS — I10 Essential (primary) hypertension: Secondary | ICD-10-CM | POA: Insufficient documentation

## 2023-12-14 LAB — GLUCOSE, POCT (MANUAL RESULT ENTRY): POC Glucose: 85 mg/dL (ref 70–99)

## 2023-12-14 LAB — POCT GLYCOSYLATED HEMOGLOBIN (HGB A1C): HbA1c, POC (controlled diabetic range): 5.7 % (ref 0.0–7.0)

## 2023-12-14 MED ORDER — ATENOLOL 25 MG PO TABS: ORAL_TABLET | ORAL | 1 refills | Status: DC

## 2023-12-14 MED ORDER — BUSPIRONE HCL 15 MG PO TABS: 15.0000 mg | ORAL_TABLET | Freq: Two times a day (BID) | ORAL | 1 refills | Status: DC

## 2023-12-14 MED ORDER — AMLODIPINE BESYLATE 10 MG PO TABS: ORAL_TABLET | ORAL | 1 refills | Status: DC

## 2023-12-14 NOTE — Progress Notes (Signed)
 Patient ID: Kimberly [name], female    DOB: 03/16/1956  MRN: [id number]  CC: Hypertension (HTN & prediabetes f/u. Med refill. Franchot Erichsen supplement side effects//Already received flu vax)   Subjective: Kimberly [name] is a 68 y.o. female who presents for chronic ds management. 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   Discussed the use of AI scribe software for clinical note transcription with the patient, who gave verbal consent to proceed.  History of Present Illness   The patient, with a history of hypertension, osteoporosis, tremors, and prediabetes, presents for a follow-up visit. She reports adherence to her antihypertensive regimen of amlodipine 10mg  and atenolol 25mg  daily, and she has been limiting her salt intake.  Osteoporosis: Regarding her osteoporosis, she recently saw an endocrinologist, Dr. Erroll Luna, who recommended Reclast inj due to the severity of her condition but patient declined.  So we recommended that she continues Fosamax once a week and Ca+Vit D daily.  However, the patient does not recall this recommendation of Reclast. She continues to take Fosamax once a week without any reported side effects such as heartburn.  She has been taking over-the-counter calcium and vitamin D supplements twice daily, but reports that these cause diarrhea. She is considering alternative sources of these nutrients, such as lactose-free milk or goat milk. She has been advised to discontinue Fosamax 1-2 months prior to any dental procedures.  She wonders whether she needs to stop it when she has dental cleaning.  Tremors: The patient's tremors have not improved.  Will try to get her back in with Dr. Frances Furbish her previous neurologist here in [place].  However she has recommended thst pt continues care at [facility] an [facility] c[facility] where she had referred her as she has  nothing further to offer.  When she was seeing them, they put her on Artane ; the patient does not recall the medication is unsure if she is still taking it.  PreDM:   Results for orders placed or performed in visit on 12/14/23  POCT glucose (manual entry)   Collection Time: 12/14/23 10:51 AM  Result Value Ref Range   POC Glucose 85 70 - 99 mg/dl  POCT glycosylated hemoglobin (Hb A1C)   Collection Time: 12/14/23 11:10 AM  Result Value Ref Range   Hemoglobin A1C     HbA1c POC (<> result, manual entry)     HbA1c, POC (prediabetic range)     HbA1c, POC (controlled diabetic range) 5.7 0.0 - 7.0 %  Doing okay with her eating habits.  History of DCIS LT breast:  The patient has also been taking tamoxifen for five years following a cancer diagnosis, but is unsure if she should continue this medication.  On her last visit with the oncologist, looks like he stopped the medication.  Poor memory: I note that over the yrs in seeing her, her memory has progressively gotten worse.  She too endorses a decline in her memory, often needing to write things down to remember them.  She tells me that she is at home alone most of the time as her daughter works and the grandkids have afterschool activities.  She plays games on her phone.  She is not sure whether she is depressed     Patient Active Problem List   Diagnosis Date Noted   Onychomycosis 09/03/2023   Bilateral hand pain 09/03/2023   Tic disorder  03/22/2021   Current mild episode of major depressive disorder without prior episode (HCC) 03/22/2021   Prediabetes 04/19/2020   Muscle cramps 04/19/2020   Unintended weight loss 04/19/2020   History of rheumatic fever as a child 03/17/2019   Genetic testing 10/25/2018   Family history of breast cancer    Family history of lung cancer    Ductal carcinoma in situ (DCIS) of left breast 09/07/2018   Intention tremor 10/15/2017   Diabetes mellitus screening 06/10/2017   Breast nodule 03/02/2016    Seasonal allergies 01/07/2016   Right shoulder pain 09/28/2015   Chronic insomnia 07/06/2015   Tinea pedis 07/06/2015   Nocturia 08/25/2014   Chronic anxiety 08/25/2014   Heme positive stool 08/16/2014   HTN (hypertension) 08/16/2014     Current Outpatient Medications on File Prior to Visit  Medication Sig Dispense Refill   alendronate (FOSAMAX) 70 MG tablet TAKE 1 TABLET BY MOUTH ONCE A WEEK FIRST THING EVERY MORNING ON AN EMPTY STOMACH AT LEAST 30 MINUTES BEFORE TAKING OTHER MEDS. 4 tablet 5   Blood Pressure Monitor DEVI Use as directed to check home blood pressure 2-3 times a week 1 Device 0   fluticasone (FLONASE) 50 MCG/ACT nasal spray Place 1 spray into both nostrils daily as needed for allergies or rhinitis. 16 g 6   Multiple Vitamin (MULTIVITAMIN WITH MINERALS) TABS tablet Take 1 tablet by mouth daily.      traZODone (DESYREL) 50 MG tablet TAKE 1 AND 1/2 TABLETS BY MOUTH EVERY NIGHT AT BEDTIME 45 tablet 0   trihexyphenidyl (ARTANE) 2 MG tablet Take by mouth.     No current facility-administered medications on file prior to visit.    Allergies  Allergen Reactions   Codeine Other (See Comments)    Cold sweats    Social History   Socioeconomic History   Marital status: Single    Spouse name: Not on file   Number of children: 2   Years of education: Not on file   Highest education level: 12th grade  Occupational History   Not on file  Tobacco Use   Smoking status: Never    Passive exposure: Never   Smokeless tobacco: Never  Vaping Use   Vaping status: Never Used  Substance and Sexual Activity   Alcohol use: No    Alcohol/week: 0.0 standard drinks of alcohol   Drug use: No   Sexual activity: Not Currently    Birth control/protection: Post-menopausal  Other Topics Concern   Not on file  Social History Narrative   Lives with daughter, 5 grandchildren   Social Drivers of Health   Financial Resource Strain: Low Risk  (12/14/2023)   Overall Financial Resource  Strain (CARDIA)    Difficulty of Paying Living Expenses: Not hard at all  Food Insecurity: Food Insecurity Present (12/14/2023)   Hunger Vital Sign    Worried About Running Out of Food in the Last Year: Never true    Ran Out of Food in the Last Year: Often true  Transportation Needs: No Transportation Needs (12/14/2023)   PRAPARE - Administrator, Civil Service (Medical): No    Lack of Transportation (Non-Medical): No  Physical Activity: Insufficiently Active (12/14/2023)   Exercise Vital Sign    Days of Exercise per Week: 2 days    Minutes of Exercise per Session: 20 min  Stress: Stress Concern Present (12/14/2023)   [employer] of Occupational Health - Occupational Stress Questionnaire    Feeling of Stress : To  some extent  Social Connections: Moderately Integrated (12/14/2023)   Social Connection and Isolation Panel [NHANES]    Frequency of Communication with Friends and Family: More than three times a week    Frequency of Social Gatherings with Friends and Family: More than three times a week    Attends Religious Services: More than 4 times per year    Active Member of Clubs or Organizations: Yes    Attends Banker Meetings: More than 4 times per year    Marital Status: Never married  Intimate Partner Violence: Not At Risk (12/14/2023)   Humiliation, Afraid, Rape, and Kick questionnaire    Fear of Current or Ex-Partner: No    Emotionally Abused: No    Physically Abused: No    Sexually Abused: No    Family History  Problem Relation Age of Onset   Breast cancer Mother 55   Cancer Maternal Grandmother        thinks it was braest, but not sure what type of cancer, dx >50   Heart disease Paternal Grandfather    Lung cancer Maternal Aunt    Colon cancer Neg Hx     Past Surgical History:  Procedure Laterality Date   APPENDECTOMY     bowel obstruction     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   BREAST LUMPECTOMY WITH RADIOACTIVE SEED LOCALIZATION Left  10/14/2018   Procedure: BREAST LUMPECTOMY WITH RADIOACTIVE SEED LOCALIZATION X2;  Surgeon: Harriette Bouillon, MD;  Location: [facility] OR;  Service: General;  Laterality: Left;   CHOLECYSTECTOMY     COLONOSCOPY WITH PROPOFOL N/A 08/05/2018   Procedure: COLONOSCOPY WITH PROPOFOL;  Surgeon: Corbin Ade, MD;  Location: [facility];  Service: Endoscopy;  Laterality: N/A;  12:45pm   TONSILLECTOMY      ROS: Review of Systems Negative except as stated above  PHYSICAL EXAM: BP 118/73 (BP Location: Left Arm, Patient Position: Sitting, Cuff Size: Normal)   Pulse 73   Temp 97.8 F (36.6 C) (Oral)   Ht 5\' 3"  (1.6 m)   Wt 152 lb (68.9 kg)   SpO2 94%   BMI 26.93 kg/m   Physical Exam  General appearance - alert, well appearing, elderly AAF and in no distress Mental status -patient noted over the past several visit to be increasingly forgetful.   Neck - supple, no significant adenopathy Chest - clear to auscultation, no wheezes, rales or rhonchi, symmetric air entry Heart - normal rate, regular rhythm, normal S1, S2, no murmurs, rubs, clicks or gallops Extremities - peripheral pulses normal, no pedal edema, no clubbing or cyanosis Neuro: Patient with persistent movement of the head.    12/14/2023   12:03 PM 01/02/2021    3:55 PM  MMSE - Mini Mental State Exam  Orientation to time 4 5  Orientation to Place 5 5  Registration 3 3  Attention/ Calculation 4 5  Recall 2 3  Language- name 2 objects 2 2  Language- repeat 1 1  Language- follow 3 step command 2 3  Language- read & follow direction 1 1  Write a sentence 1 1  Copy design 1 1  Total score 26 30      12/14/2023   11:02 AM 08/11/2023   11:41 AM 04/08/2023    1:13 PM  Depression screen PHQ 2/9  Decreased Interest 0 1 2  Down, Depressed, Hopeless 2 2 3   PHQ - 2 Score 2 3 5   Altered sleeping 3 0 2  Tired, decreased  energy 0 0 0  Change in appetite 0 0 0  Feeling bad or failure about yourself  1 2 0  Trouble concentrating 0 0 0   Moving slowly or fidgety/restless 0 0 0  Suicidal thoughts 0 0 0  PHQ-9 Score 6 5 7   Difficult doing work/chores Not difficult at all Not difficult at all        Latest Ref Rng & Units 11/30/2023    3:06 PM 06/11/2023    3:03 PM 12/01/2022   12:25 PM  CMP  Glucose 65 - 99 mg/dL 81  93  90   BUN 7 - 25 mg/dL 15  13  10    Creatinine 0.50 - 1.05 mg/dL 2.95  6.21  3.08   Sodium 135 - 146 mmol/L 140  140  141   Potassium 3.5 - 5.3 mmol/L 4.4  3.9  4.7   Chloride 98 - 110 mmol/L 106  108  105   CO2 20 - 32 mmol/L 26  20  20    Calcium 8.6 - 10.4 mg/dL 9.6  9.6  9.7   Total Protein 6.1 - 8.1 g/dL 7.5   7.4   Total Bilirubin 0.0 - 1.2 mg/dL   <6.5   Alkaline Phos 44 - 121 IU/L   68   AST 0 - 40 IU/L   21   ALT 0 - 32 IU/L   13    Lipid Panel     Component Value Date/Time   CHOL 184 12/01/2022 1225   TRIG 122 12/01/2022 1225   HDL 66 12/01/2022 1225   CHOLHDL 2.8 12/01/2022 1225   CHOLHDL 2.7 11/13/2016 1102   VLDL 25 11/13/2016 1102   LDLCALC 97 12/01/2022 1225    CBC    Component Value Date/Time   WBC 7.4 12/01/2022 1225   WBC 6.1 10/08/2018 0919   RBC 4.74 12/01/2022 1225   RBC 4.84 10/08/2018 0919   HGB 14.3 12/01/2022 1225   HCT 43.1 12/01/2022 1225   PLT 428 12/01/2022 1225   MCV 91 12/01/2022 1225   MCH 30.2 12/01/2022 1225   MCH 30.4 10/08/2018 0919   MCHC 33.2 12/01/2022 1225   MCHC 32.7 10/08/2018 0919   RDW 13.0 12/01/2022 1225   LYMPHSABS 1.9 10/08/2018 0919   MONOABS 0.6 10/08/2018 0919   EOSABS 0.2 10/08/2018 0919   BASOSABS 0.1 10/08/2018 0919    ASSESSMENT AND PLAN: 1. Essential hypertension (Primary) At goal. Continue Norvasc and Atenolol - amLODipine (NORVASC) 10 MG tablet; TAKE 1 TABLET(10 MG) BY MOUTH DAILY  Dispense: 90 tablet; Refill: 1 - atenolol (TENORMIN) 25 MG tablet; TAKE 1 TABLET(25 MG) BY MOUTH DAILY  Dispense: 90 tablet; Refill: 1  2. Other osteoporosis without current pathological fracture I recommend that she use tries taking the  calcium plus vitamin D supplement that she purchased over-the-counter once a day for 2 to 3 weeks.  If she does not have any recurrence of diarrhea then she should increase it to twice a day.  If diarrhea does occur, then she would have to increase intake of calcium rich foods like 2% milk - VITAMIN D 25 Hydroxy (Vit-D Deficiency, Fractures)  3. Coarse tremors I will try again to get her back in with neurology at [facility]. - Ambulatory referral to Neurology  4. Prediabetes Encourage healthy eating habits - POCT glycosylated hemoglobin (Hb A1C) - POCT glucose (manual entry)  5. Memory changes Concerning for possible dementia.  We will get a CAT scan of the  head, check a B12 level, get her in with neurology and refer to psychology for neuropsychiatric evaluation. MMSE score has declined compared to 3 yrs ago - Ambulatory referral to Neurology - Vitamin B12 - Ambulatory referral to Psychology - CT HEAD WO CONTRAST ( ); Future   Patient was given the opportunity to ask questions.  Patient verbalized understanding of the plan and was able to repeat key elements of the plan.   This documentation was completed using Paediatric nurse.  Any transcriptional errors are unintentional.  Orders Placed This Encounter  Procedures   CT HEAD WO CONTRAST ( )   Vitamin B12   VITAMIN D 25 Hydroxy (Vit-D Deficiency, Fractures)   Ambulatory referral to Neurology   Ambulatory referral to Psychology   POCT glycosylated hemoglobin (Hb A1C)   POCT glucose (manual entry)     Requested Prescriptions   Signed Prescriptions Disp Refills   amLODipine (NORVASC) 10 MG tablet 90 tablet 1    Sig: TAKE 1 TABLET(10 MG) BY MOUTH DAILY   atenolol (TENORMIN) 25 MG tablet 90 tablet 1    Sig: TAKE 1 TABLET(25 MG) BY MOUTH DAILY   busPIRone (BUSPAR) 15 MG tablet 180 tablet 1    Sig: Take 1 tablet (15 mg total) by mouth 2 (two) times daily.    Return in about 4 months (around  04/14/2024).  Jonah Blue, MD, FACP

## 2023-12-14 NOTE — Patient Instructions (Addendum)
 We have referred you back to [facility] for the tremors.  I have also asked the neurologist to see you for memory changes.  Try playing word games and puzzles to help keep your memory sharp.

## 2023-12-15 LAB — VITAMIN B12: Vitamin B-12: 767 pg/mL (ref 232–1245)

## 2023-12-15 LAB — VITAMIN D 25 HYDROXY (VIT D DEFICIENCY, FRACTURES): Vit D, 25-Hydroxy: 39.5 ng/mL (ref 30.0–100.0)

## 2024-01-05 ENCOUNTER — Other Ambulatory Visit: Payer: Self-pay | Admitting: Internal Medicine

## 2024-01-05 DIAGNOSIS — F320 Major depressive disorder, single episode, mild: Secondary | ICD-10-CM

## 2024-01-11 ENCOUNTER — Ambulatory Visit
Admission: RE | Admit: 2024-01-11 | Discharge: 2024-01-11 | Disposition: A | Discharge status: Home / Self Care | Source: Ambulatory Visit | Attending: Internal Medicine | Admitting: Internal Medicine

## 2024-01-11 DIAGNOSIS — R413 Other amnesia: Secondary | ICD-10-CM

## 2024-01-19 NOTE — Progress Notes (Signed)
 Referring Provider: Marylyn Anette Dragon, PA Primary Provider: Maree KATHEE Louder, MD MRN: [id number]   Chief Complaint: Routine Cervical dystonia/Spasmodic torticollis and ET f/up   HPI:  PATIENT PERTINENT NEUROLOGIC HISTORY SUMMARY: Kimberly [name] is a 68 y.o. right-handed female with anxiety, insomnia, HTN, breast mass  (DCIS) s/p radiation and on Tamoxifen   who presents for follow up of tremors. Accompanied by her friend who assists with history gathering.  She reports progressive arm and head tremors over the past since around 2017-18, changes to handwriting such that it is sloppy, changes to voice that sometimes it is hard to understand speech and voice is choppy but no vocal tremors, and mild gait issues. She has no known family history of tremors or neurologic disease. On initial exam, she had normal tone, no bradykinesia and primarily intention tremor, milder action tremor, no resting tremor, minimal tremor intrusion on spiral drawing and mild on handwriting sample. However she had a prominent primarily yes yes head tremor and a subtle left laterocollis and anterior sagittal shift/antecollis. She had intermittent choppy dysarthria but no vocal tremor.   Current Movement Medication: Atenolol  25mg  daily - for BP Melatonin 5mg  nightly - added at this visit 01/29/24 Artane 2mg  - 1 tab BID - increasing to 2 tab in am and 2 tab in pm over the course of next couple weeks - changed at this visit 01/29/24  Subjective: Interim Hx  Patient is here for routine Cervical dystonia/Spasmodic torticollis with ET f/up with self-accompanied. Last seen in clinic by Dr. Hans on 07/29/22 and myself 01/28/23  Since last visit, for some reason, they did not fill her Artane. She has been off this medication and her head dystonia/spasmodic torticollis is worse.   Lately, she also reporting hard time staying asleep as well.   She is still hesitant about Botox and wants to hold off for now.     The  following portions of the patient's history were reviewed/updated as appropriate:  Social/Family History/PMH/Past Surgical History   Allergies: Allergies  Allergen Reactions  . Codeine Other (See Comments)    Cold sweats      Current Medication: Current Outpatient Medications  Medication Instructions  . amLODIPine  (NORVASC ) 10 mg, Daily  . atenoloL  (TENORMIN ) 25 mg tablet   . busPIRone  (BUSPAR ) 15 mg, 2 times daily  . fluticasone  propionate (FLONASE ) 50 mcg/spray nasal spray 1 spray  . multivitamin cap 1 tablet, Daily  . tamoxifen  (NOLVADEX ) 20 mg  . traZODone  (DESYREL ) 75 mg, Nightly  . trihexyphenidyL (ARTANE) 2 mg tablet Take 2 tabs in am and 2 tabs in pm     Review of Systems Complete systems 10+ fully reviewed and are negative except for what is noted above  Objective:   Vitals:   01/29/24 1352  BP: 126/84  Pulse: 79  Temp: 97.1 F (36.2 C)  SpO2: 97%  Weight: 70.1 kg (154 lb 9.6 oz)  Height: 1.626 m (5' 4)       Physical Exam:  Gen: Alert. NAD Ext: No c/c/e   Tremor Rating Scale: In Media Tab 01/29/24   0: none 1: Slight. May be intermittent. 2: Moderate amplitude. May be intermittent 3: Marked amplitude 4: Severe amplitude   Examination Treatment States    Propranolol/Primidone /DBS: On BB -Atenolol   Visit Type: General Followup  Tremor Rating Scale 1. Face tremor: 0  2. Tongue Tremor:          a. Rest 0        b. Posture 0  3. VoiceTremor: 2  4. Head tremor          a. Rest 3        b. Posture   3  5. Right Arm tremor          a. Rest 0        b. Posture 0        c. Action 1  6. Left Arm tremor          a. Rest 0        d. Posture 0        e. Action 1  7. Trunk tremor          a. Rest 0        b. Posture 0  8. Right Leg tremor          a. Rest 0        b. Posture 0        c. Action 0  9. Left Leg tremor          a. Rest 0        b. Posture 0        c. Action 0  10. Handwriting 1  11. Drawing A (large spiral)          a.  Right 2        b. Left 2-3  12. Drawing B (smallspiral)          a. Right 3        b. Left 3  13. Drawing C (lines)          a. Right 2        a. Left 3     Assessment:   Kimberly [name] is a 68 y.o. R-handed female with Cervical dystonia/Spasmodic torticollis with ET  Plan:   Worsening spasmodic torticollis as well as termor since being off Artane. In the process of trying to appeal on this for her. Unclear why it was rejected given she was on it before and we are just going higher on the dosing. They wanted her to try Amantadine first though it is not indicated for her condition. If not approved, will have to consider it and maybe using Primidone  to at least targeting her ET. She does not want to try Primidone  or Amantadine just yet. She wants to wait to see if they will approve the Artane for her first. Resend the script again and she will let us  know if they fill it for her.   We again discussed Botox but she is still wanting to hold off at this time.  We also discussed about her reported issue with sleep. Recommend for trial with Melatonin. Advised to take it at least 1hr before bedtime.   Changes Made in Movement Disorder Medication: Melatonin 5mg  nightly - added at this visit 01/29/24 Artane 2mg  - 1 tab BID - increasing to 2 tab in am and 2 tab in pm over the course of next couple weeks - changed at this visit 01/29/24   Medication/Orders placed this encounter: 1. Other specified forms of tremor      2. Spasmodic torticollis  trihexyphenidyL (ARTANE) 2 mg tablet        Follow up with Dr. Hans in 6 months Pt was asked to call the clinic with any questions/concerns in the meantime.   Total time spent face to face was 30 minutes out of which more than half of which was spent in discussion  and counseling with regards to patient education, symptom management and treatment options. Professional time spent includes other activities besides direct patient care (charting,  reviewing records, etc) in addition to those noted in the documentation: 15    Sincerely,  Electronically signed by: Twana Mora Seip, [place] Tue 01/19/2024 2:12 PM [facility]

## 2024-03-11 ENCOUNTER — Other Ambulatory Visit: Payer: Self-pay | Admitting: Internal Medicine

## 2024-03-11 DIAGNOSIS — I10 Essential (primary) hypertension: Secondary | ICD-10-CM

## 2024-03-11 NOTE — Telephone Encounter (Signed)
 Copied from CRM [id number]. Topic: Clinical - Medication Refill >> Mar 11, 2024  9:28 AM Alpha Arts wrote: Medication:atenolol  (TENORMIN ) 25 MG tablet   Has the patient contacted their pharmacy? Yes (Agent: If no, request that the patient contact the pharmacy for the refill. If patient does not wish to contact the pharmacy document the reason why and proceed with request.) (Agent: If yes, when and what did the pharmacy advise?)  This is the patient's preferred pharmacy:  [id number] Jonette Nestle, [place] - [address] AT [facility] & [address] [place] [address] [place] [address] Phone: [phone] Fax: [phone]  Is this the correct pharmacy for this prescription? Yes If no, delete pharmacy and type the correct one.   Has the prescription been filled recently? Yes  Is the patient out of the medication? No  Has the patient been seen for an appointment in the last year OR does the patient have an upcoming appointment? Yes  Can we respond through MyChart? Yes  Agent: Please be advised that Rx refills may take up to 3 business days. We ask that you follow-up with your pharmacy.

## 2024-03-12 MED ORDER — ATENOLOL 25 MG PO TABS: ORAL_TABLET | ORAL | 0 refills | Status: DC

## 2024-03-12 NOTE — Telephone Encounter (Signed)
 Requested Prescriptions  Pending Prescriptions Disp Refills   atenolol  (TENORMIN ) 25 MG tablet 90 tablet 0    Sig: TAKE 1 TABLET(25 MG) BY MOUTH DAILY     Cardiovascular: Beta Blockers 2 Passed - 03/12/2024  4:28 PM      Passed - Cr in normal range and within 360 days    Creat  Date Value Ref Range Status  11/30/2023 0.74 0.50 - 1.05 mg/dL Final         Passed - Last BP in normal range    BP Readings from Last 1 Encounters:  12/14/23 118/73         Passed - Last Heart Rate in normal range    Pulse Readings from Last 1 Encounters:  12/14/23 73         Passed - Valid encounter within last 6 months    Recent Outpatient Visits           2 months ago Essential hypertension   [facility] [place]. [facility] Lawrance Presume, MD   7 months ago Essential hypertension   [facility] [place] Lawrance Presume, MD   11 months ago Essential hypertension   [facility] [place]. [facility] Lawrance Presume, MD   1 year ago Essential hypertension   [facility] [place]. [facility] Lawrance Presume, MD   1 year ago Essential hypertension   [facility] [place]. [facility] Lawrance Presume, MD

## 2024-04-14 ENCOUNTER — Ambulatory Visit: Attending: Internal Medicine | Admitting: Internal Medicine

## 2024-04-14 ENCOUNTER — Encounter: Payer: Self-pay | Admitting: Internal Medicine

## 2024-04-14 VITALS — BP 106/66 | HR 70 | Ht 63.0 in | Wt 144.0 lb

## 2024-04-14 DIAGNOSIS — Z853 Personal history of malignant neoplasm of breast: Secondary | ICD-10-CM | POA: Diagnosis not present

## 2024-04-14 DIAGNOSIS — Z760 Encounter for issue of repeat prescription: Secondary | ICD-10-CM | POA: Insufficient documentation

## 2024-04-14 DIAGNOSIS — M436 Torticollis: Secondary | ICD-10-CM | POA: Insufficient documentation

## 2024-04-14 DIAGNOSIS — R682 Dry mouth, unspecified: Secondary | ICD-10-CM | POA: Diagnosis not present

## 2024-04-14 DIAGNOSIS — Z923 Personal history of irradiation: Secondary | ICD-10-CM | POA: Insufficient documentation

## 2024-04-14 DIAGNOSIS — G259 Extrapyramidal and movement disorder, unspecified: Secondary | ICD-10-CM | POA: Insufficient documentation

## 2024-04-14 DIAGNOSIS — K0889 Other specified disorders of teeth and supporting structures: Secondary | ICD-10-CM | POA: Diagnosis not present

## 2024-04-14 DIAGNOSIS — Z7983 Long term (current) use of bisphosphonates: Secondary | ICD-10-CM | POA: Insufficient documentation

## 2024-04-14 DIAGNOSIS — Z79899 Other long term (current) drug therapy: Secondary | ICD-10-CM | POA: Insufficient documentation

## 2024-04-14 DIAGNOSIS — R413 Other amnesia: Secondary | ICD-10-CM | POA: Diagnosis not present

## 2024-04-14 DIAGNOSIS — M818 Other osteoporosis without current pathological fracture: Secondary | ICD-10-CM | POA: Diagnosis not present

## 2024-04-14 DIAGNOSIS — I10 Essential (primary) hypertension: Secondary | ICD-10-CM | POA: Diagnosis not present

## 2024-04-14 DIAGNOSIS — G250 Essential tremor: Secondary | ICD-10-CM | POA: Insufficient documentation

## 2024-04-14 DIAGNOSIS — R419 Unspecified symptoms and signs involving cognitive functions and awareness: Secondary | ICD-10-CM | POA: Diagnosis not present

## 2024-04-14 DIAGNOSIS — F419 Anxiety disorder, unspecified: Secondary | ICD-10-CM | POA: Diagnosis not present

## 2024-04-14 MED ORDER — ATENOLOL 25 MG PO TABS: ORAL_TABLET | ORAL | 1 refills | Status: DC

## 2024-04-14 NOTE — Progress Notes (Signed)
 Patient ID: Kimberly [name], female    DOB: 10-04-1956  MRN: [id number]  CC: Hypertension (HTN f/u. Med refill. Raguel about dry mouth, compulsive sucking of lips )   Subjective: Kimberly [name] is a 68 y.o. female who presents for chronic ds management. 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   Discussed the use of AI scribe software for clinical note transcription with the patient, who gave verbal consent to proceed.  History of Present Illness Kimberly [name] is a 68 year old female with hypertension, osteoporosis, and tremors who presents for a follow-up visit.  She manages hypertension with amlodipine  10 mg daily and atenolol  25 mg daily, with no chest pain or shortness of breath. She avoids salt in her diet.   Her tremors and involuntary movements have notable worsened compared to when I last saw her, now affecting her mouth, with associated dry mouth and frequent lip licking.  I had referred her back to [facility] neurology since Dr. Buck felt she did not have much more to offer her.  She did see neurology PA they are at [facility] on 01/19/2024.  She was noted to have worsening tremors and torticollis.  She was restarted on Artane 2 mg to take twice a day.  Patient did not recall that she had this visit without my prompting.  I asked about whether she filled the prescription for the medication and she thinks she did but does not know the name of the medicine.  She did not bring her medicines with her today.    For osteoporosis, she should be on alendronate  but is concerned about stopping it if dental extractions are needed due to tooth pain in the left lower jaw.   I continue to be concerned about declining memory.  I referred her for neuropsychiatric evaluation.  We referred her to [facility] but she  is not sure whether she was seen there as yet or not.  She experiences memory issues, such as forgetting she is on the phone while looking for it. She lives with her daughter and her family, and a friend assists with transportation to appointments.    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 4 tablet 5  amLODipine  (NORVASC ) 10 MG tablet TAKE 1 TABLET(10 MG) BY MOUTH DAILY 90 tablet 1   atenolol  (TENORMIN ) 25 MG tablet TAKE 1 TABLET(25 MG) BY MOUTH DAILY 90 tablet 0   Blood Pressure Monitor DEVI Use as directed to check home blood pressure 2-3 times a week 1 Device 0   busPIRone  (BUSPAR ) 15 MG tablet Take 1 tablet (15 mg total) by mouth 2 (two) times daily. 180 tablet 1   fluticasone  (FLONASE ) 50 MCG/ACT nasal spray Place 1 spray into both nostrils daily as needed for allergies or rhinitis. 16 g 6   Multiple Vitamin  (MULTIVITAMIN WITH MINERALS) TABS tablet Take 1 tablet by mouth daily.      traZODone  (DESYREL ) 50 MG tablet TAKE 1 AND 1/2 TABLETS BY MOUTH EVERY NIGHT AT BEDTIME 135 tablet 1   trihexyphenidyl (ARTANE) 2 MG tablet Take by mouth.     No current facility-administered medications on file prior to visit.    Allergies  Allergen Reactions   Codeine Other (See Comments)    Cold sweats    Social History   Socioeconomic History   Marital status: Single    Spouse name: Not on file   Number of children: 2   Years of education: Not on file   Highest education level: 12th grade  Occupational History   Not on file  Tobacco Use   Smoking status: Never    Passive exposure: Never   Smokeless tobacco: Never  Vaping Use   Vaping status: Never Used  Substance and Sexual Activity   Alcohol use: No    Alcohol/week: 0.0 standard drinks of alcohol   Drug use: No   Sexual activity: Not Currently    Birth control/protection: Post-menopausal  Other Topics Concern   Not on file  Social History Narrative   Lives with daughter, 5 grandchildren   Social Drivers of Health   Financial Resource Strain: Low Risk  (12/14/2023)   Overall Financial Resource Strain (CARDIA)    Difficulty of Paying Living Expenses: Not hard at all  Food Insecurity: Food Insecurity Present (12/14/2023)   Hunger Vital Sign    Worried About Running Out of Food in the Last Year: Never true    Ran Out of Food in the Last Year: Often true  Transportation Needs: No Transportation Needs (12/14/2023)   PRAPARE - Administrator, Civil Service (Medical): No    Lack of Transportation (Non-Medical): No  Physical Activity: Insufficiently Active (12/14/2023)   Exercise Vital Sign    Days of Exercise per Week: 2 days    Minutes of Exercise per Session: 20 min  Stress: Stress Concern Present (12/14/2023)   [employer] of Occupational Health - Occupational Stress Questionnaire    Feeling of Stress : To some extent  Social  Connections: Moderately Integrated (12/14/2023)   Social Connection and Isolation Panel    Frequency of Communication with Friends and Family: More than three times a week    Frequency of Social Gatherings with Friends and Family: More than three times a week    Attends Religious Services: More than 4 times per year    Active Member of [employer] or Organizations: Yes    Attends Banker Meetings: More than 4 times per year    Marital Status: Never married  Intimate Partner Violence: Not At Risk (12/14/2023)   Humiliation, Afraid, Rape, and Kick questionnaire    Fear of Current or Ex-Partner: No    Emotionally Abused: No    Physically  Abused: No    Sexually Abused: No    Family History  Problem Relation Age of Onset   Breast cancer Mother 71   Cancer Maternal Grandmother        thinks it was braest, but not sure what type of cancer, dx >50   Heart disease Paternal Grandfather    Lung cancer Maternal Aunt    Colon cancer Neg Hx     Past Surgical History:  Procedure Laterality Date   APPENDECTOMY     bowel obstruction     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   BREAST LUMPECTOMY WITH RADIOACTIVE SEED LOCALIZATION Left 10/14/2018   Procedure: BREAST LUMPECTOMY WITH RADIOACTIVE SEED LOCALIZATION X2;  Surgeon: Vanderbilt Ned, MD;  Location: [facility] OR;  Service: General;  Laterality: Left;   CHOLECYSTECTOMY     COLONOSCOPY WITH PROPOFOL  N/A 08/05/2018   Procedure: COLONOSCOPY WITH PROPOFOL ;  Surgeon: Shaaron Lamar HERO, MD;  Location: [facility] END[facility] SUITE;  Service: Endoscopy;  Laterality: N/A;  12:45pm   TONSILLECTOMY      ROS: Review of Systems Negative except as stated above  PHYSICAL EXAM: BP 106/66 (BP Location: Left Arm, Patient Position: Sitting, Cuff Size: Normal)   Pulse 70   Ht 5' 3 (1.6 m)   Wt 144 lb (65.3 kg)   SpO2 97%   BMI 25.51 kg/m   Physical Exam  General appearance - alert, well appearing, and in no distress Mental status -older African-American female in  NAD. Neck - supple, no significant adenopathy Mouth: Third molar in the left lower jaw is shaky when touched and produces discomfort Chest - clear to auscultation, no wheezes, rales or rhonchi, symmetric air entry Heart - normal rate, regular rhythm, normal S1, S2, no murmurs, rubs, clicks or gallops Neurological -patient has increased involuntary movement of the head, neck and smacking of the lips.  She is also fidgeting in her chair. Extremities -no lower extremity edema      Latest Ref Rng & Units 11/30/2023    3:06 PM 06/11/2023    3:03 PM 12/01/2022   12:25 PM  CMP  Glucose 65 - 99 mg/dL 81  93  90   BUN 7 - 25 mg/dL 15  13  10    Creatinine 0.50 - 1.05 mg/dL 9.25  9.26  9.18   Sodium 135 - 146 mmol/L 140  140  141   Potassium 3.5 - 5.3 mmol/L 4.4  3.9  4.7   Chloride 98 - 110 mmol/L 106  108  105   CO2 20 - 32 mmol/L 26  20  20    Calcium 8.6 - 10.4 mg/dL 9.6  9.6  9.7   Total Protein 6.1 - 8.1 g/dL 7.5   7.4   Total Bilirubin 0.0 - 1.2 mg/dL   <9.7   Alkaline Phos 44 - 121 IU/L   68   AST 0 - 40 IU/L   21   ALT 0 - 32 IU/L   13    Lipid Panel     Component Value Date/Time   CHOL 184 12/01/2022 1225   TRIG 122 12/01/2022 1225   HDL 66 12/01/2022 1225   CHOLHDL 2.8 12/01/2022 1225   CHOLHDL 2.7 11/13/2016 1102   VLDL 25 11/13/2016 1102   LDLCALC 97 12/01/2022 1225    CBC    Component Value Date/Time   WBC 7.4 12/01/2022 1225   WBC 6.1 10/08/2018 0919   RBC 4.74 12/01/2022 1225   RBC 4.84 10/08/2018 0919  HGB 14.3 12/01/2022 1225   HCT 43.1 12/01/2022 1225   PLT 428 12/01/2022 1225   MCV 91 12/01/2022 1225   MCH 30.2 12/01/2022 1225   MCH 30.4 10/08/2018 0919   MCHC 33.2 12/01/2022 1225   MCHC 32.7 10/08/2018 0919   RDW 13.0 12/01/2022 1225   LYMPHSABS 1.9 10/08/2018 0919   MONOABS 0.6 10/08/2018 0919   EOSABS 0.2 10/08/2018 0919   BASOSABS 0.1 10/08/2018 0919    ASSESSMENT AND PLAN: 1. Essential hypertension (Primary) At goal.  Continue Norvasc  and  atenolol  - atenolol  (TENORMIN ) 25 MG tablet; TAKE 1 TABLET(25 MG) BY MOUTH DAILY  Dispense: 90 tablet; Refill: 1  2. Movement disorder Patient with tremor and torticollis.  Seen at [facility] neurology clinic in April of this year.  Restarted on Artane but patient is not sure whether she has the medicine or not.  She did not bring medicines with her today.  Encouraged her to check her bottles when she returns home.  3. Other osteoporosis without current pathological fracture Continue once weekly Fosamax   4. Memory changes Encourage patient to bring a close friend or her daughter with her to each visit.  I am concerned with her memory decline.  She will need assistance I feel with managing her medications especially the medication for the movement disorder.  Advised to bring all medicines with her on subsequent visit.  I will have our referral coordinator check to see whether she had gotten an appointment with [facility] since last visit with me.  5. Tooth ache Advised patient that if she decides to get this molar pulled, she should stop the Fosamax  for 1 month prior.   Patient was given the opportunity to ask questions.  Patient verbalized understanding of the plan and was able to repeat key elements of the plan.   This documentation was completed using Paediatric nurse.  Any transcriptional errors are unintentional.  No orders of the defined types were placed in this encounter.    Requested Prescriptions   Pending Prescriptions Disp Refills   atenolol  (TENORMIN ) 25 MG tablet 90 tablet 0    Sig: TAKE 1 TABLET(25 MG) BY MOUTH DAILY    No follow-ups on file.  Barnie Louder, MD, FACP

## 2024-04-14 NOTE — Patient Instructions (Addendum)
 Please bring a family member or friend with you to each visit. Please bring all of your medications with you to each visit.  VISIT SUMMARY:  Today, we discussed your ongoing health concerns, including your tremors, memory issues, dental pain, and hypertension. We reviewed your current medications and made plans for further evaluations and follow-ups.  YOUR PLAN:  -TREMORS AND INVOLUNTARY MOVEMENTS: Your tremors and involuntary movements have worsened, now affecting your mouth. We will refer you to a neurologist at [facility] for further management. Please bring all your medications to your next visit for review.  -MEMORY DECLINE: You are experiencing memory issues, such as forgetting recent events and having trouble with medication recall. We will schedule a neuropsychiatric evaluation to better understand and manage these symptoms.  -DENTAL PAIN AND OSTEOPOROSIS: You have tooth pain in your left lower jaw and are concerned about dental extractions while on alendronate  for osteoporosis. If extractions are needed, you should stop taking alendronate  for a month. We will provide a list of local dentists for consultation.  -HYPERTENSION: Your blood pressure is well-controlled with your current medications. We will refill your atenolol  prescription for three months. Continue to avoid salt in your diet and take your medications as prescribed.  INSTRUCTIONS:  Please follow up with the neurologist at [facility] for your tremors and involuntary movements. Bring all your medications to your next visit for review. We will schedule a neuropsychiatric evaluation for your memory issues. Consult with a local dentist regarding your tooth pain and potential extractions, and stop taking alendronate  for a month if extractions are planned. Continue taking your hypertension medications as prescribed and avoid salt in your diet. Schedule a follow-up appointment after your vacation to review your  medications and specialist appointments.

## 2024-04-26 ENCOUNTER — Telehealth: Payer: Self-pay | Admitting: Internal Medicine

## 2024-04-26 NOTE — Telephone Encounter (Signed)
 [facility] calling for Kimberly [name]. [name] will try to email Kimberly as well.

## 2024-04-26 NOTE — Telephone Encounter (Signed)
 Copied from CRM [id number]. Topic: Referral - Question >> Apr 26, 2024  3:05 PM [name] wrote:  Reason for CRM: [facility] calling about a question about a referral for [name]. [name] [phone].

## 2024-04-27 ENCOUNTER — Telehealth: Payer: Self-pay | Admitting: Internal Medicine

## 2024-04-27 NOTE — Telephone Encounter (Signed)
-----   Message from [name] RAMAN sent at 04/25/2024  1:36 PM EDT ----- Regarding: RE: Psychology Good Afternoon  I called   [facility]  they try to contact patient  with  no luck . She told me to sent her a new referral   the fax is not working  so  I emailed to  [email or web address]  Attn  to  Ms Davia ----- Message ----- From: Vicci Barnie NOVAK, MD Sent: 04/14/2024   6:21 PM EDT To: [name] FORBES Adler Subject: Psychology                                     Can you please check to see if patient ever received appointment with [facility] since the referral was placed in March?

## 2024-05-01 ENCOUNTER — Other Ambulatory Visit: Payer: Self-pay | Admitting: Internal Medicine

## 2024-05-01 DIAGNOSIS — M818 Other osteoporosis without current pathological fracture: Secondary | ICD-10-CM

## 2024-05-03 ENCOUNTER — Ambulatory Visit: Payer: Medicare Other

## 2024-05-04 ENCOUNTER — Encounter: Payer: Self-pay | Admitting: Internal Medicine

## 2024-05-04 NOTE — Progress Notes (Signed)
 I received a letter from [facility] informing me that patient has been scheduled to see Dr. Authur on September 16 at 9 AM for intake and testing and again on October 1 at 11 AM for interview/feedback.  They are requesting patient's last 3 clinic notes, any brain images results and medication list be sent. I will have my CMA send the requested info.

## 2024-05-30 ENCOUNTER — Encounter: Payer: Self-pay | Admitting: Endocrinology

## 2024-05-30 ENCOUNTER — Ambulatory Visit (INDEPENDENT_AMBULATORY_CARE_PROVIDER_SITE_OTHER): Payer: Medicare Other | Admitting: Endocrinology

## 2024-05-30 VITALS — BP 112/62 | HR 67 | Resp 20 | Ht 63.0 in | Wt 139.2 lb

## 2024-05-30 DIAGNOSIS — M810 Age-related osteoporosis without current pathological fracture: Secondary | ICD-10-CM | POA: Diagnosis not present

## 2024-05-30 DIAGNOSIS — E559 Vitamin D deficiency, unspecified: Secondary | ICD-10-CM

## 2024-05-30 NOTE — Progress Notes (Unsigned)
 Outpatient Endocrinology Note Kimberly Rune Mendez, MD   Patient's Name: Kimberly [name]    DOB: 1956-10-03    MRN: [id number]  REASON OF VISIT: New consult for osteoporosis  REFERRING PROVIDER: Vicci Barnie NOVAK, MD  PCP: Vicci Barnie NOVAK, MD  HISTORY OF PRESENT ILLNESS:   Kimberly [name] is a 68 y.o. old female with past medical history listed below, is here for new consult of bone health issues / osteoporosis.  Pertinent Bone Health History: Patient was diagnosed with osteoporosis based on DEXA scan in December 2022, repeat DEXA scan in June 03, 2023 consistent with osteoporosis with lowest T-score of -3.5 at lumbar spine L1-L4, T-score of -2.9 at dual femoral total right and -2.8 at dual femoral total mean.  Patient is referred to endocrinology for evaluation and management of osteoporosis.  Patient was on tamoxifen  started in February 2020 after a diagnosis of breast cancer treated with left breast lumpectomy and adjuvant radiation therapy.  Patient was started on Fosamax  70 mg weekly in August 2024 after repeat DEXA scan consistent with no improvement on osteoporosis.   Bone Health Concerns:  Bisphosphonate use / other previous treatment-Tamoxifen  February 2020 and Fosamax  from August 2024.  Patient has been taking calcium and vitamin D  supplement.  She is non-smoker.  No excessive alcohol intake.  No chronic glucocorticoid use.  No anticonvulsant use.  No proton pump inhibitor use.  No fragility fracture.  No thyroid disorder/hypothyroidism or malabsorption or liver disease.  Fracture history: None.  Osteoporosis medications: Fosamax  restarted in August 2024.  Secondary workup for osteoporosis: Negative so far.  Calcium intake from supplements: Calcium/vitamin D  1 tablet daily. Dietary calcium intake: occasional ice cream, cheese.  Current vitamin D  intake: ? international units daily.  Relevant comorbidities: No history of bone cancer. H/o Breast cancer s/p  external  radiation treatment in 2020.   No history of diabetes mellitus.  No Rheumatoid arthritis. No history of GERD.  No history of recent invasive dental procedures. Not planning dental procedures in the near future.?  She may plan for dental procedure.  She has been periodically doing dental cleaning.  Imagings:  EXAM: Over 21, 2024 : Reviewed DUAL X-RAY ABSORPTIOMETRY (DXA) FOR BONE MINERAL DENSITY   IMPRESSION: Referring Physician:  BARNIE NOVAK VICCI Your patient completed a bone mineral density test using GE Lunar iDXA system (analysis version: 16). Technologist:     lmn PATIENT: Name: Kimberly [name], Kimberly [name] Patient ID: [id number] Birth Date: [birth year] Height: 62.5 in. Sex: Female Measured: 06/03/2023 Weight: 152.0 lbs. Indications: Atenolol , Breast Cancer History, Desyrel , Estrogen Deficient, Postmenopausal, Tamoxifen , Trazodone  Fractures: None Treatments: Multivitamin   ASSESSMENT: The BMD measured at AP Spine L1-L4 is 0.761 g/cm2 with a T-score of -3.5. This patient is considered osteoporotic according to World Health Organization [employer]) criteria.   The quality of the exam is good. Site Region Measured Date Measured Age YA BMD Significant CHANGE T-score AP Spine  L1-L4       06/03/2023    68.6         -3.5    0.761 g/cm2 AP Spine  L1-L4       09/14/2019    64.9         -3.5    0.765 g/cm2   DualFemur Total Right 06/03/2023 68.6 -2.9 0.645 g/cm2 * DualFemur Total Right 09/14/2019    64.9         -2.5    0.688 g/cm2   DualFemur Total Mean 06/03/2023 68.6 -2.8 0.656 g/cm2 *  DualFemur Total Mean  09/14/2019    64.9         -2.5    0.694 g/cm2   World Health Organization [facility]) criteria for post-menopausal, Caucasian Women: Normal       T-score at or above -1 SD Osteopenia   T-score between -1 and -2.5 SD Osteoporosis T-score at or below -2.5 SD   Interval history  Patient presented to for evaluation and management of osteoporosis.  She has been currently taking Fosamax  70 mg  weekly.  She has been taking calcium and vitamin D  supplements.    REVIEW OF SYSTEMS:  As per history of present illness.   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   BREAST LUMPECTOMY WITH RADIOACTIVE SEED LOCALIZATION Left 10/14/2018   Procedure: BREAST LUMPECTOMY WITH RADIOACTIVE SEED LOCALIZATION X2;  Surgeon: Vanderbilt Ned, MD;  Location: [facility] OR;  Service: General;  Laterality: Left;   CHOLECYSTECTOMY     COLONOSCOPY WITH PROPOFOL  N/A 08/05/2018   Procedure: COLONOSCOPY WITH PROPOFOL ;  Surgeon: Shaaron Lamar HERO, MD;  Location: [facility];  Service: Endoscopy;  Laterality: N/A;  12:45pm   TONSILLECTOMY      ALLERGIES: Allergies  Allergen Reactions   Codeine Other (See Comments)    Cold sweats    FAMILY HISTORY:  Family History  Problem Relation Age of Onset   Breast cancer Mother 63   Cancer Maternal Grandmother        thinks it was braest, but not sure what type of cancer, dx >50   Heart disease Paternal Grandfather    Lung cancer Maternal Aunt    Colon cancer Neg Hx     SOCIAL HISTORY: Social History   Socioeconomic History   Marital status: Single    Spouse name: Not on file   Number of children: 2   Years of education: Not on file   Highest education level: 12th grade  Occupational History   Not on file  Tobacco Use   Smoking status: Never    Passive exposure: Never   Smokeless tobacco: Never   Vaping Use   Vaping status: Never Used  Substance and Sexual Activity   Alcohol use: No    Alcohol/week: 0.0 standard drinks of alcohol   Drug use: No   Sexual activity: Not Currently    Birth control/protection: Post-menopausal  Other Topics Concern   Not on file  Social History Narrative   Lives with daughter, 5 grandchildren   Social Drivers of Health   Financial Resource Strain: Low Risk  (12/14/2023)   Overall Financial Resource Strain (CARDIA)    Difficulty of Paying Living Expenses: Not hard at all  Food Insecurity: Food Insecurity Present (12/14/2023)   Hunger Vital Sign    Worried About Programme researcher, broadcasting/film/video in  the Last Year: Never true    Ran Out of Food in the Last Year: Often true  Transportation Needs: No Transportation Needs (12/14/2023)   PRAPARE - Administrator, Civil Service (Medical): No    Lack of Transportation (Non-Medical): No  Physical Activity: Insufficiently Active (12/14/2023)   Exercise Vital Sign    Days of Exercise per Week: 2 days    Minutes of Exercise per Session: 20 min  Stress: Stress Concern Present (12/14/2023)   [employer] of Occupational Health - Occupational Stress Questionnaire    Feeling of Stress : To some extent  Social Connections: Moderately Integrated (12/14/2023)   Social Connection and Isolation Panel    Frequency of Communication with Friends and Family: More than three times a week    Frequency of Social Gatherings with Friends and Family: More than three times a week    Attends Religious Services: More than 4 times per year    Active Member of [employer] or Organizations: Yes    Attends Engineer, structural: More than 4 times per year    Marital Status: Never married    MEDICATIONS:  Current Outpatient Medications  Medication Sig Dispense Refill   alendronate  (FOSAMAX ) 70 MG tablet TAKE 1 TABLET BY MOUTH ONCE A WEEK FIRST THING EVERY MORNING ON AN EMPTY STOMACH AT LEAST 30 MINUTES BEFORE TAKING OTHER MEDS. 4  tablet 5   amLODipine  (NORVASC ) 10 MG tablet TAKE 1 TABLET(10 MG) BY MOUTH DAILY 90 tablet 1   atenolol  (TENORMIN ) 25 MG tablet TAKE 1 TABLET(25 MG) BY MOUTH DAILY 90 tablet 1   Blood Pressure Monitor DEVI Use as directed to check home blood pressure 2-3 times a week 1 Device 0   busPIRone  (BUSPAR ) 15 MG tablet Take 1 tablet (15 mg total) by mouth 2 (two) times daily. 180 tablet 1   fluticasone  (FLONASE ) 50 MCG/ACT nasal spray Place 1 spray into both nostrils daily as needed for allergies or rhinitis. 16 g 6   Multiple Vitamin (MULTIVITAMIN WITH MINERALS) TABS tablet Take 1 tablet by mouth daily.      traZODone  (DESYREL ) 50 MG tablet TAKE 1 AND 1/2 TABLETS BY MOUTH EVERY NIGHT AT BEDTIME 135 tablet 1   trihexyphenidyl (ARTANE) 2 MG tablet Take by mouth.     No current facility-administered medications for this visit.    PHYSICAL EXAM: Vitals:   05/30/24 1312  BP: 112/62  Pulse: 67  Resp: 20  SpO2: 97%  Weight: 139 lb 3.2 oz (63.1 kg)  Height: 5' 3 (1.6 m)    Body mass index is 24.66 kg/m.  Wt Readings from Last 3 Encounters:  05/30/24 139 lb 3.2 oz (63.1 kg)  04/14/24 144 lb (65.3 kg)  12/14/23 152 lb (68.9 kg)    General: Well developed, well nourished female in no apparent distress.  HEENT: AT/[place], no external lesions. Hearing intact to the spoken word Eyes: EOMI. Conjunctiva clear and no icterus. Neck: Trachea midline, neck supple without appreciable thyromegaly or lymphadenopathy and no palpable thyroid nodules Lungs: Clear to auscultation, no wheeze. Respirations not labored Heart: S1S2, Regular in rate and rhythm.  Abdomen: Soft, non tender, non distended Neurologic: Alert, oriented, normal speech, deep tendon biceps reflexes normal,  no gross focal neurological deficit Extremities: No pedal pitting edema, no tremors of outstretched hands. No spine tenderness Skin: Warm, color good.  Psychiatric: Does not appear depressed or anxious  PERTINENT HISTORIC LABORATORY AND  IMAGING STUDIES:  All pertinent laboratory results were reviewed.  Please see HPI also for further details.   Lab Results  Component Value Date   ALKPHOS 68 12/01/2022   ALKPHOS 67 11/25/2021   ALKPHOS 64 11/22/2020    ASSESSMENT / PLAN  1. Age-related osteoporosis without current pathological fracture   2. Vitamin D  deficiency      Kimberly [name] is a 68 y.o. old female with osteoporosis based on DXA scan results in December 2020 and August 2024, lowest T-score of -3.5 at lumbar spine. Patient has multiple risk factors for osteoporosis, including age, postmenopausal  status.  Patient has been on tamoxifen  from February 2020.  She has been on Fosamax  70 mg weekly from August 2024.  Plan: - Due to severe osteoporosis with T-score of -3.5, I recommended and discussed injectable antiresorptive therapy Reclast annually.  Patient does not prefer to be on injectable medication.  She wants to continue on oral current medication Fosamax . -She reported that she may plan for dental procedures in the future however no specific plan at this time and reportedly no recommendation by dentist as well. -Will continue Fosamax  70 mg weekly. -Advised patient to take calcium/vitamin D  1 tablet 2 times a day. -Will check BMP with EGFR, albumin, PTH, protein electrophoresis for workup for osteoporosis. -Regular weight bearing exercises recommended as tolerated by the patient.  Fall precautions discussed.   Diagnoses and all orders for this visit:  Age-related osteoporosis without current pathological fracture -     Renal function panel -     VITAMIN D  25 Hydroxy (Vit-D Deficiency, Fractures) -     Parathyroid  hormone, intact (no Ca)  Vitamin D  deficiency -     VITAMIN D  25 Hydroxy (Vit-D Deficiency, Fractures)     DISPOSITION Follow up in clinic in 6 months suggested.  All questions answered and patient verbalized understanding of the plan.  Kimberly Starlet Gallentine, MD [facility] Endocrinology [facility] [address], [address] [place], [place] [address] Phone # [phone]  At least part of this note was generated using voice recognition software. Inadvertent word errors may have occurred, which were not recognized during the proofreading process.

## 2024-05-31 ENCOUNTER — Encounter: Payer: Self-pay | Admitting: Endocrinology

## 2024-05-31 ENCOUNTER — Ambulatory Visit: Payer: Self-pay | Admitting: Endocrinology

## 2024-05-31 LAB — RENAL FUNCTION PANEL
Albumin: 4.4 g/dL (ref 3.6–5.1)
BUN: 10 mg/dL (ref 7–25)
CO2: 25 mmol/L (ref 20–32)
Calcium: 10.1 mg/dL (ref 8.6–10.4)
Chloride: 104 mmol/L (ref 98–110)
Creat: 0.75 mg/dL (ref 0.50–1.05)
Glucose, Bld: 91 mg/dL (ref 65–99)
Phosphorus: 3.7 mg/dL (ref 2.1–4.3)
Potassium: 4.3 mmol/L (ref 3.5–5.3)
Sodium: 139 mmol/L (ref 135–146)

## 2024-05-31 LAB — VITAMIN D 25 HYDROXY (VIT D DEFICIENCY, FRACTURES): Vit D, 25-Hydroxy: 51 ng/mL (ref 30–100)

## 2024-05-31 LAB — PARATHYROID HORMONE, INTACT (NO CA): PTH: 57 pg/mL (ref 16–77)

## 2024-07-04 ENCOUNTER — Other Ambulatory Visit: Payer: Self-pay | Admitting: Internal Medicine

## 2024-07-04 DIAGNOSIS — F320 Major depressive disorder, single episode, mild: Secondary | ICD-10-CM

## 2024-07-05 NOTE — Telephone Encounter (Signed)
 Requested Prescriptions  Pending Prescriptions Disp Refills   traZODone  (DESYREL ) 50 MG tablet [Pharmacy Med Name: TRAZODONE  50MG  TABLETS] 135 tablet 1    Sig: TAKE 1 AND 1/2 TABLETS BY MOUTH EVERY NIGHT AT BEDTIME     Psychiatry: Antidepressants - Serotonin Modulator Passed - 07/05/2024 11:34 AM      Passed - Completed PHQ-2 or PHQ-9 in the last 360 days      Passed - Valid encounter within last 6 months    Recent Outpatient Visits           2 months ago Essential hypertension   [facility] [place] Vicci Barnie NOVAK, MD   6 months ago Essential hypertension   [facility] [place] [facility] [place]. [facility] Vicci Barnie NOVAK, MD   10 months ago Essential hypertension   [facility] [facility] [place] [facility] Of [place]. [facility] Vicci Barnie NOVAK, MD   1 year ago Essential hypertension   [facility] [facility] [place] [facility] Dept Of [place]. [facility] Vicci Barnie NOVAK, MD   1 year ago Essential hypertension   [place] [facility] [place] [facility] Dept Of [place]. [facility] Vicci Barnie NOVAK, MD       Future Appointments             In 1 month Vicci Barnie NOVAK, MD [facility] Of [place]. [facility] [place]

## 2024-07-26 ENCOUNTER — Ambulatory Visit

## 2024-07-26 DIAGNOSIS — Z23 Encounter for immunization: Secondary | ICD-10-CM

## 2024-08-18 ENCOUNTER — Ambulatory Visit: Attending: Internal Medicine | Admitting: Internal Medicine

## 2024-08-18 VITALS — BP 122/69 | HR 78 | Temp 98.2°F | Ht 63.0 in | Wt 145.0 lb

## 2024-08-18 DIAGNOSIS — I10 Essential (primary) hypertension: Secondary | ICD-10-CM | POA: Insufficient documentation

## 2024-08-18 DIAGNOSIS — M818 Other osteoporosis without current pathological fracture: Secondary | ICD-10-CM | POA: Insufficient documentation

## 2024-08-18 DIAGNOSIS — R4189 Other symptoms and signs involving cognitive functions and awareness: Secondary | ICD-10-CM | POA: Insufficient documentation

## 2024-08-18 DIAGNOSIS — R7303 Prediabetes: Secondary | ICD-10-CM | POA: Diagnosis not present

## 2024-08-18 DIAGNOSIS — Z79899 Other long term (current) drug therapy: Secondary | ICD-10-CM | POA: Diagnosis not present

## 2024-08-18 DIAGNOSIS — G4700 Insomnia, unspecified: Secondary | ICD-10-CM | POA: Insufficient documentation

## 2024-08-18 DIAGNOSIS — F419 Anxiety disorder, unspecified: Secondary | ICD-10-CM | POA: Diagnosis not present

## 2024-08-18 DIAGNOSIS — G250 Essential tremor: Secondary | ICD-10-CM | POA: Insufficient documentation

## 2024-08-18 DIAGNOSIS — R413 Other amnesia: Secondary | ICD-10-CM | POA: Diagnosis not present

## 2024-08-18 DIAGNOSIS — G259 Extrapyramidal and movement disorder, unspecified: Secondary | ICD-10-CM | POA: Diagnosis not present

## 2024-08-18 MED ORDER — AMLODIPINE BESYLATE 10 MG PO TABS: ORAL_TABLET | ORAL | 1 refills | Status: AC

## 2024-08-18 MED ORDER — ALENDRONATE SODIUM 70 MG PO TABS
ORAL_TABLET | ORAL | 5 refills | Status: AC
Start: 2024-08-18 — End: ?

## 2024-08-18 NOTE — Patient Instructions (Signed)
 You have a follow up appointment with [facility] on 09/14/2024 at 2:10 p.m for the movement disorder. [phone]. [facility].   VISIT SUMMARY: Today, we reviewed your blood pressure, osteoporosis, and tremors. We also discussed your recent memory testing and follow-up plans with your neurologist. You received your flu shot recently and are due for some routine lab tests.  YOUR PLAN: -ESSENTIAL HYPERTENSION: Your blood pressure is well-controlled with your current medications, atenolol  and amlodipine . Please continue taking these medications as prescribed.  -OSTEOPOROSIS: Osteoporosis is a condition where bones become weak and brittle. You had paused alendronate  due to dental issues, but you should now resume taking it once a week. Continue taking your calcium plus vitamin D  supplements.  - MOVEMENT DISORDER: Continue the Artane as prescribed by [facility]. Keep your appointment with them on 09/14/2024. Try to take an adult family member with you to the appointment.  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 This will help us  understand your memory issues better. If you do not receive the results in 1-2 weeks, please request them.  -GENERAL HEALTH MAINTENANCE: You have received your flu shot. You are due for routine lab tests to check your cholesterol, kidney and liver function, and blood count. Please complete these lab tests.  INSTRUCTIONS: Please attend your neurology follow-up on December 3rd. If you do not receive the results of your memory evaluation in 1-2 weeks, request them. Complete your routine lab tests for cholesterol, kidney and liver function, and blood count.                      Contains text generated by [employer]bridge.                                 Contains text generated by Abridge.

## 2024-08-18 NOTE — Progress Notes (Signed)
 Patient ID: Kimberly [name], female    DOB: 10-09-1956  MRN: [id number]  CC: Hypertension (HTN. Med refills. Suellen stopping Fosamax  Wyn received flu vax. No shingles vax)   Subjective: Kimberly [name] is a 68 y.o. female who presents for chronic ds management. 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   Discussed the use of AI scribe software for clinical note transcription with the patient, who gave verbal consent to proceed.  History of Present Illness Kimberly [name] is a 68 year old female with hypertension, osteoporosis, and a history of breast cancer who presents for follow-up on her blood pressure, osteoporosis, and tremors. She has her meds with her.  She has been off alendronate  for a month due to dental issues that have since resolved.. She takes calcium plus vitamin D  (Caltrate 600+800 international units  daily for osteoporosis. Said endo Dr. Mercie 05/2024. Also showed me appt in her phone for appt with endo at [facility] later this month. She wonders whether she needs to keep this appt or not.  Her history of breast cancer includes treatment with tamoxifen  for five years, which was discontinued in February by Dr. Odean. She received an unexpected refill from [facility], which she has not taken.  She also takes Buspar  and trazodone , for anxiety and insomnia and Artane 2 mg two tabs BID for her movement disorder. I was able to find her next appt with [facility] neurology on Care Everywhere. Scheduled for 09/14/24 at 2:20 p.m.   On last visit, I expressed concern about her declining memory and encouraged her to bring a family member with her to appts. She said her grand-daughter is interested and helps her but was unclear why she did not accompany her today.  I referred her to [facility] for neuropsychiatric eval. She had a hard time  remembering if she kept this appt or not. She was eventually able to find the appt on her phone. She had two visit with them, most recent one was yesterday. She told me the initial testing took 4 hrs.  She received her flu shot from a retired engineer, civil (consulting) at her church.     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 03/22/2021   Prediabetes 04/19/2020   Muscle cramps 04/19/2020   Unintended weight loss 04/19/2020   History of rheumatic fever as a child 03/17/2019   Genetic testing 10/25/2018   Family history of breast cancer    Family history of lung cancer    Ductal carcinoma in situ (DCIS) of left breast 09/07/2018   Intention tremor 10/15/2017   Diabetes mellitus screening 06/10/2017   Breast nodule 03/02/2016   Seasonal allergies 01/07/2016   Right shoulder pain 09/28/2015   Chronic insomnia 07/06/2015   Tinea pedis 07/06/2015   Nocturia 08/25/2014   Chronic anxiety 08/25/2014   Heme positive stool 08/16/2014   HTN (hypertension) 08/16/2014     Current Outpatient Medications on File Prior to Visit  Medication Sig Dispense Refill   atenolol  (TENORMIN ) 25 MG tablet TAKE 1 TABLET(25 MG) BY MOUTH DAILY 90 tablet 1   Blood Pressure Monitor DEVI Use as directed to check home blood pressure 2-3 times a week 1 Device 0  busPIRone  (BUSPAR ) 15 MG tablet Take 1 tablet (15 mg total) by mouth 2 (two) times daily. 180 tablet 1   fluticasone  (FLONASE ) 50 MCG/ACT nasal spray Place 1 spray into both nostrils daily as needed for allergies or rhinitis. 16 g 6   traZODone  (DESYREL ) 50 MG tablet TAKE 1 AND 1/2 TABLETS BY MOUTH EVERY NIGHT AT BEDTIME 135 tablet 1   trihexyphenidyl (ARTANE) 2 MG tablet Take by mouth.     Multiple Vitamin (MULTIVITAMIN WITH MINERALS) TABS tablet Take 1 tablet by mouth daily.      No current  facility-administered medications on file prior to visit.    Allergies  Allergen Reactions   Codeine Other (See Comments)    Cold sweats    Social History   Socioeconomic History   Marital status: Single    Spouse name: Not on file   Number of children: 2   Years of education: Not on file   Highest education level: 12th grade  Occupational History   Not on file  Tobacco Use   Smoking status: Never    Passive exposure: Never   Smokeless tobacco: Never  Vaping Use   Vaping status: Never Used  Substance and Sexual Activity   Alcohol use: No    Alcohol/week: 0.0 standard drinks of alcohol   Drug use: No   Sexual activity: Not Currently    Birth control/protection: Post-menopausal  Other Topics Concern   Not on file  Social History Narrative   Lives with daughter, 5 grandchildren   Social Drivers of Health   Financial Resource Strain: Low Risk  (12/14/2023)   Overall Financial Resource Strain (CARDIA)    Difficulty of Paying Living Expenses: Not hard at all  Food Insecurity: Food Insecurity Present (12/14/2023)   Hunger Vital Sign    Worried About Running Out of Food in the Last Year: Never true    Ran Out of Food in the Last Year: Often true  Transportation Needs: No Transportation Needs (12/14/2023)   PRAPARE - Administrator, Civil Service (Medical): No    Lack of Transportation (Non-Medical): No  Physical Activity: Insufficiently Active (12/14/2023)   Exercise Vital Sign    Days of Exercise per Week: 2 days    Minutes of Exercise per Session: 20 min  Stress: Stress Concern Present (12/14/2023)   [employer] of Occupational Health - Occupational Stress Questionnaire    Feeling of Stress : To some extent  Social Connections: Moderately Integrated (12/14/2023)   Social Connection and Isolation Panel    Frequency of Communication with Friends and Family: More than three times a week    Frequency of Social Gatherings with Friends and Family: More than three  times a week    Attends Religious Services: More than 4 times per year    Active Member of [employer] or Organizations: Yes    Attends Engineer, Structural: More than 4 times per year    Marital Status: Never married  Intimate Partner Violence: Not At Risk (12/14/2023)   Humiliation, Afraid, Rape, and Kick questionnaire    Fear of Current or Ex-Partner: No    Emotionally Abused: No    Physically Abused: No    Sexually Abused: No    Family History  Problem Relation Age of Onset   Breast cancer Mother 25   Cancer Maternal Grandmother        thinks it was braest, but not sure what type of cancer, dx >50   Heart disease Paternal  Grandfather    Lung cancer Maternal Aunt    Colon cancer Neg Hx     Past Surgical History:  Procedure Laterality Date   APPENDECTOMY     bowel obstruction     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   BREAST LUMPECTOMY WITH RADIOACTIVE SEED LOCALIZATION Left 10/14/2018   Procedure: BREAST LUMPECTOMY WITH RADIOACTIVE SEED LOCALIZATION X2;  Surgeon: Vanderbilt Ned, MD;  Location: [facility] OR;  Service: General;  Laterality: Left;   CHOLECYSTECTOMY     COLONOSCOPY WITH PROPOFOL  N/A 08/05/2018   Procedure: COLONOSCOPY WITH PROPOFOL ;  Surgeon: Shaaron Lamar HERO, MD;  Location: [facility];  Service: Endoscopy;  Laterality: N/A;  12:45pm   TONSILLECTOMY      ROS: Review of Systems Negative except as stated above  PHYSICAL EXAM: BP 122/69 (BP Location: Left Arm, Patient Position: Sitting, Cuff Size: Normal)   Pulse 78   Temp 98.2 F (36.8 C) (Oral)   Ht 5' 3 (1.6 m)   Wt 145 lb (65.8 kg)   SpO2 97%   BMI 25.69 kg/m   Wt Readings from Last 3 Encounters:  08/18/24 145 lb (65.8 kg)  05/30/24 139 lb 3.2 oz (63.1 kg)  04/14/24 144 lb (65.3 kg)    Physical Exam  General appearance - alert, well appearing, elderly AAF  and in no distress Mental status - pt continues to show increasing difficulty with her memory Mouth - mouth appears slightly dry Chest  - clear to auscultation, no wheezes, rales or rhonchi, symmetric air entry Heart - normal rate, regular rhythm, normal S1, S2, no murmurs, rubs, clicks or gallops Neurological -patient has increased involuntary movement of the head, neck and smacking of the lips.  She is also fidgeting in her chair.      Latest Ref Rng & Units 08/18/2024    4:46 PM 05/30/2024   12:00 AM 11/30/2023    3:06 PM  CMP  Glucose 70 - 99 mg/dL 84  91  81   BUN 8 - 27 mg/dL 12  10  15    Creatinine 0.57 - 1.00 mg/dL 9.21  9.24  9.25   Sodium 134 - 144 mmol/L 140  139  140   Potassium 3.5 - 5.2 mmol/L 3.5  4.3  4.4   Chloride 96 - 106 mmol/L 103  104  106   CO2 20 - 29 mmol/L 24  25  26    Calcium 8.7 - 10.3 mg/dL 89.8  89.8  9.6   Total Protein 6.0 - 8.5 g/dL 7.3   7.5   Total Bilirubin 0.0 - 1.2 mg/dL 0.3     Alkaline Phos 49 - 135 IU/L 55     AST 0 - 40 IU/L 19     ALT 0 - 32 IU/L 13      Lipid Panel     Component Value Date/Time   CHOL 174 08/18/2024 1646   TRIG 80 08/18/2024 1646   HDL 73 08/18/2024 1646   CHOLHDL 2.4 08/18/2024 1646   CHOLHDL 2.7 11/13/2016 1102   VLDL 25 11/13/2016 1102   LDLCALC 86 08/18/2024 1646    CBC    Component Value Date/Time   WBC 8.7 08/18/2024 1646   WBC 6.1 10/08/2018 0919   RBC 4.30 08/18/2024 1646   RBC 4.84 10/08/2018 0919   HGB 13.2 08/18/2024 1646   HCT 39.8 08/18/2024 1646   PLT 425 08/18/2024 1646   MCV 93 08/18/2024 1646   MCH 30.7 08/18/2024 1646  MCH 30.4 10/08/2018 0919   MCHC 33.2 08/18/2024 1646   MCHC 32.7 10/08/2018 0919   RDW 12.8 08/18/2024 1646   LYMPHSABS 1.9 10/08/2018 0919   MONOABS 0.6 10/08/2018 0919   EOSABS 0.2 10/08/2018 0919   BASOSABS 0.1 10/08/2018 0919    ASSESSMENT AND PLAN: 1. Essential hypertension (Primary) At goal. Continue Norvasc  and Atenolol  - amLODipine  (NORVASC ) 10 MG tablet; TAKE 1 TABLET(10 MG) BY MOUTH DAILY  Dispense: 90 tablet; Refill: 1 - CBC - Lipid panel - Comprehensive metabolic panel with GFR  2.  Other osteoporosis without current pathological fracture Continue Caltrate that she is already taking Resume Fosamax  - alendronate  (FOSAMAX ) 70 MG tablet; TAKE 1 TABLET BY MOUTH ONCE A WEEK FIRST THING EVERY MORNING ON AN EMPTY STOMACH AT LEAST 30 MINUTES BEFORE TAKING OTHER MEDS.  Dispense: 4 tablet; Refill: 5  3. Movement disorder I have not seen a significant improvement on the Artane. However she will continue the med and keep f/u appt with [facility] neurology in December. Encouraged her to take a responsible family member with her.  4. Memory changes Cognitive impairment, possible dementia Memory evaluation conducted. Awaiting results for further management.   Patient was given the opportunity to ask questions.  Patient verbalized understanding of the plan and was able to repeat key elements of the plan.   This documentation was completed using Paediatric nurse.  Any transcriptional errors are unintentional.  Orders Placed This Encounter  Procedures   CBC   Lipid panel   Comprehensive metabolic panel with GFR     Requested Prescriptions   Signed Prescriptions Disp Refills   amLODipine  (NORVASC ) 10 MG tablet 90 tablet 1    Sig: TAKE 1 TABLET(10 MG) BY MOUTH DAILY   alendronate  (FOSAMAX ) 70 MG tablet 4 tablet 5    Sig: TAKE 1 TABLET BY MOUTH ONCE A WEEK FIRST THING EVERY MORNING ON AN EMPTY STOMACH AT LEAST 30 MINUTES BEFORE TAKING OTHER MEDS.    Return in about 4 months (around 12/16/2024).  Barnie Louder, MD, FACP

## 2024-08-19 ENCOUNTER — Ambulatory Visit: Payer: Self-pay | Admitting: Internal Medicine

## 2024-08-19 LAB — LIPID PANEL
Chol/HDL Ratio: 2.4 ratio (ref 0.0–4.4)
Cholesterol, Total: 174 mg/dL (ref 100–199)
HDL: 73 mg/dL (ref >39)
LDL Chol Calc (NIH): 86 mg/dL (ref 0–99)
Triglycerides: 80 mg/dL (ref 0–149)
VLDL Cholesterol Cal: 15 mg/dL (ref 5–40)

## 2024-08-19 LAB — COMPREHENSIVE METABOLIC PANEL WITH GFR
ALT: 13 IU/L (ref 0–32)
AST: 19 IU/L (ref 0–40)
Albumin: 4.3 g/dL (ref 3.9–4.9)
Alkaline Phosphatase: 55 IU/L (ref 49–135)
BUN/Creatinine Ratio: 15 (ref 12–28)
BUN: 12 mg/dL (ref 8–27)
Bilirubin Total: 0.3 mg/dL (ref 0.0–1.2)
CO2: 24 mmol/L (ref 20–29)
Calcium: 10.1 mg/dL (ref 8.7–10.3)
Chloride: 103 mmol/L (ref 96–106)
Creatinine, Ser: 0.78 mg/dL (ref 0.57–1.00)
Globulin, Total: 3 g/dL (ref 1.5–4.5)
Glucose: 84 mg/dL (ref 70–99)
Potassium: 3.5 mmol/L (ref 3.5–5.2)
Sodium: 140 mmol/L (ref 134–144)
Total Protein: 7.3 g/dL (ref 6.0–8.5)
eGFR: 82 mL/min/1.73 (ref >59)

## 2024-08-19 LAB — CBC
Hematocrit: 39.8 % (ref 34.0–46.6)
Hemoglobin: 13.2 g/dL (ref 11.1–15.9)
MCH: 30.7 pg (ref 26.6–33.0)
MCHC: 33.2 g/dL (ref 31.5–35.7)
MCV: 93 fL (ref 79–97)
Platelets: 425 x10E3/uL (ref 150–450)
RBC: 4.3 x10E6/uL (ref 3.77–5.28)
RDW: 12.8 % (ref 11.7–15.4)
WBC: 8.7 x10E3/uL (ref 3.4–10.8)

## 2024-08-20 ENCOUNTER — Encounter: Payer: Self-pay | Admitting: Internal Medicine

## 2024-08-22 ENCOUNTER — Other Ambulatory Visit: Payer: Self-pay | Admitting: Internal Medicine

## 2024-08-24 ENCOUNTER — Telehealth: Payer: Self-pay | Admitting: Internal Medicine

## 2024-08-24 NOTE — Telephone Encounter (Signed)
 Called [facility] and spoke to Fairlee. She stated that she will message the intake coordinator to get an update and fax over the report if it is ready. Awaiting fax.

## 2024-08-24 NOTE — Telephone Encounter (Signed)
 Please call [facility] [phone] and inquire whether her neuropsychiatric report is ready as yet. We had referred her but I have not received results/report.

## 2024-08-31 NOTE — Telephone Encounter (Signed)
 Called [facility] & spoke to Kimberly [name]. She stated that it was faxed on 08/17/2024. Requested for the neuropsychiatric report to be re-faxed due to it not being received. [name] confirmed and will re-fax the report. Awaiting fax.

## 2024-09-02 ENCOUNTER — Telehealth: Payer: Self-pay | Admitting: Internal Medicine

## 2024-09-02 NOTE — Telephone Encounter (Signed)
 PC placed to PA Twana Seip at [facility] neurology. She has seen pt last 2 times she was seen. Advised her that Artane has not helped with pt's tremors/dystonia. I have been concerned about pt's declining memory and potential association with her movement disorder. Informed her of the results of her recent neuropsychiatric eval that was done. Pt has appt 09/14/24 with her supervising physician Dr. Chere Hans. I would like for her to inform Dr. Hans about my concern and will fax a copy of the neuropsychiatric report to her and Dr. Shawna.   Clarisa: please look under media and fax a copy of the neuropsychiatric report received yesterday to [facility] neurology out-pt clinic Attention Dr. Shawna and PA Twana Seip. I have written a cover letter to go along with the report. Their fax # is [phone]. Thanks

## 2024-09-02 NOTE — Telephone Encounter (Signed)
 I received the report. Please call pt and schedule appt for her to see me to go over results of the neuropsychiatric eval that was done. Please Bring a family member with her.

## 2024-09-05 NOTE — Telephone Encounter (Signed)
 Noted! Thank you

## 2024-09-05 NOTE — Telephone Encounter (Signed)
 Letter and neuropsychiatric report was successfully faxed to [facility] on 09/05/24. Fax number: [phone].

## 2024-09-14 NOTE — Progress Notes (Signed)
 [facility] Department of Neurology Division of Movement Disorders Return Patient Visit   Date of Visit: Wed 09/14/2024   Reason for Visit: Tremors  Brief Patient History:  Kimberly [name] is a 68 y.o. right-handed female with anxiety, insomnia, HTN, breast mass  (DCIS) s/p radiation and on Tamoxifen   who presents for follow up of tremors. Accompanied by her friend who assists with history gathering.  She reports progressive arm and head tremors over the past since around 2017-18, changes to handwriting such that it is sloppy, changes to voice that sometimes it is hard to understand speech and voice is choppy but no vocal tremors, and mild gait issues. She has no known family history of tremors or neurologic disease. On initial exam, she had normal tone, no bradykinesia and primarily intention tremor, milder action tremor, no resting tremor, minimal tremor intrusion on spiral drawing and mild on handwriting sample. However she had a prominent primarily yes yes head tremor and a subtle left laterocollis and anterior sagittal shift/antecollis. She had intermittent choppy dysarthria but no vocal tremor.   Interval History: LOV with me was 07/2022 at which time we continued on Artane 2 mg 1 tab twice daily and I recommended melatonin to help with sleep.  I also provided a referral for psychology for her to receive therapy for anxiety.  Since then she saw PA Green in April 2024 and then in April 2025.  At her last visit with PA Green the patient reported that she was no longer taking Artane for unclear reasons and she had felt that her head dystonia had worsened.  She continued to report issues with sleep.  Again melatonin was recommended, and Artane was restarted with plans to increase to 2 tabs twice daily.  Since then her PCP contacted PA Green to report concerns about the patient's cognition.  Today patient states she is not sure if the Artane helps her.  Instead she has been  noted to have new lip and tongue movements as well as general fidgeting.  These movements do not affect her ability to talk or eat.  She is no longer bothered by her tremors as much.  She denies any changes to drooling or swallowing.  No falls.  She continues to have sleep issues.  She goes to bed at 8 PM and wakes up at 1 AM and cannot fall back asleep.  She feels rested when she wakes up.  She takes trazodone  75 mg nightly which does not help.  She is taking buspirone  15 mg twice daily with the last dose at bedtime.  She still has not restarted melatonin.    GENERAL REVIEW OF SYSTEMS: As stated above  PAST MEDICAL HISTORY:  Medical History[1]   PAST SURGICAL HISTORY:  Surgical History[2]   CURRENT MEDICATIONS:   Medications Ordered Prior to Encounter[3]   ALLERGIES: Allergies[4]  SOCIAL HISTORY:   Social History[5]   FAMILY HISTORY:  Family History[6]   PHYSICAL EXAM:  Vitals:   09/14/24 1440  BP: 111/72  BP Location: Left arm  Patient Position: Sitting  Pulse: 71  Temp: 97.3 F (36.3 C)  TempSrc: Temporal  Weight: 64.1 kg (141 lb 6.4 oz)    General: Well appearing, sitting in chair in no acute distress Cardiovascular: Normal rate  Respiratory: Normal work of breathing.   Neurologic examination:  Mental status: Awake, alert and oriented.  Attention grossly intact.  Recent memory impaired.   Speech: Fluent, moderate dysarthria.   Cranial nerves:  EOMI  without nystagmus, symmetric facial expressions, dyskinetic movements of her lips and tongue, occasional blepharospasm   Motor:  Strength 5/5 in upper and lower extremities proximally and distally, bilaterally except for mild leg weakness but this may have been due to dyskinetic intrusions. Bulk is normal.    Movement:  Hypophonia: None Hypomimia: None Tone: Paratonia with increase tone in arms and neck Bradykinesia: none Tremor: Minimal head or limb tremors noted today since the dyskinesia was the  predominant phenomenology.  Rarely she would have a yes yes head tremor.   Additional movements: slight left laterocollis and anterior sagittal shift with hunched over shoulder/upper back. Left> right fifth digit is abducted greater than expected; constant orofacial lingual dyskinetic movements, occasional leg dyskinetic movements and truncal dyskinesias.   Sensation: Previously, Intact to light touch, vibration, and pinprick symmetrically with slight reduction to vibration in left toe by 10-20%    Reflexes:  Previously, 2+ in biceps (slightly more brisk on left, brachioradialis, triceps, patellae and Achilles bilaterally. Toes downgoing bilaterally. No ankle clonus. Weakly present L Hoffman   Coordination: Finger-nose-finger: no dysmetria  Rapid alternating movements: no incoordination    Gait and Stance: Hunched posture, normal base, normal stride, mildly reduced arm-swing, normal turning.      DATA:  As documented above. Additionally: MRI brain with and without contrast 03/2018 1. 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 It is next to the head of the right caudate but does not appear to involve the basal ganglia. 2. Scattered foci in the subcortical and deep white matter of the hemispheres consistent with chronic microvascular ischemic changes. None of the foci appear to be acute. 3. There is asymmetry of the pituitary gland. The gland appears to enhance homogenously. The pituitary stalk is midline. This could be an incidental finding or due to a microadenoma. If clinically indicated, consider dedicated pituitary imaging. 4. Chronic ethmoid and right frontal sinusitis. 5. There are no acute findings.  05/2024 PTH within normal limits Normal RFP and vitamin D    ASSESSMENT AND PLAN: 1. Spasmodic torticollis   2. Chronic insomnia   3. Drug-induced dyskinesia    [name] is a 68 y.o. right-handed female with anxiety,  insomnia, HTN, breast mass  (DCIS) s/p radiation and on Tamoxifen   who presents for follow up of tremors and new involuntary movements.  Her head and arm tremors are markedly improved since starting Artane.  She was doing well on 2 mg twice daily, but somehow the dose was discontinued and due to reports of worsening it appears that the dose was then further escalated to 4 mg twice daily.  Since then it appears she has developed generalized dyskinesias especially involving the face.  This has been really reported in select cases.  Thus I recommend reducing the Artane back down to 2 mg twice daily.  If the symptoms persist, we will have to taper off the Artane.  We can consider amantadine as a potential replacement in the future.  Her PCP had concerns about the patient's cognition.  I did not feel that her cognition was dramatically worse than when I met her previously, but it is possible that the Artane was exacerbating any cognitive issues as well.    They have repeatedly recommended she restart melatonin to help with her sleep but it is unclear why she has not yet followed up with this.  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  She has not been exposed to any other neuroleptic medications from her report to my review of her medications.  Plan will be to reduce the Artane and taper off if needed.  May consider amantadine to help with dyskinesias as well as dystonia/tremors. -If dyskinesias do not improve or new symptoms arise may consider additional metabolic testing such as copper and ceruloplasmin [email or web address] https://www.e-jmd.org/journal/view.php?number=63   Insomnia - Recommend Melatonin 3-5mg  ER at night with current sleep regimen   Patient to report back in 1 month of reducing Artane.  We will follow-up with her at next available  in about 4 to 6 months with me or PA Green  I have personally spent 30 minutes involved in face-to-face and non-face-to-face activities for this patient on the day of the visit.  Professional time spent includes the following activities, in addition to those noted in the documentation: care coordination, review of labwork and imaging as indicated in note, review of records, and greater than 50% of this visit was spent in counseling the patient regarding above.   Cinderella Jeans, MD, MS Assistant Professor of Neurology          [1] Past Medical History: Diagnosis Date  . Anxiety   . Breast lesion   . Hypertension   [2] Past Surgical History: Procedure Laterality Date  . BREAST LUMPECTOMY     Procedure: BREAST LUMPECTOMY  . CHOLECYSTECTOMY     Procedure: CHOLECYSTECTOMY  . TONSILLECTOMY     Procedure: TONSILLECTOMY  [3] Current Outpatient Medications on File Prior to Visit  Medication Sig Dispense Refill  . amLODIPine  (NORVASC ) 10 mg tablet Take 10 mg by mouth Once Daily.    . atenoloL  (TENORMIN ) 25 mg tablet     . busPIRone  (BUSPAR ) 15 mg tablet Take 15 mg by mouth 2 (two) times a day.    . multivitamin cap Take 1 tablet by mouth Once Daily.    . traZODone  (DESYREL ) 50 mg tablet Take 75 mg by mouth nightly.    . alendronate  (FOSAMAX ) 70 mg tablet TAKE 1 TABLET BY MOUTH ONCE A WEEK FIRST THING EVERY MORNING ON AN EMPTY STOMACH AT LEAST 30 MINUTES BEFORE TAKING OTHER MEDS.    . fluticasone  propionate (FLONASE ) 50 mcg/spray nasal spray Administer 1 spray into affected nostril(s). (Patient not taking: Reported on 09/14/2024)    . tamoxifen  (NOLVADEX ) 20 mg tablet 20 mg. (Patient not taking: Reported on 09/14/2024)    . trihexyphenidyL (ARTANE) 2 mg tablet Take 2 tabs in am and 2 tabs in pm 360 tablet 3   No current facility-administered medications on file prior to visit.  [4] Allergies Allergen Reactions  . Codeine Other (See Comments)    Cold sweats  [5] Social  History Tobacco Use  . Smoking status: Never  . Smokeless tobacco: Never  Substance Use Topics  . Alcohol use: Never  . Drug use: Never  [6] Family History Problem Relation Name Age of Onset  . Cancer Mother    . Cancer Maternal Aunt

## 2024-09-19 ENCOUNTER — Ambulatory Visit: Attending: Internal Medicine | Admitting: Internal Medicine

## 2024-09-19 ENCOUNTER — Encounter: Payer: Self-pay | Admitting: Internal Medicine

## 2024-09-19 VITALS — BP 118/70 | HR 70 | Temp 97.7°F | Ht 63.0 in | Wt 139.0 lb

## 2024-09-19 DIAGNOSIS — F419 Anxiety disorder, unspecified: Secondary | ICD-10-CM

## 2024-09-19 DIAGNOSIS — F5104 Psychophysiologic insomnia: Secondary | ICD-10-CM

## 2024-09-19 DIAGNOSIS — G259 Extrapyramidal and movement disorder, unspecified: Secondary | ICD-10-CM

## 2024-09-19 DIAGNOSIS — R413 Other amnesia: Secondary | ICD-10-CM

## 2024-09-19 DIAGNOSIS — R4789 Other speech disturbances: Secondary | ICD-10-CM

## 2024-09-19 MED ORDER — TRAZODONE HCL 50 MG PO TABS: 50.0000 mg | ORAL_TABLET | Freq: Every day | ORAL | 1 refills | Status: AC

## 2024-09-19 MED ORDER — MELATONIN 3 MG PO TABS: 3.0000 mg | ORAL_TABLET | Freq: Every day | ORAL | 0 refills | Status: AC

## 2024-09-19 MED ORDER — SERTRALINE HCL 25 MG PO TABS: 25.0000 mg | ORAL_TABLET | Freq: Every day | ORAL | 3 refills | Status: AC

## 2024-09-19 NOTE — Progress Notes (Signed)
 Patient ID: Kimberly [name], female    DOB: [birth year]  MRN: [id number]  CC: Neuropsychiatric Results (Discuss neuropsychiatric results Thompson artane dosage changes Thompson medications/Already received flu vax)   Subjective: Kimberly [name] is a 68 y.o. female who presents for discussion os neuropsychiatric eval results. Grand-daughter, Beayonie, is with her. She is recording our verbal conversation so that pt's daughter who is not able to be here can listen to it later.  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   Discussed the use of AI scribe software for clinical note transcription with the patient, who gave verbal consent to proceed.  History of Present Illness Kimberly [name] is a 68 year old female with cognitive decline and movement disorder who presents for follow-up of neuropsychiatric evaluation results. She is accompanied by her granddaughter, who is recording the visit for Kimberly [name]'s mother to listen to later.  She has been experiencing progressive memory decline over the years that I observed in my interactions with her. Her family has not observed her forgetting tasks such as leaving the stove on or any wondering behaviors. Pt states she sometimes forgets why she entered a room, and misplaces items like her cell phone. Grand-daughter endorses that she frequently repeats questions and struggles to follow movies, often asking the same questions repeatedly. However, she retains the ability to remember names and meals, and she does not wander off from the house. She continues to manage her on meds per her GD but pt does not recall names of meds.  Patient had neuropsychiatric evaluation done at [facility] by psychologist Jenna Renfroe.  She noted that patient remains partially independent from a functional standpoint but demonstrates  clear difficulty with medication management, organization skills and other complex tasks.  The etiology of her decline remains uncertain.  She stated that while her movement disorder and medication affects may play a contributing role, her overall presentation raised concern for neurodegenerative process possibly within the frontotemporal or Lewy body spectrum.  Other factors warranting consideration include her chronic use of psychiatric medications including BuSpar , trazodone  and possibly acting and chronic insomnia.  Some of her recommendations included speech and language evaluation to address dysarthria and word finding difficulties and potential expressive language deficits. She also recommended follow-up with neurology to clarify etiology and monitor progression.  Suggest further dementia workup and imaging. Of note patient had CAT scan of the head done in March of this year. In terms of socialization, patient tells me that she goes to church every Sunday.  Patient had follow-up appointment with movement disorder clinic [facility] on the third of this month.  In anticipation of that visit, I did speak with the PA there who had seen her and had sent a letter to the attending physician Dr. Hans expressing concern about her memory issue and wondering whether there was any connection between that and her movement disorder.  Patient states her daughter went with her but stayed out in the waiting room.  I have read Dr. Agnes note.  Diagnoses include dyskinesia, dystonia and spasmodic torticollis.  She decreased the dose of the Artane to 2 mg twice a day.  She also recommend that patient takes melatonin 3 to 5 mg at bedtime to help with chronic insomnia.       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 4 tablet 5   amLODipine  (NORVASC ) 10 MG tablet TAKE 1 TABLET(10 MG) BY MOUTH DAILY 90 tablet 1   atenolol  (TENORMIN ) 25 MG tablet TAKE 1 TABLET(25 MG) BY MOUTH DAILY 90 tablet 1   Blood Pressure Monitor DEVI Use as directed to check home blood pressure 2-3 times a week 1 Device 0   fluticasone  (FLONASE ) 50 MCG/ACT nasal spray Place 1 spray into both nostrils daily as needed for allergies or rhinitis. 16 g 6   Multiple Vitamin (MULTIVITAMIN WITH MINERALS) TABS tablet Take 1 tablet by mouth daily.      trihexyphenidyl (ARTANE) 2 MG tablet Take by mouth.     No current facility-administered medications on file prior to visit.    Allergies  Allergen Reactions   Codeine Other (See Comments)    Cold sweats    Social History   Socioeconomic History   Marital status: Single    Spouse name: Not on file   Number of children: 2   Years of education: Not on file   Highest education level: 12th grade  Occupational History   Not on file  Tobacco Use   Smoking status: Never    Passive exposure: Never   Smokeless  tobacco: Never  Vaping Use   Vaping status: Never Used  Substance and Sexual Activity   Alcohol use: No    Alcohol/week: 0.0 standard drinks of alcohol   Drug use: No   Sexual activity: Not Currently    Birth control/protection: Post-menopausal  Other Topics Concern   Not on file  Social History Narrative   Lives with daughter, 5 grandchildren   Social Drivers of Health   Financial Resource Strain: Low Risk  (12/14/2023)   Overall Financial Resource Strain (CARDIA)    Difficulty of Paying Living Expenses: Not hard at all  Food Insecurity: Food Insecurity Present (12/14/2023)   Hunger Vital Sign    Worried About Running Out of Food in the Last Year: Never true    Ran Out of Food in the Last Year: Often true  Transportation Needs: No Transportation Needs (12/14/2023)   PRAPARE - Administrator, Civil Service (Medical): No    Lack of Transportation (Non-Medical): No  Physical Activity: Insufficiently Active (12/14/2023)   Exercise Vital Sign    Days of Exercise per Week: 2 days    Minutes of Exercise per Session: 20 min  Stress:  Stress Concern Present (12/14/2023)   [employer] of Occupational Health - Occupational Stress Questionnaire    Feeling of Stress : To some extent  Social Connections: Moderately Integrated (12/14/2023)   Social Connection and Isolation Panel    Frequency of Communication with Friends and Family: More than three times a week    Frequency of Social Gatherings with Friends and Family: More than three times a week    Attends Religious Services: More than 4 times per year    Active Member of Clubs or Organizations: Yes    Attends Banker Meetings: More than 4 times per year    Marital Status: Never married  Intimate Partner Violence: Not At Risk (12/14/2023)   Humiliation, Afraid, Rape, and Kick questionnaire    Fear of Current or Ex-Partner: No    Emotionally Abused: No    Physically Abused: No    Sexually Abused: No    Family  History  Problem Relation Age of Onset   Breast cancer Mother 47   Cancer Maternal Grandmother        thinks it was braest, but not sure what type of cancer, dx >50   Heart disease Paternal Grandfather    Lung cancer Maternal Aunt    Colon cancer Neg Hx     Past Surgical History:  Procedure Laterality Date   APPENDECTOMY     bowel obstruction     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   BREAST LUMPECTOMY WITH RADIOACTIVE SEED LOCALIZATION Left 10/14/2018   Procedure: BREAST LUMPECTOMY WITH RADIOACTIVE SEED LOCALIZATION X2;  Surgeon: Vanderbilt Ned, MD;  Location: [facility] OR;  Service: General;  Laterality: Left;   CHOLECYSTECTOMY     COLONOSCOPY WITH PROPOFOL  N/A 08/05/2018   Procedure: COLONOSCOPY WITH PROPOFOL ;  Surgeon: Shaaron Lamar HERO, MD;  Location: [facility];  Service: Endoscopy;  Laterality: N/A;  12:45pm   TONSILLECTOMY      ROS: Review of Systems Negative except as stated above  PHYSICAL EXAM: BP 118/70 (BP Location: Left Arm, Patient Position: Sitting, Cuff Size: Normal)   Pulse 70   Temp 97.7 F (36.5 C) (Oral)   Ht 5' 3 (1.6 m)   Wt 139 lb (63 kg)   SpO2 97%   BMI 24.62 kg/m   Physical Exam  General appearance - alert, well appearing, and in no distress Mental status - normal mood, behavior, speech, dress, motor activity, and thought processes Neuro: Patient with lipsmacking with intermittent jerking movements of the head.  She is not as fidgety today    12/14/2023   12:03 PM 01/02/2021    3:55 PM  MMSE - Mini Mental State Exam  Orientation to time 4 5  Orientation to Place 5 5  Registration 3 3  Attention/ Calculation 4 5  Recall 2 3  Language- name 2 objects 2 2  Language- repeat 1 1  Language- follow 3 step command 2 3  Language- read & follow direction 1 1  Write a sentence 1 1  Copy design 1 1  Total score 26 30        Latest Ref Rng & Units 08/18/2024    4:46 PM 05/30/2024   12:00 AM 11/30/2023    3:06 PM  CMP  Glucose 70 - 99 mg/dL 84   91  81   BUN 8 - 27 mg/dL 12  10  15    Creatinine 0.57 - 1.00 mg/dL 9.21  9.24  9.25   Sodium 134 - 144 mmol/L  140  139  140   Potassium 3.5 - 5.2 mmol/L 3.5  4.3  4.4   Chloride 96 - 106 mmol/L 103  104  106   CO2 20 - 29 mmol/L 24  25  26    Calcium 8.7 - 10.3 mg/dL 89.8  89.8  9.6   Total Protein 6.0 - 8.5 g/dL 7.3   7.5   Total Bilirubin 0.0 - 1.2 mg/dL 0.3     Alkaline Phos 49 - 135 IU/L 55     AST 0 - 40 IU/L 19     ALT 0 - 32 IU/L 13      Lipid Panel     Component Value Date/Time   CHOL 174 08/18/2024 1646   TRIG 80 08/18/2024 1646   HDL 73 08/18/2024 1646   CHOLHDL 2.4 08/18/2024 1646   CHOLHDL 2.7 11/13/2016 1102   VLDL 25 11/13/2016 1102   LDLCALC 86 08/18/2024 1646    CBC    Component Value Date/Time   WBC 8.7 08/18/2024 1646   WBC 6.1 10/08/2018 0919   RBC 4.30 08/18/2024 1646   RBC 4.84 10/08/2018 0919   HGB 13.2 08/18/2024 1646   HCT 39.8 08/18/2024 1646   PLT 425 08/18/2024 1646   MCV 93 08/18/2024 1646   MCH 30.7 08/18/2024 1646   MCH 30.4 10/08/2018 0919   MCHC 33.2 08/18/2024 1646   MCHC 32.7 10/08/2018 0919   RDW 12.8 08/18/2024 1646   LYMPHSABS 1.9 10/08/2018 0919   MONOABS 0.6 10/08/2018 0919   EOSABS 0.2 10/08/2018 0919   BASOSABS 0.1 10/08/2018 0919    ASSESSMENT AND PLAN: 1. Movement disorder (Primary) Followed by movement disorder clinic at [facility]  Dyskinesia is not as prominent today as it was on last visit but still present.  Artane dose decreased by neurologist to 2 mg twice a day.  I tried to answer questions put forth by her granddaughter to the best of my knowledge including the fact that she was previously seen by Dr. Buck at [facility] who referred her to [facility]. - Ambulatory referral to Speech Therapy  2. Memory changes Patient with chronic progressive memory changes I appreciate the neuropsychiatric evaluation that was done and we will plan to have it repeated in about 12 to 18 months. Will screen for Alzheimer's with APO  E, we will also check vitamin B12 and TSH levels. Based on results, I may check back in with Dr. Buck to see her again more so for memory issues; may also try to catch up with Dr. Valdemar at [facility]. In regards to medications having any particular side effects, I recommend that he stop the BuSpar  and put her on low-dose of Zoloft  instead. Recommend decrease trazodone  to 50 mg daily and add the melatonin.  I am trying to avoid putting her on Ambien as it can cause mental confusion in elderly patients -Recommend avoiding social isolation.  It is good that she attends church every Sunday. -Recommend working with puzzles and word games.  Keep calendar in her room on the wall so that every day she can look at it and be oriented to day of the week and month. - APOE Alzheimer's Risk - Vitamin B12 - TSH  3. Chronic insomnia Decrease trazodone  from 75 mg daily to 50 mg daily. Prescription sent to her pharmacy for the melatonin 3 mg to take at bedtime. - traZODone  (DESYREL ) 50 MG tablet; Take 1 tablet (50 mg total) by mouth at bedtime. Dose decrease  Dispense: 90 tablet; Refill: 1 - melatonin 3 MG TABS tablet; Take 1 tablet (3 mg total) by mouth at bedtime.  Dispense: 100 tablet; Refill: 0  4. Chronic anxiety Stop BuSpar . Will try her with low-dose of Zoloft  instead to help with anxiety.  If she tolerates the 25 mg, we can titrate on future visit. - sertraline  (ZOLOFT ) 25 MG tablet; Take 1 tablet (25 mg total) by mouth daily.  Dispense: 30 tablet; Refill: 3  5. Word finding difficulty Advised patient that it would not hurt to see a speech therapist to see if it would be beneficial and she is agreeable to this. - Ambulatory referral to Speech Therapy    Patient and grand-daughter were given the opportunity to ask questions.    This documentation was completed using Paediatric nurse.  Any transcriptional errors are unintentional.  Orders Placed This Encounter  Procedures   APOE  Alzheimer's Risk   Vitamin B12   TSH   Ambulatory referral to Speech Therapy     Requested Prescriptions   Signed Prescriptions Disp Refills   traZODone  (DESYREL ) 50 MG tablet 90 tablet 1    Sig: Take 1 tablet (50 mg total) by mouth at bedtime. Dose decrease   sertraline  (ZOLOFT ) 25 MG tablet 30 tablet 3    Sig: Take 1 tablet (25 mg total) by mouth daily.   melatonin 3 MG TABS tablet 100 tablet 0    Sig: Take 1 tablet (3 mg total) by mouth at bedtime.    Return in about 7 weeks (around 11/07/2024) for f/u on memory.  Barnie Louder, MD, FACP

## 2024-09-19 NOTE — Patient Instructions (Signed)
  VISIT SUMMARY: Today, we discussed your ongoing cognitive decline, movement disorder, anxiety, sleep disturbances, bone health, high blood pressure, and prediabetes. We reviewed your neuropsychiatric evaluation results and made several adjustments to your treatment plan to better manage your symptoms.  YOUR PLAN: -COGNITIVE IMPAIRMENT: We went over the results of the neuropsychiatric evaluation you had done several weeks ago at [facility]. There was concern for possible onset of dementia verse effects of medications and chronic insomnia. We have ordered blood tests to screen for Alzheimer's dementia screening, referred you to a speech therapist for help with word finding and speech issues. I will send message to Dr. Buck, your previous neurologist here in [place] to see if she would be willing to see you regarding memory issues but will wait untel we get results back of blood test.   -DYSKINESIA AND DYSTONIA: You have been diagnosed with dyskinesia and dystonia, which are movement disorders causing involuntary movements and muscle tightness. The neurologist at [facility] has decreased the dose of Artane to 2 mg twice a day.  -ANXIETY DISORDER: Your anxiety medication, Buspar , has been discontinued due to its impact on your memory. We are starting you on Zoloft  at 25 mg daily, and you can increase the dose to 50 mg after 4 weeks if you tolerate it well.  -INSOMNIA: You have been experiencing sleep disturbances. We have adjusted your trazodone  dose to 50 mg at bedtime and recommend you start taking melatonin 3 mg at bedtime.   INSTRUCTIONS: Please follow up with the blood tests for Alzheimer's dementia screening, and attend the appointments with the speech therapist and neurologist as scheduled. Increase your Zoloft  dose to 50 mg after 4 weeks if you tolerate the initial dose well. Continue with your current medications for osteoporosis, hypertension, and prediabetes. Start  taking melatonin for your sleep disturbances. Follow up with neurology for your movement disorder.                      Contains text generated by Abridge.                                 Contains text generated by Abridge.

## 2024-09-20 ENCOUNTER — Ambulatory Visit: Payer: Self-pay | Admitting: Internal Medicine

## 2024-09-27 LAB — APOE ALZHEIMER'S DISEASE RISK

## 2024-09-27 LAB — TSH: TSH: 1.46 u[IU]/mL (ref 0.450–4.500)

## 2024-09-27 LAB — VITAMIN B12: Vitamin B-12: 723 pg/mL (ref 232–1245)

## 2024-09-28 NOTE — Telephone Encounter (Signed)
 Copied from CRM #[id number]. Topic: Clinical - Lab/Test Results >> Sep 28, 2024  9:13 AM Tonda B wrote:  Reason for CRM: pt wants to go over her lab results please call pt back [phone]

## 2024-10-11 ENCOUNTER — Other Ambulatory Visit: Payer: Self-pay | Admitting: Internal Medicine

## 2024-10-11 DIAGNOSIS — Z1231 Encounter for screening mammogram for malignant neoplasm of breast: Secondary | ICD-10-CM

## 2024-10-24 ENCOUNTER — Other Ambulatory Visit: Payer: Self-pay | Admitting: Internal Medicine

## 2024-10-24 DIAGNOSIS — I10 Essential (primary) hypertension: Secondary | ICD-10-CM

## 2024-11-02 ENCOUNTER — Ambulatory Visit
Admission: RE | Admit: 2024-11-02 | Discharge: 2024-11-02 | Disposition: A | Source: Ambulatory Visit | Attending: Internal Medicine | Admitting: Internal Medicine

## 2024-11-02 DIAGNOSIS — Z1231 Encounter for screening mammogram for malignant neoplasm of breast: Secondary | ICD-10-CM

## 2024-11-07 ENCOUNTER — Telehealth: Payer: Self-pay | Admitting: Internal Medicine

## 2024-11-07 ENCOUNTER — Ambulatory Visit: Admitting: Internal Medicine

## 2024-11-07 ENCOUNTER — Encounter: Payer: Self-pay | Admitting: Internal Medicine

## 2024-11-07 DIAGNOSIS — R419 Unspecified symptoms and signs involving cognitive functions and awareness: Secondary | ICD-10-CM

## 2024-11-07 DIAGNOSIS — F419 Anxiety disorder, unspecified: Secondary | ICD-10-CM | POA: Diagnosis not present

## 2024-11-07 NOTE — Addendum Note (Signed)
 Addended by: VICCI SOBER B on: 11/07/2024 03:37 PM   Modules accepted: Level of Service

## 2024-11-07 NOTE — Telephone Encounter (Signed)
 Contacted patient and left voicemail requesting to change todays appointment to virtual or to reschedule. No in-person appointments today due to weather. If patient calls back, please switch appointment accordingly.

## 2024-11-07 NOTE — Telephone Encounter (Signed)
 Contacted pt left vm to change appt (2nd attempt )

## 2024-11-07 NOTE — Progress Notes (Signed)
 Patient ID: Kimberly [name], female   DOB: 28-Nov-1955, 69 y.o.   MRN: [id number] Virtual Visit via Video Note  I connected with Kimberly [name] on 11/07/2024 at 2:25 PM by a video enabled telemedicine application and verified that I am speaking with the correct person using two identifiers.  Location: Patient: home Provider: Office   I discussed the limitations of evaluation and management by telemedicine and the availability of in person appointments. The patient expressed understanding and agreed to proceed.  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   Discussed the use of AI scribe software for clinical note transcription with the patient, who gave verbal consent to proceed.  History of Present Illness Kimberly [name] is a 69 year old female who presents for a six-week follow-up regarding memory issues.  She underwent a neuropsychiatric evaluation at [facility], which confirmed memory issue with concern for neurocognitive disorder that may be worsened by meds. Blood tests, including thyroid and vitamin B12 levels, were normal. APO-E test was negative. Her medication regimen was adjusted, discontinuing Buspar  and reducing trazodone  to 50 mg at bedtime. Zoloft  was introduced at 25 mg daily to manage anxiety. She notes a slight improvement in memory with med changes. Regarding anxiety management, Zoloft  is effective at the current dose and works better than Buspar , which she had been taking for a long time.  A referral was made to a speech therapist to assist with word-finding difficulties, but she has not yet been contacted for scheduling as yet.     Outpatient Encounter Medications as of  11/07/2024  Medication Sig   alendronate  (FOSAMAX ) 70 MG tablet TAKE 1 TABLET BY MOUTH ONCE A WEEK FIRST THING EVERY MORNING ON AN EMPTY STOMACH AT LEAST 30 MINUTES BEFORE TAKING OTHER MEDS.   amLODipine  (NORVASC ) 10 MG tablet TAKE 1 TABLET(10 MG) BY MOUTH DAILY   atenolol  (TENORMIN ) 25 MG tablet TAKE 1 TABLET(25 MG) BY MOUTH DAILY   Blood Pressure Monitor DEVI Use as directed to check home blood pressure 2-3 times a week   busPIRone  (BUSPAR ) 15 MG tablet TAKE 1 TABLET(15 MG) BY MOUTH TWICE DAILY   fluticasone  (FLONASE ) 50 MCG/ACT nasal spray Place 1 spray into both nostrils daily as needed for allergies or rhinitis.   melatonin 3 MG TABS tablet Take 1 tablet (3 mg total) by mouth at bedtime.   Multiple Vitamin (MULTIVITAMIN WITH MINERALS) TABS tablet Take 1 tablet by mouth daily.    sertraline  (ZOLOFT ) 25 MG tablet Take 1 tablet (25 mg total) by mouth daily.   traZODone  (DESYREL ) 50 MG tablet Take 1 tablet (50 mg total) by mouth at bedtime. Dose decrease   trihexyphenidyl (ARTANE) 2 MG tablet Take by mouth.   No facility-administered encounter medications on file as of 11/07/2024.      Observations/Objective: Elderly AAF in NAD.  Patient with word finding difficulty at times.  Assessment and Plan: 1. Neurocognitive disorder (Primary) Patient reporting some mild improvement with adjustment of her medications. Even though APO E was negative, I still suspect early dementia. I will still try to get her back with Dr. Buck the neurologist who had referred her to [facility] for the movement disorder. [facility] not really addressing memory issue - Message sent to referral coordinator inquiring about the referral  that was placed to speech therapy.  2. Chronic anxiety Doing well with Zoloft  instead of Buspar . Buspar  taken off her med list.   Follow Up Instructions: Change f/u to April   I discussed the assessment and treatment plan with the patient. The patient was provided an opportunity  to ask questions and all were answered. The patient agreed with the plan and demonstrated an understanding of the instructions.   The patient was advised to call back or seek an in-person evaluation if the symptoms worsen or if the condition fails to improve as anticipated.  I spent 16 minutes dedicated to the care of this patient on the date of this encounter to include previsit review of my last note. Face to face time with pt discussing dx and management.  This note has been created with Education officer, environmental. Any transcriptional errors are unintentional.  Barnie Louder, MD

## 2024-11-08 ENCOUNTER — Telehealth: Payer: Self-pay | Admitting: Internal Medicine

## 2024-11-08 NOTE — Telephone Encounter (Signed)
-----   Message from Barnie Louder, MD sent at 11/07/2024  3:29 PM EST ----- Please change f/u appt with me to mid April.

## 2024-11-08 NOTE — Telephone Encounter (Signed)
 Contacted patient and confirmed the appointment change. Follow-up appointment has been moved to mid-April per Dr. Ferdie request.

## 2024-11-11 ENCOUNTER — Ambulatory Visit: Payer: Self-pay | Admitting: Internal Medicine

## 2024-12-14 ENCOUNTER — Ambulatory Visit

## 2024-12-16 ENCOUNTER — Ambulatory Visit: Admitting: Internal Medicine

## 2025-01-26 ENCOUNTER — Ambulatory Visit: Admitting: Internal Medicine

## 2025-05-31 ENCOUNTER — Ambulatory Visit: Admitting: Endocrinology
